# Patient Record
Sex: Male | Born: 1940 | Race: White | Hispanic: No | Marital: Married | Smoking: Never smoker
Health system: Southern US, Community
[De-identification: ages and names within clinical notes are randomized; demographics above are authoritative.]

## PROBLEM LIST (undated history)

## (undated) DIAGNOSIS — R112 Nausea with vomiting, unspecified: Secondary | ICD-10-CM

## (undated) DIAGNOSIS — R51 Headache: Secondary | ICD-10-CM

## (undated) DIAGNOSIS — Z9889 Other specified postprocedural states: Secondary | ICD-10-CM

## (undated) DIAGNOSIS — R413 Other amnesia: Secondary | ICD-10-CM

## (undated) DIAGNOSIS — K219 Gastro-esophageal reflux disease without esophagitis: Secondary | ICD-10-CM

## (undated) DIAGNOSIS — M1990 Unspecified osteoarthritis, unspecified site: Secondary | ICD-10-CM

## (undated) DIAGNOSIS — I219 Acute myocardial infarction, unspecified: Secondary | ICD-10-CM

## (undated) DIAGNOSIS — E119 Type 2 diabetes mellitus without complications: Secondary | ICD-10-CM

## (undated) DIAGNOSIS — Z972 Presence of dental prosthetic device (complete) (partial): Secondary | ICD-10-CM

## (undated) DIAGNOSIS — E785 Hyperlipidemia, unspecified: Secondary | ICD-10-CM

## (undated) DIAGNOSIS — I10 Essential (primary) hypertension: Secondary | ICD-10-CM

## (undated) DIAGNOSIS — R519 Headache, unspecified: Secondary | ICD-10-CM

## (undated) DIAGNOSIS — I2510 Atherosclerotic heart disease of native coronary artery without angina pectoris: Secondary | ICD-10-CM

## (undated) DIAGNOSIS — F0390 Unspecified dementia without behavioral disturbance: Secondary | ICD-10-CM

## (undated) HISTORY — PX: TRIGGER FINGER RELEASE: SHX641

## (undated) HISTORY — PX: ABDOMINAL SURGERY: SHX537

## (undated) HISTORY — PX: JOINT REPLACEMENT: SHX530

## (undated) HISTORY — PX: OTHER SURGICAL HISTORY: SHX169

## (undated) HISTORY — PX: TONSILLECTOMY: SUR1361

---

## 1993-12-03 HISTORY — PX: GANGLION CYST EXCISION: SHX1691

## 2000-12-03 ENCOUNTER — Encounter: Payer: Self-pay | Admitting: Family Medicine

## 2001-12-03 ENCOUNTER — Encounter: Payer: Self-pay | Admitting: Family Medicine

## 2001-12-03 LAB — CONVERTED CEMR LAB
Hgb A1c MFr Bld: 6 %
Microalbumin U total vol: 1.6 mg/L

## 2003-02-01 ENCOUNTER — Encounter: Payer: Self-pay | Admitting: Family Medicine

## 2003-02-01 LAB — CONVERTED CEMR LAB: PSA: 0.4 ng/mL

## 2004-01-04 ENCOUNTER — Encounter: Payer: Self-pay | Admitting: Family Medicine

## 2004-04-02 ENCOUNTER — Encounter: Payer: Self-pay | Admitting: Family Medicine

## 2004-04-02 LAB — CONVERTED CEMR LAB
Hgb A1c MFr Bld: 6.9 %
PSA: 0.4 ng/mL

## 2004-10-17 ENCOUNTER — Ambulatory Visit: Payer: Self-pay | Admitting: Family Medicine

## 2004-10-19 ENCOUNTER — Ambulatory Visit: Payer: Self-pay | Admitting: Family Medicine

## 2004-12-25 ENCOUNTER — Ambulatory Visit: Payer: Self-pay | Admitting: Gastroenterology

## 2005-01-15 ENCOUNTER — Ambulatory Visit: Payer: Self-pay | Admitting: Gastroenterology

## 2005-04-04 ENCOUNTER — Ambulatory Visit: Payer: Self-pay | Admitting: Family Medicine

## 2006-01-31 ENCOUNTER — Ambulatory Visit: Payer: Self-pay | Admitting: Family Medicine

## 2006-03-11 ENCOUNTER — Ambulatory Visit: Payer: Self-pay | Admitting: Family Medicine

## 2006-03-15 ENCOUNTER — Ambulatory Visit: Payer: Self-pay | Admitting: Family Medicine

## 2006-04-02 ENCOUNTER — Encounter: Payer: Self-pay | Admitting: Family Medicine

## 2006-04-30 ENCOUNTER — Ambulatory Visit: Payer: Self-pay | Admitting: Family Medicine

## 2006-05-03 ENCOUNTER — Ambulatory Visit: Payer: Self-pay | Admitting: Family Medicine

## 2006-11-02 ENCOUNTER — Encounter: Payer: Self-pay | Admitting: Family Medicine

## 2006-11-02 LAB — CONVERTED CEMR LAB: Hgb A1c MFr Bld: 7.1 %

## 2006-11-06 ENCOUNTER — Ambulatory Visit: Payer: Self-pay | Admitting: Family Medicine

## 2006-11-06 LAB — CONVERTED CEMR LAB: PSA: 0.32 ng/mL

## 2006-11-08 ENCOUNTER — Ambulatory Visit: Payer: Self-pay | Admitting: Family Medicine

## 2006-12-20 ENCOUNTER — Ambulatory Visit: Payer: Self-pay | Admitting: Family Medicine

## 2007-02-07 ENCOUNTER — Ambulatory Visit: Payer: Self-pay | Admitting: Family Medicine

## 2007-03-10 ENCOUNTER — Ambulatory Visit: Payer: Self-pay | Admitting: Family Medicine

## 2007-03-10 LAB — CONVERTED CEMR LAB
Hgb A1c MFr Bld: 6.8 % — ABNORMAL HIGH (ref 4.6–6.0)
Total CHOL/HDL Ratio: 3.5
Triglycerides: 98 mg/dL (ref 0–149)
VLDL: 20 mg/dL (ref 0–40)

## 2007-03-12 ENCOUNTER — Ambulatory Visit: Payer: Self-pay | Admitting: Family Medicine

## 2007-06-04 ENCOUNTER — Encounter: Payer: Self-pay | Admitting: Family Medicine

## 2007-06-04 DIAGNOSIS — I10 Essential (primary) hypertension: Secondary | ICD-10-CM | POA: Insufficient documentation

## 2007-06-04 DIAGNOSIS — E1139 Type 2 diabetes mellitus with other diabetic ophthalmic complication: Secondary | ICD-10-CM

## 2007-06-04 DIAGNOSIS — E119 Type 2 diabetes mellitus without complications: Secondary | ICD-10-CM | POA: Insufficient documentation

## 2007-06-04 DIAGNOSIS — D6949 Other primary thrombocytopenia: Secondary | ICD-10-CM | POA: Insufficient documentation

## 2007-09-22 ENCOUNTER — Telehealth: Payer: Self-pay | Admitting: Family Medicine

## 2007-11-10 ENCOUNTER — Telehealth: Payer: Self-pay | Admitting: Family Medicine

## 2008-02-26 ENCOUNTER — Ambulatory Visit: Payer: Self-pay | Admitting: Family Medicine

## 2008-02-26 LAB — CONVERTED CEMR LAB
ALT: 34 U/L (ref 0–53)
AST: 27 U/L (ref 0–37)
Albumin: 3.7 g/dL (ref 3.5–5.2)
Alkaline Phosphatase: 50 U/L (ref 39–117)
BUN: 19 mg/dL (ref 6–23)
Basophils Absolute: 0 K/uL (ref 0.0–0.1)
Basophils Relative: 0.6 % (ref 0.0–1.0)
Bilirubin, Direct: 0.1 mg/dL (ref 0.0–0.3)
CO2: 30 meq/L (ref 19–32)
Calcium: 9.1 mg/dL (ref 8.4–10.5)
Chloride: 98 meq/L (ref 96–112)
Cholesterol: 140 mg/dL (ref 0–200)
Creatinine, Ser: 1 mg/dL (ref 0.4–1.5)
Creatinine,U: 152.7 mg/dL
Eosinophils Absolute: 0.2 K/uL (ref 0.0–0.6)
Eosinophils Relative: 4.2 % (ref 0.0–5.0)
GFR calc Af Amer: 96 mL/min
GFR calc non Af Amer: 79 mL/min
Glucose, Bld: 182 mg/dL — ABNORMAL HIGH (ref 70–99)
HCT: 41.3 % (ref 39.0–52.0)
HDL: 29.3 mg/dL — ABNORMAL LOW (ref >39.0)
Hemoglobin: 13.5 g/dL (ref 13.0–17.0)
Hgb A1c MFr Bld: 7.9 % — ABNORMAL HIGH (ref 4.6–6.0)
LDL Cholesterol: 77 mg/dL (ref 0–99)
Lymphocytes Relative: 21.5 % (ref 12.0–46.0)
MCHC: 32.6 g/dL (ref 30.0–36.0)
MCV: 81.7 fL (ref 78.0–100.0)
Microalb Creat Ratio: 19 mg/g (ref 0.0–30.0)
Microalb, Ur: 2.9 mg/dL — ABNORMAL HIGH (ref 0.0–1.9)
Monocytes Absolute: 0.3 K/uL (ref 0.2–0.7)
Monocytes Relative: 7.1 % (ref 3.0–11.0)
Neutro Abs: 2.9 K/uL (ref 1.4–7.7)
Neutrophils Relative %: 66.6 % (ref 43.0–77.0)
PSA: 0.43 ng/mL (ref 0.10–4.00)
Platelets: 114 K/uL — ABNORMAL LOW (ref 150–400)
Potassium: 4.7 meq/L (ref 3.5–5.1)
RBC: 5.06 M/uL (ref 4.22–5.81)
RDW: 13.9 % (ref 11.5–14.6)
Sodium: 133 meq/L — ABNORMAL LOW (ref 135–145)
TSH: 2.81 u[IU]/mL (ref 0.35–5.50)
Total Bilirubin: 0.6 mg/dL (ref 0.3–1.2)
Total CHOL/HDL Ratio: 4.8
Total Protein: 7.1 g/dL (ref 6.0–8.3)
Triglycerides: 169 mg/dL — ABNORMAL HIGH (ref 0–149)
VLDL: 34 mg/dL (ref 0–40)
WBC: 4.3 10*3/microliter — ABNORMAL LOW (ref 4.5–10.5)

## 2008-03-02 ENCOUNTER — Ambulatory Visit: Payer: Self-pay | Admitting: Family Medicine

## 2008-04-06 ENCOUNTER — Telehealth: Payer: Self-pay | Admitting: Family Medicine

## 2008-04-29 ENCOUNTER — Encounter: Payer: Self-pay | Admitting: Family Medicine

## 2008-09-01 ENCOUNTER — Ambulatory Visit: Payer: Self-pay | Admitting: Family Medicine

## 2008-09-01 LAB — CONVERTED CEMR LAB
Albumin: 3.9 g/dL (ref 3.5–5.2)
Alkaline Phosphatase: 76 units/L (ref 39–117)
Bilirubin, Direct: 0.1 mg/dL (ref 0.0–0.3)
Eosinophils Absolute: 0.2 10*3/uL (ref 0.0–0.7)
GFR calc Af Amer: 96 mL/min
GFR calc non Af Amer: 79 mL/min
HCT: 41.1 % (ref 39.0–52.0)
HDL: 23.7 mg/dL — ABNORMAL LOW (ref >39.0)
Hgb A1c MFr Bld: 10.7 % — ABNORMAL HIGH (ref 4.6–6.0)
MCV: 78.5 fL (ref 78.0–100.0)
Microalb Creat Ratio: 2.9 mg/g (ref 0.0–30.0)
Monocytes Absolute: 0.3 10*3/uL (ref 0.1–1.0)
Platelets: 99 10*3/uL — ABNORMAL LOW (ref 150–400)
Potassium: 4.7 meq/L (ref 3.5–5.1)
RDW: 13.8 % (ref 11.5–14.6)
Sodium: 133 meq/L — ABNORMAL LOW (ref 135–145)
TSH: 2.88 microintl units/mL (ref 0.35–5.50)
Triglycerides: 194 mg/dL — ABNORMAL HIGH (ref 0–149)

## 2008-09-06 ENCOUNTER — Ambulatory Visit: Payer: Self-pay | Admitting: Family Medicine

## 2008-10-07 ENCOUNTER — Ambulatory Visit: Payer: Self-pay | Admitting: Family Medicine

## 2008-10-21 ENCOUNTER — Ambulatory Visit: Payer: Self-pay | Admitting: Family Medicine

## 2008-10-21 DIAGNOSIS — M25569 Pain in unspecified knee: Secondary | ICD-10-CM

## 2009-01-13 ENCOUNTER — Ambulatory Visit: Payer: Self-pay | Admitting: Family Medicine

## 2009-01-13 LAB — CONVERTED CEMR LAB: Hgb A1c MFr Bld: 7.2 % — ABNORMAL HIGH (ref 4.6–6.0)

## 2009-01-20 ENCOUNTER — Ambulatory Visit: Payer: Self-pay | Admitting: Family Medicine

## 2009-02-17 ENCOUNTER — Ambulatory Visit: Payer: Self-pay | Admitting: Family Medicine

## 2009-03-08 ENCOUNTER — Encounter: Payer: Self-pay | Admitting: Family Medicine

## 2009-04-04 ENCOUNTER — Telehealth: Payer: Self-pay | Admitting: Family Medicine

## 2009-04-21 ENCOUNTER — Encounter: Payer: Self-pay | Admitting: Family Medicine

## 2009-10-07 ENCOUNTER — Encounter (INDEPENDENT_AMBULATORY_CARE_PROVIDER_SITE_OTHER): Payer: Self-pay | Admitting: *Deleted

## 2009-12-13 ENCOUNTER — Encounter (INDEPENDENT_AMBULATORY_CARE_PROVIDER_SITE_OTHER): Payer: Self-pay | Admitting: *Deleted

## 2010-06-29 ENCOUNTER — Telehealth (INDEPENDENT_AMBULATORY_CARE_PROVIDER_SITE_OTHER): Payer: Self-pay | Admitting: *Deleted

## 2010-07-11 ENCOUNTER — Encounter (INDEPENDENT_AMBULATORY_CARE_PROVIDER_SITE_OTHER): Payer: Self-pay | Admitting: *Deleted

## 2010-07-12 ENCOUNTER — Ambulatory Visit: Payer: Self-pay | Admitting: Gastroenterology
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
## 2011-01-02 NOTE — Letter (Signed)
Summary: Nadara Eaton letter  [facility]  [address] [place], [place] [address]   Phone: [phone]  Fax: [phone]       07/11/2010 MRN: [id number]  Daniel Sherman [address] [place], [place]  [address]  Dear Mr. [name] [place] [place] announce the retirement of Arta Silence, M.D., from full-time practice at the [facility] office effective June 01, 2010 and his plans of returning part-time.  It is important to Dr. Hetty Ely and to our practice that you understand that [facility] has seven physicians in our office for your health care needs.  We will continue to offer the same exceptional care that you have today.    Dr. Hetty Ely has spoken to many of you about his plans for retirement and returning part-time in the fall.   We will continue to work with you through the transition to schedule appointments for you in the office and meet the high standards that [place] is committed to.   Again, it is with great pleasure that we share the news that Dr. Hetty Ely will return to [facility] in October of 2011 with a reduced schedule.    If you have any questions, or would like to request an appointment with one of our physicians, please call us at [phone] and press the option for Scheduling an appointment.  We take pleasure in providing you with excellent patient care and look forward to seeing you at your next office visit.  Our [facility] Physicians are:  Tillman Abide, M.D. Laurita Quint, M.D. Roxy Manns, M.D. Kerby Nora, M.D. Hannah Beat, M.D. Ruthe Mannan, M.D. We proudly welcomed Raechel Ache, M.D. and Eustaquio Boyden, M.D. to the practice in July/August 2011.  Sincerely,   [facility]

## 2011-01-02 NOTE — Letter (Signed)
Summary: Colonoscopy Letter   Gastroenterology  [address] [place], [place] [address]   Phone: [phone]  Fax: [phone]      December 13, 2009 MRN: [id number]   Daniel Sherman [address] [place], [place]  [address]   Dear Mr. Pinera,   According to your medical record, it is time for you to schedule a Colonoscopy. The American Cancer Society recommends this procedure as a method to detect early colon cancer. Patients with a family history of colon cancer, or a personal history of colon polyps or inflammatory bowel disease are at increased risk.  This letter has beeen generated based on the recommendations made at the time of your procedure. If you feel that in your particular situation this may no longer apply, please contact our office.  Please call our office at [phone] to schedule this appointment or to update your records at your earliest convenience.  Thank you for cooperating with [place] to provide you with the very best care possible.   Sincerely,  Judie Petit T. Russella Dar, M.D.  [facility]astro[facility] [phone]

## 2011-01-02 NOTE — Assessment & Plan Note (Signed)
Summary: 2 WEEK FOLLOW UP/[facility]   Vital Signs:  Patient Profile:   70 Years Old Male Height:     66.5 inches Weight:      236 pounds Temp:     97.9 degrees F oral Pulse rate:   88 / minute Pulse rhythm:   regular BP sitting:   124 / 60  (left arm) Cuff size:   large  Vitals Entered By: Providence Crosby (October 21, 2008 8:59 AM)                 Chief Complaint:  2 week followup.  History of Present Illness: Here for two  week followup to check again diabetes after starting insulin. $ day progression of sugars show variation which he realizes is from varied dietary intake and he has a pretty good idea what he should be doing...just needs to be more aggressive with practicing it. He has lost weight and generally feels better as well as has noticed his BP is better. He has been having some left knee pain, off and on...typically in AM. Otherwise feels well.    Prior Medications Reviewed Using: Patient Recall  Current Allergies (reviewed today): TYLENOL (ACETAMINOPHEN)      Physical Exam  General:     Well-developed,well-nourished,in no acute distress; alert,appropriate and cooperative throughout examination Head:     Normocephalic and atraumatic without obvious abnormalities. No apparent alopecia or balding. Eyes:     Conjunctiva clear bilaterally.  Ears:     External ear exam shows no significant lesions or deformities.  Otoscopic examination reveals clear canals, tympanic membranes are intact bilaterally without bulging, retraction, inflammation or discharge. Hearing is grossly normal bilaterally. Nose:     External nasal examination shows no deformity or inflammation. Nasal mucosa are pink and moist without lesions or exudates. Mouth:     Oral mucosa and oropharynx without lesions or exudates.  Teeth in good repair. Neck:     No deformities, masses, or tenderness noted. Chest Wall:     No deformities, masses, tenderness or gynecomastia noted. Lungs:     Normal  respiratory effort, chest expands symmetrically. Lungs are clear to auscultation, no crackles or wheezes. Heart:     Normal rate and regular rhythm. S1 and S2 normal without gallop, murmur, click, rub or other extra sounds. Msk:     L knee with mild discomfort prox patellar area. Unable to reproduce on exam. No swelling, erythema or warmth.    Impression & Recommendations:  Problem # 1:  DIABETES MELLITUS, TYPE II (ICD-250.00) Assessment: Improved Diong better. Try to be more aggressive with diet and continue insulin at current dose. His updated medication list for this problem includes:    Metformin Hcl 1000 Mg Tabs (Metformin hcl) .[name]... 1 tab in am, 1/2 tab at night.    Lisinopril 40 Mg Tabs (Lisinopril) .[name]... Take 1 tablet by mouth every night    Aspirin Ec 325 Mg Tbec (Aspirin) .[name]... Take one by mouth daily    Humulin 70/30 70-30 % Susp (Insulin isophane & regular) .Marland KitchenMarland KitchenMarland KitchenMarland Kitchen 10units subcutaneously bid  Labs Reviewed: HgBA1c: 10.7 (09/01/2008)   Creat: 1.0 (09/01/2008)   Microalbumin: 0.4 (09/01/2008)  Last Eye Exam: diabetic retinopathy (04/29/2008)   Problem # 2:  HYPERTENSION (ICD-401.9) Assessment: Unchanged Stable and well comntrolled....continue. His updated medication list for this problem includes:    Hydrochlorothiazide 12.5 Mg Tabs (Hydrochlorothiazide) .[name]... Take 1 tablet by mouth every morning    Lisinopril 40 Mg Tabs (Lisinopril) .[name]... Take 1 tablet by  mouth every night  BP today: 124/60 Prior BP: 120/60 (10/07/2008)  Labs Reviewed: Creat: 1.0 (09/01/2008) Chol: 100 (09/01/2008)   HDL: 23.7 (09/01/2008)   LDL: 38 (09/01/2008)   TG: 194 (09/01/2008)   Problem # 3:  KNEE PAIN, LEFT (ZOX-096.04) Assessment: New Prob patellar costochondritis....try SLRs as discussed. His updated medication list for this problem includes:    Aspirin Ec 325 Mg Tbec (Aspirin) .[name]... Take one by mouth daily   Complete Medication List: 1)  Hydrochlorothiazide 12.5 Mg Tabs  (Hydrochlorothiazide) .... Take 1 tablet by mouth every morning 2)  Metformin Hcl 1000 Mg Tabs (Metformin hcl) .[name].. 1 tab in am, 1/2 tab at night. 3)  Lisinopril 40 Mg Tabs (Lisinopril) .... Take 1 tablet by mouth every night 4)  Amitriptyline Hcl 50 Mg Tabs (Amitriptyline hcl) .[name].. 1 tablet at bedtime by mouth 5)  Simvastatin 80 Mg Tabs (Simvastatin) .... Take one by mouth daily 6)  Aspirin Ec 325 Mg Tbec (Aspirin) .... Take one by mouth daily 7)  Humulin 70/30 70-30 % Susp (Insulin isophane & regular) .[name].. 10units subcutaneously bid 8)  Onetouch Test Strp (Glucose blood) .... Check daily and as needed   Patient Instructions: 1)  RTC 3 mos, A1C prior   ]

## 2011-01-02 NOTE — Progress Notes (Signed)
Summary: Records  Phone Note Other Incoming   Request: Send information Summary of Call: Request for records received from [facility]. Request forwarded to [facility].   Follow-up for Phone Call        Noted Follow-up by: Christie Nottingham CMA Duncan Dull),  June 29, 2010 10:56 AM

## 2011-02-17 ENCOUNTER — Ambulatory Visit: Payer: Self-pay | Admitting: Specialist

## 2013-11-23 ENCOUNTER — Encounter: Payer: Self-pay | Admitting: Specialist

## 2013-11-24 ENCOUNTER — Ambulatory Visit: Payer: Self-pay | Admitting: Unknown Physician Specialty

## 2013-12-03 ENCOUNTER — Encounter: Payer: Self-pay | Admitting: Specialist

## 2013-12-30 ENCOUNTER — Ambulatory Visit: Payer: Self-pay | Admitting: Gastroenterology

## 2014-01-08 ENCOUNTER — Ambulatory Visit: Payer: Self-pay | Admitting: Gastroenterology

## 2014-06-09 ENCOUNTER — Ambulatory Visit: Payer: Self-pay | Admitting: Gastroenterology

## 2014-06-11 DIAGNOSIS — R0789 Other chest pain: Secondary | ICD-10-CM | POA: Insufficient documentation

## 2014-06-22 ENCOUNTER — Ambulatory Visit: Payer: Self-pay | Admitting: Gastroenterology

## 2014-06-22 HISTORY — PX: COLONOSCOPY WITH ESOPHAGOGASTRODUODENOSCOPY (EGD): SHX5779

## 2014-06-24 LAB — PATHOLOGY REPORT
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
## 2015-02-08 NOTE — Patient Instructions (Addendum)
Sanjiv Castore[facility]  02/08/2015   Your procedure is scheduled on:    02/15/2015    Report to [facility] Main  Entrance and follow signs to               [place] at      0830 AM.  Call this number if you have problems the morning of surgery [phone]   Remember: Eat a good healthy snack prior to bedtime.   Do not eat food or drink liquids :After Midnight.     Take these medicines the morning of surgery with A SIP OF WATER: Prilosec               Take 1/2 of evening dose of Insulin nite before surgery.                                 You may not have any metal on your body including hair pins and              piercings  Do not wear jewelry,  lotions, powders or perfumes., deodorant.                            Men may shave face and neck.   Do not bring valuables to the hospital. [place].  Contacts, dentures or bridgework may not be worn into surgery.  Leave suitcase in the car. After surgery it may be brought to your room.         Special Instructions: coughing and deep breathing exercises, leg exercises               Please read over the following fact sheets you were given: _____________________________________________________________________             [facility] - Preparing for Surgery Before surgery, you can play an important role.  Because skin is not sterile, your skin needs to be as free of germs as possible.  You can reduce the number of germs on your skin by washing with CHG (chlorahexidine gluconate) soap before surgery.  CHG is an antiseptic cleaner which kills germs and bonds with the skin to continue killing germs even after washing. Please DO NOT use if you have an allergy to CHG or antibacterial soaps.  If your skin becomes reddened/irritated stop using the CHG and inform your nurse when you arrive at Short Stay. Do not shave (including legs and underarms) for at least 48 hours prior  to the first CHG shower.  You may shave your face/neck. Please follow these instructions carefully:  1.  Shower with CHG Soap the night before surgery and the  morning of Surgery.  2.  If you choose to wash your hair, wash your hair first as usual with your  normal  shampoo.  3.  After you shampoo, rinse your hair and body thoroughly to remove the  shampoo.                           4.  Use CHG as you would any other liquid soap.  You can apply chg directly  to the skin and wash  Gently with a scrungie or clean washcloth.  5.  Apply the CHG Soap to your body ONLY FROM THE NECK DOWN.   Do not use on face/ open                           Wound or open sores. Avoid contact with eyes, ears mouth and genitals (private parts).                       Wash face,  Genitals (private parts) with your normal soap.             6.  Wash thoroughly, paying special attention to the area where your surgery  will be performed.  7.  Thoroughly rinse your body with warm water from the neck down.  8.  DO NOT shower/wash with your normal soap after using and rinsing off  the CHG Soap.                9.  Pat yourself dry with a clean towel.            10.  Wear clean pajamas.            11.  Place clean sheets on your bed the night of your first shower and do not  sleep with pets. Day of Surgery : Do not apply any lotions/deodorants the morning of surgery.  Please wear clean clothes to the hospital/surgery center.  FAILURE TO FOLLOW THESE INSTRUCTIONS MAY RESULT IN THE CANCELLATION OF YOUR SURGERY PATIENT SIGNATURE_________________________________  NURSE SIGNATURE__________________________________  ________________________________________________________________________  WHAT IS A BLOOD TRANSFUSION? Blood Transfusion Information  A transfusion is the replacement of blood or some of its parts. Blood is made up of multiple cells which provide different functions.  Red blood cells carry oxygen  and are used for blood loss replacement.  White blood cells fight against infection.  Platelets control bleeding.  Plasma helps clot blood.  Other blood products are available for specialized needs, such as hemophilia or other clotting disorders. BEFORE THE TRANSFUSION  Who gives blood for transfusions?   Healthy volunteers who are fully evaluated to make sure their blood is safe. This is blood bank blood. Transfusion therapy is the safest it has ever been in the practice of medicine. Before blood is taken from a donor, a complete history is taken to make sure that person has no history of diseases nor engages in risky social behavior (examples are intravenous drug use or sexual activity with multiple partners). The donor's travel history is screened to minimize risk of transmitting infections, such as malaria. The donated blood is tested for signs of infectious diseases, such as HIV and hepatitis. The blood is then tested to be sure it is compatible with you in order to minimize the chance of a transfusion reaction. If you or a relative donates blood, this is often done in anticipation of surgery and is not appropriate for emergency situations. It takes many days to process the donated blood. RISKS AND COMPLICATIONS Although transfusion therapy is very safe and saves many lives, the main dangers of transfusion include:  1. Getting an infectious disease. 2. Developing a transfusion reaction. This is an allergic reaction to something in the blood you were given. Every precaution is taken to prevent this. The decision to have a blood transfusion has been considered carefully by your caregiver before blood is given. Blood is not given unless the benefits outweigh  the risks. AFTER THE TRANSFUSION  Right after receiving a blood transfusion, you will usually feel much better and more energetic. This is especially true if your red blood cells have gotten low (anemic). The transfusion raises the level  of the red blood cells which carry oxygen, and this usually causes an energy increase.  The nurse administering the transfusion will monitor you carefully for complications. HOME CARE INSTRUCTIONS  No special instructions are needed after a transfusion. You may find your energy is better. Speak with your caregiver about any limitations on activity for underlying diseases you may have. SEEK MEDICAL CARE IF:   Your condition is not improving after your transfusion.  You develop redness or irritation at the intravenous (IV) site. SEEK IMMEDIATE MEDICAL CARE IF:  Any of the following symptoms occur over the next 12 hours:  Shaking chills.  You have a temperature by mouth above 102 F (38.9 C), not controlled by medicine.  Chest, back, or muscle pain.  People around you feel you are not acting correctly or are confused.  Shortness of breath or difficulty breathing.  Dizziness and fainting.  You get a rash or develop hives.  You have a decrease in urine output.  Your urine turns a dark color or changes to pink, red, or brown. Any of the following symptoms occur over the next 10 days:  You have a temperature by mouth above 102 F (38.9 C), not controlled by medicine.  Shortness of breath.  Weakness after normal activity.  The white part of the eye turns yellow (jaundice).  You have a decrease in the amount of urine or are urinating less often.  Your urine turns a dark color or changes to pink, red, or brown. Document Released: 11/16/2000 Document Revised: 02/11/2012 Document Reviewed: 07/05/2008 ExitCare Patient Information 2014 [place].  _______________________________________________________________________  Incentive Spirometer  An incentive spirometer is a tool that can help keep your lungs clear and active. This tool measures how well you are filling your lungs with each breath. Taking long deep breaths may help reverse or decrease the chance of developing  breathing (pulmonary) problems (especially infection) following:  A long period of time when you are unable to move or be active. BEFORE THE PROCEDURE   If the spirometer includes an indicator to show your best effort, your nurse or respiratory therapist will set it to a desired goal.  If possible, sit up straight or lean slightly forward. Try not to slouch.  Hold the incentive spirometer in an upright position. INSTRUCTIONS FOR USE  3. Sit on the edge of your bed if possible, or sit up as far as you can in bed or on a chair. 4. Hold the incentive spirometer in an upright position. 5. Breathe out normally. 6. Place the mouthpiece in your mouth and seal your lips tightly around it. 7. Breathe in slowly and as deeply as possible, raising the piston or the ball toward the top of the column. 8. Hold your breath for 3-5 seconds or for as long as possible. Allow the piston or ball to fall to the bottom of the column. 9. Remove the mouthpiece from your mouth and breathe out normally. 10. Rest for a few seconds and repeat Steps 1 through 7 at least 10 times every 1-2 hours when you are awake. Take your time and take a few normal breaths between deep breaths. 11. The spirometer may include an indicator to show your best effort. Use the indicator as a goal to work  toward during each repetition. 12. After each set of 10 deep breaths, practice coughing to be sure your lungs are clear. If you have an incision (the cut made at the time of surgery), support your incision when coughing by placing a pillow or rolled up towels firmly against it. Once you are able to get out of bed, walk around indoors and cough well. You may stop using the incentive spirometer when instructed by your caregiver.  RISKS AND COMPLICATIONS  Take your time so you do not get dizzy or light-headed.  If you are in pain, you may need to take or ask for pain medication before doing incentive spirometry. It is harder to take a deep  breath if you are having pain. AFTER USE  Rest and breathe slowly and easily.  It can be helpful to keep track of a log of your progress. Your caregiver can provide you with a simple table to help with this. If you are using the spirometer at home, follow these instructions: [place] IF:   You are having difficultly using the spirometer.  You have trouble using the spirometer as often as instructed.  Your pain medication is not giving enough relief while using the spirometer.  You develop fever of 100.5 F (38.1 C) or higher. SEEK IMMEDIATE MEDICAL CARE IF:   You cough up bloody sputum that had not been present before.  You develop fever of 102 F (38.9 C) or greater.  You develop worsening pain at or near the incision site. MAKE SURE YOU:   Understand these instructions.  Will watch your condition.  Will get help right away if you are not doing well or get worse. Document Released: 04/01/2007 Document Revised: 02/11/2012 Document Reviewed: 06/02/2007 [facility] Patient Information 2014 [place], [place].   ________________________________________________________________________

## 2015-02-09 ENCOUNTER — Encounter (HOSPITAL_COMMUNITY): Payer: Self-pay

## 2015-02-09 ENCOUNTER — Encounter (HOSPITAL_COMMUNITY)
Admission: RE | Admit: 2015-02-09 | Discharge: 2015-02-09 | Disposition: A | Discharge status: Home / Self Care | Payer: Medicare PPO | Source: Ambulatory Visit | Attending: Orthopedic Surgery | Admitting: Orthopedic Surgery

## 2015-02-09 DIAGNOSIS — M1612 Unilateral primary osteoarthritis, left hip: Secondary | ICD-10-CM | POA: Diagnosis not present

## 2015-02-09 DIAGNOSIS — Z01818 Encounter for other preprocedural examination: Secondary | ICD-10-CM | POA: Insufficient documentation

## 2015-02-09 HISTORY — DX: Gastro-esophageal reflux disease without esophagitis: K21.9

## 2015-02-09 HISTORY — DX: Acute myocardial infarction, unspecified: I21.9

## 2015-02-09 HISTORY — DX: Other specified postprocedural states: Z98.890

## 2015-02-09 HISTORY — DX: Unspecified osteoarthritis, unspecified site: M19.90

## 2015-02-09 HISTORY — DX: Other specified postprocedural states: R11.2

## 2015-02-09 HISTORY — DX: Type 2 diabetes mellitus without complications: E11.9

## 2015-02-09 HISTORY — DX: Essential (primary) hypertension: I10

## 2015-02-09 LAB — SURGICAL PCR SCREEN
MRSA, PCR: NEGATIVE
Staphylococcus aureus: POSITIVE — AB

## 2015-02-09 LAB — URINALYSIS, ROUTINE W REFLEX MICROSCOPIC
Glucose, UA: NEGATIVE mg/dL
Hgb urine dipstick: NEGATIVE
Ketones, ur: NEGATIVE mg/dL
NITRITE: NEGATIVE
Protein, ur: NEGATIVE mg/dL
SPECIFIC GRAVITY, URINE: 1.019 (ref 1.005–1.030)
Urobilinogen, UA: 1 mg/dL (ref 0.0–1.0)
pH: 5.5 (ref 5.0–8.0)

## 2015-02-09 LAB — CBC
HCT: 42.4 % (ref 39.0–52.0)
Hemoglobin: 13.9 g/dL (ref 13.0–17.0)
MCH: 27.2 pg (ref 26.0–34.0)
MCHC: 32.8 g/dL (ref 30.0–36.0)
MCV: 83 fL (ref 78.0–100.0)
PLATELETS: 105 10*3/uL — AB (ref 150–400)
RBC: 5.11 MIL/uL (ref 4.22–5.81)
RDW: 14.2 % (ref 11.5–15.5)
WBC: 5.5 10*3/uL (ref 4.0–10.5)

## 2015-02-09 LAB — BASIC METABOLIC PANEL
Anion gap: 9 (ref 5–15)
BUN: 23 mg/dL (ref 6–23)
CO2: 27 mmol/L (ref 19–32)
CREATININE: 1.35 mg/dL (ref 0.50–1.35)
Calcium: 9.1 mg/dL (ref 8.4–10.5)
Chloride: 100 mmol/L (ref 96–112)
GFR calc Af Amer: 59 mL/min — ABNORMAL LOW (ref >90)
GFR calc non Af Amer: 50 mL/min — ABNORMAL LOW (ref >90)
Glucose, Bld: 103 mg/dL — ABNORMAL HIGH (ref 70–99)
Potassium: 4.5 mmol/L (ref 3.5–5.1)
Sodium: 136 mmol/L (ref 135–145)

## 2015-02-09 LAB — APTT: aPTT: 33 seconds (ref 24–37)

## 2015-02-09 LAB — PROTIME-INR
INR: 0.96 (ref 0.00–1.49)
Prothrombin Time: 12.9 seconds (ref 11.6–15.2)

## 2015-02-09 LAB — URINE MICROSCOPIC-ADD ON

## 2015-02-09 LAB — NO BLOOD PRODUCTS

## 2015-02-09 NOTE — Progress Notes (Signed)
Blood Product Refusal placed under FYI in EPIC  Blood Product Refusal Consent faxed to Blood bank and confirmation received.  Blood Product Refusal placed under Allergies in EPIC.   Blood Product Refusal Consent along with Health Care Directive faxed to Dr [name] and confirmation received.

## 2015-02-09 NOTE — Progress Notes (Signed)
Dr Kary Kos- 01/31/15 clearance on chart  LOV 01/31/15 on chart  DDR Bartholome Bill- 01/27/2015 clearance on chart  EKG- 06/11/2014 on chart  Stress Test 06/23/2014 on chart

## 2015-02-09 NOTE — Progress Notes (Signed)
CBC, U/A and micro results done 02/09/2015 faxed via EPIC to Dr Alvan Dame.

## 2015-02-14 NOTE — Anesthesia Preprocedure Evaluation (Addendum)
Anesthesia Evaluation  Patient identified by MRN, date of birth, ID band Patient awake    Reviewed: Allergy & Precautions, NPO status , Patient's Chart, lab work & pertinent test results  History of Anesthesia Complications (+) PONV and history of anesthetic complications  Airway Mallampati: I  TM Distance: >3 FB Neck ROM: Full    Dental  (+) Edentulous Upper, Edentulous Lower   Pulmonary neg pulmonary ROS,    Pulmonary exam normal       Cardiovascular hypertension, + Past MI     Neuro/Psych negative neurological ROS  negative psych ROS   GI/Hepatic Neg liver ROS, GERD-  Medicated,  Endo/Other  diabetes  Renal/GU negative Renal ROS     Musculoskeletal  (+) Arthritis -,   Abdominal   Peds  Hematology   Anesthesia Other Findings   Reproductive/Obstetrics                           Anesthesia Physical Anesthesia Plan  ASA: III  Anesthesia Plan: MAC and Spinal   Post-op Pain Management:    Induction:   Airway Management Planned:   Additional Equipment:   Intra-op Plan:   Post-operative Plan:   Informed Consent: I have reviewed the patients History and Physical, chart, labs and discussed the procedure including the risks, benefits and alternatives for the proposed anesthesia with the patient or authorized representative who has indicated his/her understanding and acceptance.     Plan Discussed with: CRNA, Anesthesiologist and Surgeon  Anesthesia Plan Comments:        Anesthesia Quick Evaluation

## 2015-02-15 ENCOUNTER — Inpatient Hospital Stay (HOSPITAL_COMMUNITY): Payer: Medicare PPO | Admitting: Anesthesiology

## 2015-02-15 ENCOUNTER — Inpatient Hospital Stay (HOSPITAL_COMMUNITY): Payer: Medicare PPO

## 2015-02-15 ENCOUNTER — Encounter (HOSPITAL_COMMUNITY)
Admission: RE | Disposition: A | Discharge status: Home Health | Payer: Self-pay | Source: Ambulatory Visit | Attending: Orthopedic Surgery

## 2015-02-15 ENCOUNTER — Encounter (HOSPITAL_COMMUNITY): Payer: Self-pay | Admitting: Anesthesiology

## 2015-02-15 ENCOUNTER — Inpatient Hospital Stay (HOSPITAL_COMMUNITY)
Admission: RE | Admit: 2015-02-15 | Discharge: 2015-02-16 | DRG: 470 | Disposition: A | Discharge status: Home Health | Payer: Medicare PPO | Source: Ambulatory Visit | Attending: Orthopedic Surgery | Admitting: Orthopedic Surgery

## 2015-02-15 DIAGNOSIS — I10 Essential (primary) hypertension: Secondary | ICD-10-CM | POA: Diagnosis present

## 2015-02-15 DIAGNOSIS — K219 Gastro-esophageal reflux disease without esophagitis: Secondary | ICD-10-CM | POA: Diagnosis present

## 2015-02-15 DIAGNOSIS — Z6833 Body mass index (BMI) 33.0-33.9, adult: Secondary | ICD-10-CM

## 2015-02-15 DIAGNOSIS — M1612 Unilateral primary osteoarthritis, left hip: Secondary | ICD-10-CM | POA: Diagnosis present

## 2015-02-15 DIAGNOSIS — Z87891 Personal history of nicotine dependence: Secondary | ICD-10-CM

## 2015-02-15 DIAGNOSIS — Z96642 Presence of left artificial hip joint: Secondary | ICD-10-CM

## 2015-02-15 DIAGNOSIS — E11319 Type 2 diabetes mellitus with unspecified diabetic retinopathy without macular edema: Secondary | ICD-10-CM | POA: Diagnosis present

## 2015-02-15 DIAGNOSIS — M25552 Pain in left hip: Secondary | ICD-10-CM | POA: Diagnosis present

## 2015-02-15 DIAGNOSIS — I252 Old myocardial infarction: Secondary | ICD-10-CM | POA: Diagnosis not present

## 2015-02-15 DIAGNOSIS — Z01812 Encounter for preprocedural laboratory examination: Secondary | ICD-10-CM

## 2015-02-15 DIAGNOSIS — E669 Obesity, unspecified: Secondary | ICD-10-CM | POA: Diagnosis present

## 2015-02-15 DIAGNOSIS — Z96649 Presence of unspecified artificial hip joint: Secondary | ICD-10-CM

## 2015-02-15 HISTORY — PX: TOTAL HIP ARTHROPLASTY: SHX124

## 2015-02-15 LAB — GLUCOSE, CAPILLARY
GLUCOSE-CAPILLARY: 121 mg/dL — AB (ref 70–99)
GLUCOSE-CAPILLARY: 125 mg/dL — AB (ref 70–99)
Glucose-Capillary: 191 mg/dL — ABNORMAL HIGH (ref 70–99)
Glucose-Capillary: 142 mg/dL — ABNORMAL HIGH (ref 70–99)

## 2015-02-15 SURGERY — ARTHROPLASTY, HIP, TOTAL, ANTERIOR APPROACH
Anesthesia: Monitor Anesthesia Care | Site: Hip | Laterality: Left

## 2015-02-15 MED ORDER — DEXAMETHASONE SODIUM PHOSPHATE 10 MG/ML IJ SOLN
10.0000 mg | Freq: Once | INTRAMUSCULAR | Status: AC
  Administered 2015-02-15: 10 mg via INTRAVENOUS

## 2015-02-15 MED ORDER — TRANEXAMIC ACID 100 MG/ML IV SOLN
2000.0000 mg | INTRAVENOUS | Status: DC | PRN
  Administered 2015-02-15: 2000 mg via TOPICAL

## 2015-02-15 MED ORDER — HYDROCHLOROTHIAZIDE 12.5 MG PO CAPS
12.5000 mg | ORAL_CAPSULE | Freq: Every day | ORAL | Status: DC
  Administered 2015-02-15: 12.5 mg via ORAL
  Filled 2015-02-15 (×2): qty 1

## 2015-02-15 MED ORDER — CEFAZOLIN SODIUM-DEXTROSE 2-3 GM-% IV SOLR
INTRAVENOUS | Status: AC
Start: 2015-02-15 — End: 2015-02-15
  Filled 2015-02-15: qty 50

## 2015-02-15 MED ORDER — DOCUSATE SODIUM 100 MG PO CAPS
100.0000 mg | ORAL_CAPSULE | Freq: Two times a day (BID) | ORAL | Status: DC
  Administered 2015-02-15 – 2015-02-16 (×2): 100 mg via ORAL

## 2015-02-15 MED ORDER — METHOCARBAMOL 500 MG PO TABS: 500.0000 mg | ORAL_TABLET | Freq: Four times a day (QID) | ORAL | Status: DC | PRN

## 2015-02-15 MED ORDER — METOCLOPRAMIDE HCL 10 MG PO TABS: 5.0000 mg | ORAL_TABLET | Freq: Three times a day (TID) | ORAL | Status: DC | PRN

## 2015-02-15 MED ORDER — OXYCODONE HCL 5 MG/5ML PO SOLN: 5.0000 mg | Freq: Once | ORAL | Status: DC | PRN

## 2015-02-15 MED ORDER — INSULIN ASPART 100 UNIT/ML ~~LOC~~ SOLN
0.0000 [IU] | Freq: Three times a day (TID) | SUBCUTANEOUS | Status: DC
  Administered 2015-02-15: 3 [IU] via SUBCUTANEOUS
  Administered 2015-02-16: 2 [IU] via SUBCUTANEOUS

## 2015-02-15 MED ORDER — CEFAZOLIN SODIUM-DEXTROSE 2-3 GM-% IV SOLR
2.0000 g | INTRAVENOUS | Status: AC
  Administered 2015-02-15: 2 g via INTRAVENOUS

## 2015-02-15 MED ORDER — SODIUM CHLORIDE 0.9 % IJ SOLN
INTRAMUSCULAR | Status: AC
  Filled 2015-02-15: qty 10

## 2015-02-15 MED ORDER — PANTOPRAZOLE SODIUM 40 MG PO TBEC
40.0000 mg | DELAYED_RELEASE_TABLET | Freq: Every day | ORAL | Status: DC
  Administered 2015-02-16: 40 mg via ORAL
  Filled 2015-02-15: qty 1

## 2015-02-15 MED ORDER — ALUM & MAG HYDROXIDE-SIMETH 200-200-20 MG/5ML PO SUSP
30.0000 mL | ORAL | Status: DC | PRN
Start: 2015-02-15 — End: 2015-02-16

## 2015-02-15 MED ORDER — MIDAZOLAM HCL 2 MG/2ML IJ SOLN
INTRAMUSCULAR | Status: AC
Start: 2015-02-15 — End: 2015-02-15
  Filled 2015-02-15: qty 2

## 2015-02-15 MED ORDER — MENTHOL 3 MG MT LOZG
1.0000 | LOZENGE | OROMUCOSAL | Status: DC | PRN
  Filled 2015-02-15: qty 9

## 2015-02-15 MED ORDER — FENTANYL CITRATE 0.05 MG/ML IJ SOLN
INTRAMUSCULAR | Status: AC
  Filled 2015-02-15: qty 2

## 2015-02-15 MED ORDER — PROMETHAZINE HCL 25 MG/ML IJ SOLN: 6.2500 mg | INTRAMUSCULAR | Status: DC | PRN

## 2015-02-15 MED ORDER — MAGNESIUM CITRATE PO SOLN: 1.0000 | Freq: Once | ORAL | Status: AC | PRN

## 2015-02-15 MED ORDER — PHENYLEPHRINE HCL 10 MG/ML IJ SOLN
10.0000 mg | INTRAMUSCULAR | Status: DC | PRN
  Administered 2015-02-15: 50 ug/min via INTRAVENOUS

## 2015-02-15 MED ORDER — PHENOL 1.4 % MT LIQD: 1.0000 | OROMUCOSAL | Status: DC | PRN

## 2015-02-15 MED ORDER — PHENYLEPHRINE HCL 10 MG/ML IJ SOLN
INTRAMUSCULAR | Status: DC | PRN
  Administered 2015-02-15: 40 ug via INTRAVENOUS
  Administered 2015-02-15: 80 ug via INTRAVENOUS
  Administered 2015-02-15 (×2): 40 ug via INTRAVENOUS

## 2015-02-15 MED ORDER — HYDROMORPHONE HCL 1 MG/ML IJ SOLN
0.2500 mg | INTRAMUSCULAR | Status: DC | PRN
  Administered 2015-02-15: 0.5 mg via INTRAVENOUS
  Administered 2015-02-15 (×2): 0.25 mg via INTRAVENOUS

## 2015-02-15 MED ORDER — METOCLOPRAMIDE HCL 5 MG/ML IJ SOLN: 5.0000 mg | Freq: Three times a day (TID) | INTRAMUSCULAR | Status: DC | PRN

## 2015-02-15 MED ORDER — HYDROCODONE-ACETAMINOPHEN 7.5-325 MG PO TABS
1.0000 | ORAL_TABLET | ORAL | Status: DC
  Administered 2015-02-15 (×2): 1 via ORAL
  Administered 2015-02-15 – 2015-02-16 (×5): 2 via ORAL
  Filled 2015-02-15: qty 2
  Filled 2015-02-15 (×2): qty 1
  Filled 2015-02-15 (×4): qty 2

## 2015-02-15 MED ORDER — BISACODYL 10 MG RE SUPP: 10.0000 mg | Freq: Every day | RECTAL | Status: DC | PRN

## 2015-02-15 MED ORDER — FENTANYL CITRATE 0.05 MG/ML IJ SOLN
INTRAMUSCULAR | Status: DC | PRN
  Administered 2015-02-15: 50 ug via INTRAVENOUS

## 2015-02-15 MED ORDER — DIPHENHYDRAMINE HCL 25 MG PO CAPS: 25.0000 mg | ORAL_CAPSULE | Freq: Four times a day (QID) | ORAL | Status: DC | PRN

## 2015-02-15 MED ORDER — HYDROCHLOROTHIAZIDE 25 MG PO TABS
12.5000 mg | ORAL_TABLET | Freq: Every evening | ORAL | Status: DC
  Filled 2015-02-15: qty 0.5

## 2015-02-15 MED ORDER — LACTATED RINGERS IV SOLN
INTRAVENOUS | Status: DC | PRN
  Administered 2015-02-15 (×3): via INTRAVENOUS

## 2015-02-15 MED ORDER — SODIUM CHLORIDE 0.9 % IV SOLN
INTRAVENOUS | Status: DC
  Administered 2015-02-15: 15:00:00 via INTRAVENOUS
  Filled 2015-02-15 (×5): qty 1000

## 2015-02-15 MED ORDER — ACETAMINOPHEN 650 MG RE SUPP: 650.0000 mg | Freq: Four times a day (QID) | RECTAL | Status: DC | PRN

## 2015-02-15 MED ORDER — METHOCARBAMOL 1000 MG/10ML IJ SOLN
500.0000 mg | Freq: Four times a day (QID) | INTRAVENOUS | Status: DC | PRN
  Administered 2015-02-15: 500 mg via INTRAVENOUS
  Filled 2015-02-15 (×2): qty 5

## 2015-02-15 MED ORDER — ONDANSETRON HCL 4 MG PO TABS: 4.0000 mg | ORAL_TABLET | Freq: Four times a day (QID) | ORAL | Status: DC | PRN

## 2015-02-15 MED ORDER — LIDOCAINE HCL (CARDIAC) 20 MG/ML IV SOLN
INTRAVENOUS | Status: AC
Start: 2015-02-15 — End: 2015-02-15
  Filled 2015-02-15: qty 5

## 2015-02-15 MED ORDER — BUPIVACAINE IN DEXTROSE 0.75-8.25 % IT SOLN
INTRATHECAL | Status: DC | PRN
  Administered 2015-02-15: 2 mL via INTRATHECAL

## 2015-02-15 MED ORDER — TRANEXAMIC ACID 100 MG/ML IV SOLN
2000.0000 mg | Freq: Once | INTRAVENOUS | Status: DC
  Filled 2015-02-15: qty 20

## 2015-02-15 MED ORDER — ASPIRIN EC 325 MG PO TBEC
325.0000 mg | DELAYED_RELEASE_TABLET | Freq: Two times a day (BID) | ORAL | Status: DC
  Administered 2015-02-16: 325 mg via ORAL
  Filled 2015-02-15 (×3): qty 1

## 2015-02-15 MED ORDER — AMITRIPTYLINE HCL 50 MG PO TABS
50.0000 mg | ORAL_TABLET | Freq: Every evening | ORAL | Status: DC
  Administered 2015-02-15: 50 mg via ORAL
  Filled 2015-02-15 (×2): qty 1

## 2015-02-15 MED ORDER — ZOLPIDEM TARTRATE 5 MG PO TABS: 5.0000 mg | ORAL_TABLET | Freq: Every evening | ORAL | Status: DC | PRN

## 2015-02-15 MED ORDER — FERROUS SULFATE 325 (65 FE) MG PO TABS
325.0000 mg | ORAL_TABLET | Freq: Three times a day (TID) | ORAL | Status: DC
  Administered 2015-02-15 – 2015-02-16 (×3): 325 mg via ORAL
  Filled 2015-02-15 (×5): qty 1

## 2015-02-15 MED ORDER — INSULIN ASPART 100 UNIT/ML ~~LOC~~ SOLN
3.0000 [IU] | Freq: Three times a day (TID) | SUBCUTANEOUS | Status: DC
  Administered 2015-02-16 (×2): 3 [IU] via SUBCUTANEOUS

## 2015-02-15 MED ORDER — INSULIN NPH ISOPHANE & REGULAR (70-30) 100 UNIT/ML ~~LOC~~ SUSP: 5.0000 [IU] | Freq: Two times a day (BID) | SUBCUTANEOUS | Status: DC

## 2015-02-15 MED ORDER — PROPOFOL 10 MG/ML IV BOLUS
INTRAVENOUS | Status: AC
  Filled 2015-02-15: qty 20

## 2015-02-15 MED ORDER — SODIUM CHLORIDE 0.9 % IR SOLN
Status: DC | PRN
  Administered 2015-02-15: 1000 mL

## 2015-02-15 MED ORDER — POLYETHYLENE GLYCOL 3350 17 G PO PACK: 17.0000 g | PACK | Freq: Every day | ORAL | Status: DC | PRN

## 2015-02-15 MED ORDER — HYDROMORPHONE HCL 1 MG/ML IJ SOLN
INTRAMUSCULAR | Status: AC
  Filled 2015-02-15: qty 1

## 2015-02-15 MED ORDER — SODIUM CHLORIDE 0.9 % IV SOLN: 30.0000 ug/min | INTRAVENOUS | Status: DC

## 2015-02-15 MED ORDER — CEFAZOLIN SODIUM-DEXTROSE 2-3 GM-% IV SOLR
2.0000 g | Freq: Four times a day (QID) | INTRAVENOUS | Status: AC
  Administered 2015-02-15 (×2): 2 g via INTRAVENOUS
  Filled 2015-02-15 (×2): qty 50

## 2015-02-15 MED ORDER — ACETAMINOPHEN 325 MG PO TABS: 650.0000 mg | ORAL_TABLET | Freq: Four times a day (QID) | ORAL | Status: DC | PRN

## 2015-02-15 MED ORDER — PROPOFOL INFUSION 10 MG/ML OPTIME
INTRAVENOUS | Status: DC | PRN
  Administered 2015-02-15: 140 ug/kg/min via INTRAVENOUS

## 2015-02-15 MED ORDER — ONDANSETRON HCL 4 MG/2ML IJ SOLN
INTRAMUSCULAR | Status: DC | PRN
  Administered 2015-02-15: 4 mg via INTRAVENOUS

## 2015-02-15 MED ORDER — DEXAMETHASONE SODIUM PHOSPHATE 10 MG/ML IJ SOLN
10.0000 mg | Freq: Once | INTRAMUSCULAR | Status: DC
  Filled 2015-02-15: qty 1

## 2015-02-15 MED ORDER — SIMVASTATIN 40 MG PO TABS
40.0000 mg | ORAL_TABLET | Freq: Every evening | ORAL | Status: DC
  Administered 2015-02-15: 40 mg via ORAL
  Filled 2015-02-15 (×2): qty 1

## 2015-02-15 MED ORDER — CELECOXIB 200 MG PO CAPS
200.0000 mg | ORAL_CAPSULE | Freq: Two times a day (BID) | ORAL | Status: DC
  Administered 2015-02-15 – 2015-02-16 (×2): 200 mg via ORAL
  Filled 2015-02-15 (×3): qty 1

## 2015-02-15 MED ORDER — ACETAMINOPHEN 500 MG PO TABS: 1000.0000 mg | ORAL_TABLET | Freq: Four times a day (QID) | ORAL | Status: DC

## 2015-02-15 MED ORDER — CEFAZOLIN SODIUM-DEXTROSE 2-3 GM-% IV SOLR
2.0000 g | Freq: Four times a day (QID) | INTRAVENOUS | Status: DC
  Filled 2015-02-15 (×2): qty 50

## 2015-02-15 MED ORDER — MIDAZOLAM HCL 5 MG/5ML IJ SOLN
INTRAMUSCULAR | Status: DC | PRN
  Administered 2015-02-15: 2 mg via INTRAVENOUS

## 2015-02-15 MED ORDER — EPHEDRINE SULFATE 50 MG/ML IJ SOLN
INTRAMUSCULAR | Status: AC
  Filled 2015-02-15: qty 1

## 2015-02-15 MED ORDER — CHLORHEXIDINE GLUCONATE 4 % EX LIQD: 60.0000 mL | Freq: Once | CUTANEOUS | Status: DC

## 2015-02-15 MED ORDER — HYDROMORPHONE HCL 1 MG/ML IJ SOLN
0.5000 mg | INTRAMUSCULAR | Status: DC | PRN
  Administered 2015-02-15: 1 mg via INTRAVENOUS
  Administered 2015-02-15: 0.5 mg via INTRAVENOUS
  Filled 2015-02-15 (×2): qty 1

## 2015-02-15 MED ORDER — LACTATED RINGERS IV SOLN: INTRAVENOUS | Status: DC

## 2015-02-15 MED ORDER — INSULIN ASPART PROT & ASPART (70-30 MIX) 100 UNIT/ML ~~LOC~~ SUSP
5.0000 [IU] | Freq: Two times a day (BID) | SUBCUTANEOUS | Status: DC
  Administered 2015-02-16: 5 [IU] via SUBCUTANEOUS
  Filled 2015-02-15: qty 10

## 2015-02-15 MED ORDER — ONDANSETRON HCL 4 MG/2ML IJ SOLN: 4.0000 mg | Freq: Four times a day (QID) | INTRAMUSCULAR | Status: DC | PRN

## 2015-02-15 MED ORDER — METFORMIN HCL 500 MG PO TABS
1000.0000 mg | ORAL_TABLET | Freq: Two times a day (BID) | ORAL | Status: DC
  Administered 2015-02-16: 1000 mg via ORAL
  Filled 2015-02-15 (×3): qty 2

## 2015-02-15 MED ORDER — OXYCODONE HCL 5 MG PO TABS: 5.0000 mg | ORAL_TABLET | Freq: Once | ORAL | Status: DC | PRN

## 2015-02-15 MED ORDER — ONDANSETRON HCL 4 MG/2ML IJ SOLN
INTRAMUSCULAR | Status: AC
  Filled 2015-02-15: qty 2

## 2015-02-15 MED ORDER — PROPOFOL 10 MG/ML IV BOLUS
INTRAVENOUS | Status: DC | PRN
  Administered 2015-02-15 (×2): 20 mg via INTRAVENOUS

## 2015-02-15 MED ORDER — PHENYLEPHRINE HCL 10 MG/ML IJ SOLN
INTRAMUSCULAR | Status: AC
  Filled 2015-02-15: qty 1

## 2015-02-15 MED ORDER — PHENYLEPHRINE 40 MCG/ML (10ML) SYRINGE FOR IV PUSH (FOR BLOOD PRESSURE SUPPORT)
PREFILLED_SYRINGE | INTRAVENOUS | Status: AC
  Filled 2015-02-15: qty 10

## 2015-02-15 MED ORDER — DEXAMETHASONE SODIUM PHOSPHATE 10 MG/ML IJ SOLN
INTRAMUSCULAR | Status: AC
  Filled 2015-02-15: qty 1

## 2015-02-15 SURGICAL SUPPLY — 44 items
BAG DECANTER FOR FLEXI CONT (MISCELLANEOUS) ×2 IMPLANT
BAG SPEC THK2 15X12 ZIP CLS (MISCELLANEOUS)
BAG ZIPLOCK 12X15 (MISCELLANEOUS) IMPLANT
CAPT HIP TOTAL 2 ×2 IMPLANT
COVER PERINEAL POST (MISCELLANEOUS) ×3 IMPLANT
DRAPE C-ARM 42X120 X-RAY (DRAPES) ×3 IMPLANT
DRAPE STERI IOBAN 125X83 (DRAPES) ×3 IMPLANT
DRAPE U-SHAPE 47X51 STRL (DRAPES) ×9 IMPLANT
DRSG AQUACEL AG ADV 3.5X10 (GAUZE/BANDAGES/DRESSINGS) ×3 IMPLANT
DURAPREP 26ML APPLICATOR (WOUND CARE) ×3 IMPLANT
ELECT BLADE TIP CTD 4 INCH (ELECTRODE) ×3 IMPLANT
ELECT PENCIL ROCKER SW 15FT (MISCELLANEOUS) IMPLANT
ELECT REM PT RETURN 15FT ADLT (MISCELLANEOUS) IMPLANT
ELECT REM PT RETURN 9FT ADLT (ELECTROSURGICAL) ×3
ELECTRODE REM PT RTRN 9FT ADLT (ELECTROSURGICAL) ×1 IMPLANT
FACESHIELD WRAPAROUND (MASK) ×12 IMPLANT
FACESHIELD WRAPAROUND OR TEAM (MASK) ×4 IMPLANT
GLOVE BIOGEL PI IND STRL 7.5 (GLOVE) ×1 IMPLANT
GLOVE BIOGEL PI IND STRL 8.5 (GLOVE) ×1 IMPLANT
GLOVE BIOGEL PI INDICATOR 7.5 (GLOVE) ×2
GLOVE BIOGEL PI INDICATOR 8.5 (GLOVE) ×2
GLOVE ECLIPSE 8.0 STRL XLNG CF (GLOVE) ×6 IMPLANT
GLOVE ORTHO TXT STRL SZ7.5 (GLOVE) ×3 IMPLANT
GOWN SPEC L3 XXLG W/TWL (GOWN DISPOSABLE) ×3 IMPLANT
GOWN STRL REUS W/TWL LRG LVL3 (GOWN DISPOSABLE) ×3 IMPLANT
HOLDER FOLEY CATH W/STRAP (MISCELLANEOUS) ×3 IMPLANT
KIT BASIN OR (CUSTOM PROCEDURE TRAY) ×3 IMPLANT
LIQUID BAND (GAUZE/BANDAGES/DRESSINGS) ×3 IMPLANT
NDL SAFETY ECLIPSE 18X1.5 (NEEDLE) IMPLANT
NEEDLE HYPO 18GX1.5 SHARP (NEEDLE) ×3
PACK TOTAL JOINT (CUSTOM PROCEDURE TRAY) ×3 IMPLANT
PEN SKIN MARKING BROAD (MISCELLANEOUS) ×3 IMPLANT
SAW OSC TIP CART 19.5X105X1.3 (SAW) ×3 IMPLANT
SUT MNCRL AB 4-0 PS2 18 (SUTURE) ×3 IMPLANT
SUT VIC AB 1 CT1 36 (SUTURE) ×9 IMPLANT
SUT VIC AB 2-0 CT1 27 (SUTURE) ×6
SUT VIC AB 2-0 CT1 TAPERPNT 27 (SUTURE) ×2 IMPLANT
SUT VLOC 180 0 24IN GS25 (SUTURE) ×3 IMPLANT
SYR 50ML LL SCALE MARK (SYRINGE) IMPLANT
TOWEL OR 17X26 10 PK STRL BLUE (TOWEL DISPOSABLE) ×3 IMPLANT
TOWEL OR NON WOVEN STRL DISP B (DISPOSABLE) ×2 IMPLANT
TRAY FOLEY CATH 16FR SILVER (SET/KITS/TRAYS/PACK) ×2 IMPLANT
WATER STERILE IRR 1500ML POUR (IV SOLUTION) ×3 IMPLANT
YANKAUER SUCT BULB TIP NO VENT (SUCTIONS) ×3 IMPLANT

## 2015-02-15 NOTE — Transfer of Care (Signed)
Immediate Anesthesia Transfer of Care Note  Patient: Daniel Sherman  Procedure(s) Performed: Procedure(s): LEFT TOTAL HIP ARTHROPLASTY ANTERIOR APPROACH (Left)  Patient Location: PACU  Anesthesia Type:Spinal  Level of Consciousness: awake, oriented and patient cooperative  Airway & Oxygen Therapy: Patient Spontanous Breathing and Patient connected to face mask oxygen  Post-op Assessment: Report given to RN and Post -op Vital signs reviewed and stable  Post vital signs: Reviewed and stable  Last Vitals:  Filed Vitals:   02/15/15 0556  BP: 131/63  Pulse: 92  Temp: 36.3 C  Resp: 18    Complications: No apparent anesthesia complications

## 2015-02-15 NOTE — Op Note (Signed)
[name]                ACCOUNT NO.: [id number]      MEDICAL RECORD NO.: [id number]      FACILITY:  [facility]      PHYSICIAN:  [name] D  DATE OF BIRTH:  1940-12-28     DATE OF PROCEDURE:  02/15/2015                                 OPERATIVE REPORT         PREOPERATIVE DIAGNOSIS: Left  hip osteoarthritis.      POSTOPERATIVE DIAGNOSIS:  Left hip osteoarthritis.      PROCEDURE:  Left total hip replacement through an anterior approach   utilizing DePuy THR system, component size 88mm pinnacle cup, a size 36+4 neutral   Altrex liner, a size 8 Hi Tri Lock stem with a 36+5 delta ceramic   ball.      SURGEON:  Pietro Cassis. Alvan Dame, M.D.      ASSISTANT:  Nehemiah Massed, PA-C     ANESTHESIA:  Spinal.      SPECIMENS:  None.      COMPLICATIONS:  None.      BLOOD LOSS:  800 cc     DRAINS:  None.      INDICATION OF THE PROCEDURE:  Daniel Sherman is a 74 y.o. male who had   presented to office for evaluation of left hip pain.  Radiographs revealed   progressive degenerative changes with bone-on-bone   articulation to the  hip joint.  The patient had painful limited range of   motion significantly affecting their overall quality of life.  The patient was failing to    respond to conservative measures, and at this point was ready   to proceed with more definitive measures.  The patient has noted progressive   degenerative changes in his hip, progressive problems and dysfunction   with regarding the hip prior to surgery.  Consent was obtained for   benefit of pain relief.  Specific risk of infection, DVT, component   failure, dislocation, need for revision surgery, as well discussion of   the anterior versus posterior approach were reviewed.  Consent was   obtained for benefit of anterior pain relief through an anterior   approach.      PROCEDURE IN DETAIL:  The patient was brought to operative theater.   Once adequate anesthesia, preoperative  antibiotics, 2gm of Ancef, 10mg  of Decadron administered.   The patient was positioned supine on the OSI Hanna table.  Once adequate   padding of boney process was carried out, we had predraped out the hip, and  used fluoroscopy to confirm orientation of the pelvis and position.      The left hip was then prepped and draped from proximal iliac crest to   mid thigh with shower curtain technique.      Time-out was performed identifying the patient, planned procedure, and   extremity.     An incision was then made 2 cm distal and lateral to the   anterior superior iliac spine extending over the orientation of the   tensor fascia lata muscle and sharp dissection was carried down to the   fascia of the muscle and protractor placed in the soft tissues.      The fascia was then incised.  The muscle belly was  identified and swept   laterally and retractor placed along the superior neck.  Following   cauterization of the circumflex vessels and removing some pericapsular   fat, a second cobra retractor was placed on the inferior neck.  A third   retractor was placed on the anterior acetabulum after elevating the   anterior rectus.  A L-capsulotomy was along the line of the   superior neck to the trochanteric fossa, then extended proximally and   distally.  Tag sutures were placed and the retractors were then placed   intracapsular.  We then identified the trochanteric fossa and   orientation of my neck cut, confirmed this radiographically   and then made a neck osteotomy with the femur on traction.  The femoral   head was removed without difficulty or complication.  Traction was let   off and retractors were placed posterior and anterior around the   acetabulum.      The labrum and foveal tissue were debrided.  I began reaming with a 54mm   reamer and reamed up to 73mm reamer with good bony bed preparation and a 65mm   cup was chosen.  The final 60mm Pinnacle cup was then impacted under  fluoroscopy  to confirm the depth of penetration and orientation with respect to   abduction.  A screw was placed followed by the hole eliminator.  The final   36+4 neutral Altrex liner was impacted with good visualized rim fit.  The cup was positioned anatomically within the acetabular portion of the pelvis.      At this point, the femur was rolled at 80 degrees.  Further capsule was   released off the inferior aspect of the femoral neck.  I then   released the superior capsule proximally.  The hook was placed laterally   along the femur and elevated manually and held in position with the bed   hook.  The leg was then extended and adducted with the leg rolled to 100   degrees of external rotation.  Once the proximal femur was fully   exposed, I used a box osteotome to set orientation.  I then began   broaching with the starting chili pepper broach and passed this by hand and then broached up to 6.  With the 6 broach in place I chose a high offset neck and did a trial reductions first with the +1.5 then the +5.  The offset was appropriate, leg lengths   appeared to be equal best reproduced with the +5 head ball, confirmed radiographically.   Given these findings, I went ahead and dislocated the hip, repositioned all   retractors and positioned the right hip in the extended and abducted position.  The final 6 Hi Tri Lock stem was   chosen and it was impacted down to the level of neck cut.  Based on this   and the trial reduction, a 36+5 delta ceramic ball was chosen and   impacted onto a clean and dry trunnion, and the hip was reduced.  The   hip had been irrigated throughout the case again at this point.  I did   reapproximate the superior capsular leaflet to the anterior leaflet   using #1 Vicryl.  The fascia of the   tensor fascia lata muscle was then reapproximated using #1 Vicryl and #0 V-lock sutures.  After this layer was closed, we injected topical based Tranexamic Acid, 2gm,  submuscular.  The   remaining wound was closed with  2-0 Vicryl and running 4-0 Monocryl.   The hip was cleaned, dried, and dressed sterilely using Dermabond and   Aquacel dressing.  He was then brought   to recovery room in stable condition tolerating the procedure well.    Nehemiah Massed, PA-C was present for the entirety of the case involved from   preoperative positioning, perioperative retractor management, general   facilitation of the case, as well as primary wound closure as assistant.            Pietro Cassis Alvan Dame, M.D.        02/15/2015 10:17 AM

## 2015-02-15 NOTE — Interval H&P Note (Signed)
History and Physical Interval Note:  02/15/2015 6:58 AM  Daniel Sherman  has presented today for surgery, with the diagnosis of LEFT HIP OA  The various methods of treatment have been discussed with the patient and family. After consideration of risks, benefits and other options for treatment, the patient has consented to  Procedure(s): LEFT TOTAL HIP ARTHROPLASTY ANTERIOR APPROACH (Left) as a surgical intervention .  The patient's history has been reviewed, patient examined, no change in status, stable for surgery.  I have reviewed the patient's chart and labs.  Questions were answered to the patient's satisfaction.     [name]

## 2015-02-15 NOTE — Evaluation (Signed)
Physical Therapy Evaluation Patient Details Name: Daniel Sherman MRN: [id number] DOB: 1941-08-10 Today's Date: 02/15/2015   History of Present Illness  s/p LTHA  , hx of HTN , GERD, DM 2.   Clinical Impression  S/p LTHA presents with decreased ability with mobility. To benefit from PT to increase ability to safely transfer home with wife assisting as needed.     Follow Up Recommendations Home health PT    Equipment Recommendations  None recommended by PT    Recommendations for Other Services       Precautions / Restrictions Precautions Precautions: None Restrictions Weight Bearing Restrictions: No      Mobility  Bed Mobility Overal bed mobility: Needs Assistance Bed Mobility: Supine to Sit;Sit to Supine     Supine to sit: Min assist;HOB elevated     General bed mobility comments: used rails and HOB elevated and assist with LLE  Transfers Overall transfer level: Needs assistance Equipment used: Rolling walker (2 wheeled) Transfers: Sit to/from Stand Sit to Stand: +2 safety/equipment;Min assist         General transfer comment: cues for RW safety and hand placement  Ambulation/Gait Ambulation/Gait assistance: Min assist Ambulation Distance (Feet): 45 Feet Assistive device: Rolling walker (2 wheeled) Gait Pattern/deviations: Step-to pattern     General Gait Details: very difficult to assess gait, however seemed L LE with wide BOS and some garding with straight leg. REsponded to VC s to allow for knee flexion, howeever over compensated. will continue to assess  Stairs            Wheelchair Mobility    Modified Rankin (Stroke Patients Only)       Balance Overall balance assessment: No apparent balance deficits (not formally assessed)                                           Pertinent Vitals/Pain Pain Assessment: 0-10 Pain Score: 4  Pain Location: L hip area Pain Descriptors / Indicators: Aching Pain Intervention(s):  Premedicated before session;Ice applied    Home Living Family/patient expects to be discharged to:: Private residence Living Arrangements: Spouse/significant other Available Help at Discharge: Family Type of Home: House Home Access: Stairs to enter Entrance Stairs-Rails: Can reach both Entrance Stairs-Number of Steps: 5-6 Home Layout: One level Home Equipment: [name];Walker - 2 wheels      Prior Function Level of Independence: Independent with assistive device(s)         Comments: used cane recently     Hand Dominance        Extremity/Trunk Assessment               Lower Extremity Assessment: Overall WFL for tasks assessed (slightly limited on L hip are due to recetn surgery today, but overall good strength)         Communication   Communication: No difficulties  Cognition Arousal/Alertness: Awake/alert Behavior During Therapy: WFL for tasks assessed/performed Overall Cognitive Status: Within Functional Limits for tasks assessed                      General Comments      Exercises Total Joint Exercises Ankle Circles/Pumps: AROM;Supine;Left;10 reps Quad Sets: AROM;Left;10 reps;Supine Heel Slides: AAROM;Left;10 reps;Supine Hip ABduction/ADduction: AAROM;Left;10 reps;Supine Straight Leg Raises: AAROM;Left;10 reps;Supine      Assessment/Plan    PT Assessment Patient needs continued PT  services  PT Diagnosis Difficulty walking   PT Problem List Decreased strength;Decreased range of motion;Decreased activity tolerance;Decreased mobility  PT Treatment Interventions DME instruction;Gait training;Stair training;Functional mobility training;Therapeutic activities;Therapeutic exercise;Patient/family education   PT Goals (Current goals can be found in the Care Plan section) Acute Rehab PT Goals Patient Stated Goal: to be able to walk normal again PT Goal Formulation: With patient/family Time For Goal Achievement: 03/01/15 Potential to  Achieve Goals: Good    Frequency 7X/week   Barriers to discharge        Co-evaluation               End of Session Equipment Utilized During Treatment: Gait belt Activity Tolerance: Patient tolerated treatment well Patient left: in chair;with family/visitor present Nurse Communication: Mobility status         Time: 7517-0017 PT Time Calculation (min) (ACUTE ONLY): 32 min   Charges:   PT Evaluation $Initial PT Evaluation Tier I: 1 Procedure PT Treatments $Therapeutic Exercise: 8-22 mins   PT G CodesClide Dales 2015-02-27, 6:40 PM  .Clide Dales, PT Pager: [phone] February 27, 2015

## 2015-02-15 NOTE — Anesthesia Postprocedure Evaluation (Signed)
Anesthesia Post Note  Patient: Daniel Sherman Born  Procedure(s) Performed: Procedure(s) (LRB): LEFT TOTAL HIP ARTHROPLASTY ANTERIOR APPROACH (Left)  Anesthesia type: MAC/SAB  Patient location: PACU  Post pain: Pain level controlled  Post assessment: Patient's Cardiovascular Status Stable  Last Vitals:  Filed Vitals:   02/15/15 1130  BP: 115/63  Pulse: 94  Temp:   Resp: 16    Post vital signs: Reviewed and stable  Level of consciousness: sedated  Complications: No apparent anesthesia complications

## 2015-02-15 NOTE — Anesthesia Procedure Notes (Signed)
Spinal Patient location during procedure: OR Start time: 02/15/2015 8:49 AM End time: 02/15/2015 8:55 AM Staffing Resident/CRNA: Eliu Batch L Performed by: resident/CRNA  Preanesthetic Checklist Completed: patient identified, site marked, surgical consent, pre-op evaluation, timeout performed, IV checked, risks and benefits discussed and monitors and equipment checked Spinal Block Patient position: sitting Prep: Betadine Patient monitoring: heart rate, continuous pulse ox and blood pressure Approach: left paramedian Location: L3-4 Injection technique: single-shot Needle Needle type: Sprotte  Needle gauge: 24 G Needle length: 9 cm Assessment Sensory level: T6 Additional Notes Kit expiration 06/2016 and kit lot #[id number] CSF clear, negative heme, negative paresthesia Tolerated well and returned to supine position

## 2015-02-16 DIAGNOSIS — E669 Obesity, unspecified: Secondary | ICD-10-CM | POA: Diagnosis present

## 2015-02-16 DIAGNOSIS — Z96649 Presence of unspecified artificial hip joint: Secondary | ICD-10-CM

## 2015-02-16 LAB — BASIC METABOLIC PANEL
ANION GAP: 8 (ref 5–15)
BUN: 21 mg/dL (ref 6–23)
CO2: 27 mmol/L (ref 19–32)
Calcium: 8.4 mg/dL (ref 8.4–10.5)
Chloride: 100 mmol/L (ref 96–112)
Creatinine, Ser: 1.4 mg/dL — ABNORMAL HIGH (ref 0.50–1.35)
GFR, EST AFRICAN AMERICAN: 56 mL/min — AB (ref >90)
GFR, EST NON AFRICAN AMERICAN: 48 mL/min — AB (ref >90)
Glucose, Bld: 139 mg/dL — ABNORMAL HIGH (ref 70–99)
Potassium: 4.6 mmol/L (ref 3.5–5.1)
Sodium: 135 mmol/L (ref 135–145)

## 2015-02-16 LAB — HEMOGLOBIN A1C
HEMOGLOBIN A1C: 6 % — AB (ref 4.8–5.6)
MEAN PLASMA GLUCOSE: 126 mg/dL

## 2015-02-16 LAB — CBC
HEMATOCRIT: 28.1 % — AB (ref 39.0–52.0)
Hemoglobin: 9.4 g/dL — ABNORMAL LOW (ref 13.0–17.0)
MCH: 27.3 pg (ref 26.0–34.0)
MCHC: 33.5 g/dL (ref 30.0–36.0)
MCV: 81.7 fL (ref 78.0–100.0)
Platelets: 87 10*3/uL — ABNORMAL LOW (ref 150–400)
RBC: 3.44 MIL/uL — AB (ref 4.22–5.81)
RDW: 14.1 % (ref 11.5–15.5)
WBC: 4.5 10*3/uL (ref 4.0–10.5)

## 2015-02-16 LAB — GLUCOSE, CAPILLARY
GLUCOSE-CAPILLARY: 116 mg/dL — AB (ref 70–99)
Glucose-Capillary: 125 mg/dL — ABNORMAL HIGH (ref 70–99)

## 2015-02-16 MED ORDER — HYDROCODONE-ACETAMINOPHEN 7.5-325 MG PO TABS: 1.0000 | ORAL_TABLET | ORAL | Status: DC | PRN

## 2015-02-16 MED ORDER — POLYETHYLENE GLYCOL 3350 17 G PO PACK: 17.0000 g | PACK | Freq: Every day | ORAL | Status: DC | PRN

## 2015-02-16 MED ORDER — ASPIRIN 325 MG PO TBEC: 325.0000 mg | DELAYED_RELEASE_TABLET | Freq: Two times a day (BID) | ORAL | Status: AC

## 2015-02-16 MED ORDER — DOCUSATE SODIUM 100 MG PO CAPS: 100.0000 mg | ORAL_CAPSULE | Freq: Two times a day (BID) | ORAL | Status: DC

## 2015-02-16 MED ORDER — TIZANIDINE HCL 4 MG PO TABS: 4.0000 mg | ORAL_TABLET | Freq: Four times a day (QID) | ORAL | Status: DC | PRN

## 2015-02-16 MED ORDER — FERROUS SULFATE 325 (65 FE) MG PO TABS: 325.0000 mg | ORAL_TABLET | Freq: Three times a day (TID) | ORAL | Status: DC

## 2015-02-16 NOTE — Progress Notes (Signed)
     Subjective: 1 Day Post-Op Procedure(s) (LRB): LEFT TOTAL HIP ARTHROPLASTY ANTERIOR APPROACH (Left)   Patient reports pain as mild, pain controlled.  No events throughout the night. Feels like his hip is doing well. We discussed his low plts, but he is aware and states that this is a chronic issue, and that is a normal value.   Objective:   VITALS:   Filed Vitals:   02/16/15 0946  BP: 119/56  Pulse: 96  Temp: 97.9 F (36.6 C)  Resp: 16    Dorsiflexion/Plantar flexion intact Incision: dressing C/D/I No cellulitis present Compartment soft  LABS  Recent Labs  02/16/15 0450  HGB 9.4*  HCT 28.1*  WBC 4.5  PLT 87*     Recent Labs  02/16/15 0450  NA 135  K 4.6  BUN 21  CREATININE 1.40*  GLUCOSE 139*     Assessment/Plan: 1 Day Post-Op Procedure(s) (LRB): LEFT TOTAL HIP ARTHROPLASTY ANTERIOR APPROACH (Left) Foley cath d/c'ed Advance diet Up with therapy D/C IV fluids Discharge home with home health if does well with PT Follow up in 2 weeks at [facility]. Follow up with OLIN,Nakeisha Greenhouse D in 2 weeks.  Contact information:  [facility] [address] [place] [phone]    Obese (BMI 30-39.9) Estimated body mass index is 33.1 kg/(m^2) as calculated from the following:   Height as of this encounter: 5\' 6"  (1.676 m).   Weight as of this encounter: 92.987 kg (205 lb). Patient also counseled that weight may inhibit the healing process Patient counseled that losing weight will help with future health issues      [place] Pugh. Bexlee Bergdoll   PAC  02/16/2015, 10:05 AM

## 2015-02-16 NOTE — Progress Notes (Signed)
Physical Therapy Treatment Patient Details Name: Daniel Sherman MRN: [id number] DOB: [birth year] Today's Date: 02/16/2015    History of Present Illness s/p LTHA  , hx of HTN , GERD, DM 2.     PT Comments    Patient continues to have difficulty with control of  LLE during swing and there exercises, decreased control at hip, during heel slides noted to have leg in more ER. Will practice steps this PM  Follow Up Recommendations  Home health PT     Equipment Recommendations  None recommended by PT    Recommendations for Other Services       Precautions / Restrictions Precautions Precautions: Fall    Mobility  Bed Mobility   Bed Mobility: Supine to Sit;Sit to Supine     Supine to sit: Min guard Sit to supine: Min guard   General bed mobility comments: cues for R foot assisting L leg onto bed  Transfers   Equipment used: Rolling walker (2 wheeled) Transfers: Sit to/from Stand Sit to Stand: Min assist         General transfer comment: cues for RW safety and hand placement  Ambulation/Gait Ambulation/Gait assistance: Min assist Ambulation Distance (Feet): 75 Feet Assistive device: Rolling walker (2 wheeled) Gait Pattern/deviations: Step-to pattern     General Gait Details: unsteady gait for starting off and for controlling L leg during swing, multimodal cues for controling swing,  safe turning.   Stairs            Wheelchair Mobility    Modified Rankin (Stroke Patients Only)       Balance Overall balance assessment: Needs assistance         Standing balance support: During functional activity;Bilateral upper extremity supported Standing balance-Leahy Scale: Fair                      Cognition Arousal/Alertness: Awake/alert                          Exercises Total Joint Exercises Ankle Circles/Pumps: AROM;Supine;Left;10 reps Quad Sets: AROM;Left;10 reps;Supine Short Arc Quad: AROM;Left;10 reps;Supine Heel Slides:  AAROM;Left;10 reps;Supine Hip ABduction/ADduction: Left;10 reps;Supine;AROM Straight Leg Raises: AAROM;Left;10 reps;Supine    General Comments        Pertinent Vitals/Pain Pain Score: 1  Pain Location: L thigh, icision Pain Descriptors / Indicators: Sore Pain Intervention(s): Monitored during session;Premedicated before session;Ice applied    Home Living                      Prior Function            PT Goals (current goals can now be found in the care plan section) Progress towards PT goals: Progressing toward goals    Frequency  7X/week    PT Plan Current plan remains appropriate    Co-evaluation             End of Session   Activity Tolerance: Patient tolerated treatment well Patient left: in chair;with call bell/phone within reach;with family/visitor present     Time: 1121-1139 PT Time Calculation (min) (ACUTE ONLY): 18 min  Charges:  $Gait Training: 8-22 mins                    G Codes:      Claretha Cooper 02/16/2015, 1:13 PM

## 2015-02-16 NOTE — Progress Notes (Signed)
CSW consulted for SNF placement. PN reviewed. PT recommends HHPT following hospital d/c. RNCM will assist with d/c planning.  Werner Lean LCSW [phone]

## 2015-02-16 NOTE — Care Management Note (Signed)
    Page 1 of 1   02/16/2015     12:53:41 PM CARE MANAGEMENT NOTE 02/16/2015  Patient:  Daniel Sherman, Daniel Sherman   Account Number:  [id number]  Date Initiated:  02/16/2015  Documentation initiated by:  [facility]  Subjective/Objective Assessment:   adm: LEFT TOTAL HIP ARTHROPLASTY ANTERIOR APPROACH (Left)     Action/Plan:   discharge planning   Anticipated DC Date:  02/16/2015   Anticipated DC Plan:  [place]  CM consult      [facility] Choice  HOME HEALTH   Choice offered to / List presented to:  C-1 Patient        [place] arranged  [place] PT      [place].   Status of service:  Completed, signed off Medicare Important Message given?   (If response is "NO", the following Medicare IM given date fields will be blank) Date Medicare IM given:   Medicare IM given by:   Date Additional Medicare IM given:   Additional Medicare IM given by:    Discharge Disposition:  [place]  Per UR Regulation:    If discussed at Long Length of Stay Meetings, dates discussed:    Comments:  02/16/15 11:00 Cm met with pt in room to offer choice of home health agency.  Pt chooses [facility] to render HHPT.  No DME is needed as pt has both a rolling walker and 3n1 at home. Referral called to [facility] rep, Stephanie.  No other CM needs were communicated.  Mariane Masters, BSN, CM [phone].

## 2015-02-16 NOTE — Progress Notes (Signed)
Physical Therapy Treatment Patient Details Name: Daniel Sherman MRN: [id number] DOB: [birth year] Today's Date: 02/16/2015    History of Present Illness s/p LTHA  , hx of HTN , GERD, DM 2.     PT Comments    Patient with improved LLe control, frequent cues for safety on Steps. Plans DC.  Follow Up Recommendations  Home health PT     Equipment Recommendations  None recommended by PT    Recommendations for Other Services       Precautions / Restrictions Precautions Precautions: Fall    Mobility  Bed Mobility   Bed Mobility: Supine to Sit;Sit to Supine     Supine to sit: Min guard Sit to supine: Min guard   General bed mobility comments: cues for R foot assisting L leg onto bed  Transfers   Equipment used: Rolling walker (2 wheeled) Transfers: Sit to/from Stand Sit to Stand: Min guard         General transfer comment: cues for RW safety and hand placement  Ambulation/Gait Ambulation/Gait assistance: Min assist Ambulation Distance (Feet): 100 Feet Assistive device: Rolling walker (2 wheeled) Gait Pattern/deviations: Step-through pattern     General Gait Details: improved control of the  L leg during swing, more steady   Stairs Stairs: Yes Stairs assistance: Min assist Stair Management: One rail Right;Step to pattern;Forwards;With cane Number of Stairs: 5 General stair comments: practiced multiple times as patient  required frequent cues for proper sequence  Wheelchair Mobility    Modified Rankin (Stroke Patients Only)       Balance Overall balance assessment: Needs assistance         Standing balance support: During functional activity;Bilateral upper extremity supported Standing balance-Leahy Scale: Fair                      Cognition Arousal/Alertness: Awake/alert                          Exercises Total Joint Exercises Ankle Circles/Pumps: AROM;Supine;Left;10 reps Quad Sets: AROM;Left;10 reps;Supine Short Arc  Quad: AROM;Left;10 reps;Supine Heel Slides: AAROM;Left;10 reps;Supine Hip ABduction/ADduction: Left;10 reps;Supine;AROM Straight Leg Raises: AAROM;Left;10 reps;Supine    General Comments        Pertinent Vitals/Pain Pain Score: 1  Pain Location: L thigh Pain Descriptors / Indicators: Sore Pain Intervention(s): Premedicated before session;Monitored during session    Home Living                      Prior Function            PT Goals (current goals can now be found in the care plan section) Progress towards PT goals: Progressing toward goals    Frequency  7X/week    PT Plan Current plan remains appropriate    Co-evaluation             End of Session   Activity Tolerance: Patient tolerated treatment well Patient left: in chair;with call bell/phone within reach;with family/visitor present     Time: 5498-2641 PT Time Calculation (min) (ACUTE ONLY): 11 min  Charges:  $Gait Training: 8-22 mins                    G Codes:      Claretha Cooper 02/16/2015, 3:23 PM

## 2015-02-16 NOTE — Evaluation (Signed)
Occupational Therapy Evaluation Patient Details Name: Daniel Sherman MRN: [id number] DOB: [birth year] Today's Date: 02/16/2015    History of Present Illness s/p LTHA  , hx of HTN , GERD, DM 2.    Clinical Impression   This 74 year old man was admitted for the above surgery, DA approach.  Will follow in acute to reinforce safety with bathroom transfers.  Pt's wife will assist with ADLs.  Goals are for supervision to min guard level:  He was mod I prior to admission and currently needs min A overall    Follow Up Recommendations  Supervision/Assistance - 24 hour    Equipment Recommendations  None recommended by OT (pt wants to try his higher commode at home without DME)    Recommendations for Other Services       Precautions / Restrictions Precautions Precautions: None Restrictions Weight Bearing Restrictions: No      Mobility Bed Mobility   Bed Mobility: Supine to Sit;Sit to Supine     Supine to sit: Min assist     General bed mobility comments: flat bed; assist for trunk  Transfers   Equipment used: Rolling walker (2 wheeled) Transfers: Sit to/from Stand Sit to Stand: Min assist         General transfer comment: cues for RW safety and hand placement    Balance Overall balance assessment:  (lost balance once during ADL)                                          ADL Overall ADL's : Needs assistance/impaired     Grooming: Oral care;Standing;Supervision/safety                   Toilet Transfer: Ambulation;Minimal assistance;Comfort height toilet   Toileting- Clothing Manipulation and Hygiene: Minimal assistance;Sit to/from stand   Tub/ Shower Transfer: Minimal assistance;Ambulation;Walk-in shower     General ADL Comments: Pt's wife will help with adls.  He can perform UB adls with set up and needs mod A for LB bathing and max for LB dressing.  He is familiar with reacher and uses one outside.  Pt stiff when walking and slightly  unsteady.  Lost balance when using urinal, both hands being used.  Educated wife to stand next to him to steady him during adls and ambulating     Vision     Perception     Praxis      Pertinent Vitals/Pain Pain Score: 1  Pain Location: L hip Pain Descriptors / Indicators: Sore Pain Intervention(s): Limited activity within patient's tolerance;Monitored during session;Premedicated before session;Repositioned;Ice applied     Hand Dominance     Extremity/Trunk Assessment Upper Extremity Assessment Upper Extremity Assessment: Overall WFL for tasks assessed           Communication Communication Communication: No difficulties   Cognition Arousal/Alertness: Awake/alert Behavior During Therapy: WFL for tasks assessed/performed Overall Cognitive Status: Within Functional Limits for tasks assessed                     General Comments       Exercises       Shoulder Instructions      Home Living Family/patient expects to be discharged to:: Private residence Living Arrangements: Spouse/significant other Available Help at Discharge: Family               Bathroom Shower/Tub: Walk-in shower  Bathroom Toilet: Handicapped height     Home Equipment: [place] - single point;Walker - 2 wheels   Additional Comments: pt has one high commode--nothing next to it to push up from      Prior Functioning/Environment Level of Independence: Independent with assistive device(s)             OT Diagnosis: Generalized weakness   OT Problem List: Decreased strength;Decreased activity tolerance;Impaired balance (sitting and/or standing);Decreased knowledge of use of DME or AE;Pain   OT Treatment/Interventions: Self-care/ADL training;DME and/or AE instruction;Balance training    OT Goals(Current goals can be found in the care plan section) Acute Rehab OT Goals Patient Stated Goal: wants to go to a special program Wed night:  accessible building OT Goal Formulation: With  patient Time For Goal Achievement: 02/23/15 Potential to Achieve Goals: Good ADL Goals Pt Will Transfer to Toilet: with supervision;ambulating (high commode) Pt Will Perform Tub/Shower Transfer: with min guard assist;Shower transfer;ambulating (built in seat)  OT Frequency: Min 2X/week   Barriers to D/C:            Co-evaluation              End of Session    Activity Tolerance: Patient tolerated treatment well Patient left: in chair;with call bell/phone within reach;with family/visitor present   Time: 0812-0853 OT Time Calculation (min): 41 min Charges:  OT General Charges $OT Visit: 1 Procedure OT Evaluation $Initial OT Evaluation Tier I: 1 Procedure OT Treatments $Self Care/Home Management : 8-22 mins G-Codes:    Bert Ptacek 03/10/15, 9:04 AM  Lesle Chris, OTR/L [phone] 2015/03/10

## 2015-02-16 NOTE — Progress Notes (Deleted)
   02/16/15 1308  PT Visit Information  Assistance Needed +1  History of Present Illness s/p LTHA  , hx of HTN , GERD, DM 2.   PT Time Calculation  PT Start Time (ACUTE ONLY) 1121  PT Stop Time (ACUTE ONLY) 1139  PT Time Calculation (min) (ACUTE ONLY) 18 min  Precautions  Precautions Fall  Cognition  Arousal/Alertness Awake/alert  Bed Mobility  Bed Mobility Supine to Sit;Sit to Supine  Supine to sit Min guard  Sit to supine Min guard  General bed mobility comments cues for R foot assisting L leg onto bed  Transfers  Equipment used Rolling walker (2 wheeled)  Sit to Stand Min assist  General transfer comment cues for RW safety and hand placement  Ambulation/Gait  Assistive device Rolling walker (2 wheeled)  General Gait Details unsteady gait for starting off and for controlling L leg during swing, multimodal cues for controling swing,  safe turning.  Balance  Standing balance-Leahy Scale Fair  Total Joint Exercises  Ankle Circles/Pumps AROM;Supine;Left;10 reps  Quad Sets AROM;Left;10 reps;Supine  Short Arc Quad AROM;Left;10 reps;Supine  Heel Slides AAROM;Left;10 reps;Supine  Hip ABduction/ADduction Left;10 reps;Supine;AROM  Straight Leg Raises AAROM;Left;10 reps;Supine  PT - End of Session  Activity Tolerance Patient tolerated treatment well  Patient left in chair;with call bell/phone within reach;with family/visitor present  Nurse Communication Mobility status  PT - Assessment/Plan  PT Plan Current plan remains appropriate  PT Frequency (ACUTE ONLY) 7X/week  Follow Up Recommendations Home health PT  PT equipment None recommended by PT  PT General Charges  $$ ACUTE PT VISIT 1 Procedure  PT Treatments  $Gait Training 8-22 mins  [place] PT [phone]

## 2015-02-21 NOTE — Discharge Summary (Signed)
Physician Discharge Summary  Patient ID: Daniel Sherman MRN: [id number] DOB/AGE: 74-Oct-1942 74 y.o.  Admit date: 02/15/2015 Discharge date: 02/16/2015   Procedures:  Procedure(s) (LRB): LEFT TOTAL HIP ARTHROPLASTY ANTERIOR APPROACH (Left)  Attending Physician:  Dr. [name]   Admission Diagnoses:   Left hip primary OA / pain  Discharge Diagnoses:  Principal Problem:   S/P left THA, AA Active Problems:   Obese  Past Medical History  Diagnosis Date  . PONV (postoperative nausea and vomiting)   . Myocardial infarction     mild Mi- 1984   . Hypertension   . Diabetes mellitus without complication   . GERD (gastroesophageal reflux disease)   . Arthritis     HPI:    Daniel Sherman, 74 y.o. male, has a history of pain and functional disability in the left hip(s) due to arthritis and patient has failed non-surgical conservative treatments for greater than 12 weeks to include NSAID's and/or analgesics, corticosteriod injections, use of assistive devices and activity modification. Onset of symptoms was gradual starting September 2015 with rapidlly worsening course since that time.The patient noted no past surgery on the left hip(s). Patient currently rates pain in the left hip at 8 out of 10 with activity. Patient has worsening of pain with activity and weight bearing, trendelenberg gait, pain that interfers with activities of daily living and pain with passive range of motion. Patient has evidence of periarticular osteophytes and joint space narrowing by imaging studies. This condition presents safety issues increasing the risk of falls. There is no current active infection. Risks, benefits and expectations were discussed with the patient. Risks including but not limited to the risk of anesthesia, blood clots, nerve damage, blood vessel damage, failure of the prosthesis, infection and up to and including death. Patient understand the risks, benefits and expectations and wishes to  proceed with surgery.   PCP: [name], MD   Discharged Condition: good  Hospital Course:  Patient underwent the above stated procedure on 02/15/2015. Patient tolerated the procedure well and brought to the recovery room in good condition and subsequently to the floor.  POD #1 BP: 119/56 ; Pulse: 96 ; Temp: 97.9 F (36.6 C) ; Resp: 16 Patient reports pain as mild, pain controlled. No events throughout the night. Feels like his hip is doing well. We discussed his low plts, but he is aware and states that this is a chronic issue, and that is a normal value.  Ready to be discharged home. Dorsiflexion/plantar flexion intact, incision: dressing C/D/I, no cellulitis present and compartment soft.   LABS  Basename    HGB  9.4  HCT  28.1    Discharge Exam: General appearance: alert, cooperative and no distress Extremities: Homans sign is negative, no sign of DVT, no edema, redness or tenderness in the calves or thighs and no ulcers, gangrene or trophic changes  Disposition: Home with follow up in 2 weeks   Follow-up Information    Follow up with [name], MD. Schedule an appointment as soon as possible for a visit in 2 weeks.   Specialty:  Orthopedic Surgery   Contact information:   [address] [place] [address] [phone]       Follow up with [facility].   Why:  home health physical therapy   Contact information:   [address] [place] [address] [phone]       Discharge Instructions    Call MD / Call 911  Complete by:  As directed   If you experience chest pain or shortness of breath, CALL 911 and be transported to the hospital emergency room.  If you develope a fever above 101 F, pus (white drainage) or increased drainage or redness at the wound, or calf pain, call your surgeon's office.     Change dressing    Complete by:  As directed   Maintain surgical dressing until follow up in the clinic. If the  edges start to pull up, may reinforce with tape. If the dressing is no longer working, may remove and cover with gauze and tape, but must keep the area dry and clean.  Call with any questions or concerns.     Constipation Prevention    Complete by:  As directed   Drink plenty of fluids.  Prune juice may be helpful.  You may use a stool softener, such as Colace (over the counter) 100 mg twice a day.  Use MiraLax (over the counter) for constipation as needed.     Diet - low sodium heart healthy    Complete by:  As directed      Discharge instructions    Complete by:  As directed   Maintain surgical dressing until follow up in the clinic. If the edges start to pull up, may reinforce with tape. If the dressing is no longer working, may remove and cover with gauze and tape, but must keep the area dry and clean.  Follow up in 2 weeks at [facility]. Call with any questions or concerns.     Increase activity slowly as tolerated    Complete by:  As directed      TED hose    Complete by:  As directed   Use stockings (TED hose) for 2 weeks on both leg(s).  You may remove them at night for sleeping.     Weight bearing as tolerated    Complete by:  As directed   Laterality:  left  Extremity:  Lower             Medication List    STOP taking these medications        acetaminophen 500 MG tablet  Commonly known as:  TYLENOL     HYDROcodone-acetaminophen 5-325 MG per tablet  Commonly known as:  NORCO/VICODIN  Replaced by:  HYDROcodone-acetaminophen 7.5-325 MG per tablet      TAKE these medications        amitriptyline 50 MG tablet  Commonly known as:  ELAVIL  Take 50 mg by mouth every evening.     aspirin 325 MG EC tablet  Take 1 tablet (325 mg total) by mouth 2 (two) times daily.     docusate sodium 100 MG capsule  Commonly known as:  COLACE  Take 1 capsule (100 mg total) by mouth 2 (two) times daily.     ferrous sulfate 325 (65 FE) MG tablet  Take 1 tablet (325 mg  total) by mouth 3 (three) times daily after meals.     FIBER LAXATIVE PO  Take 1 tablet by mouth every evening.     HUMULIN 70/30 [place]  Inject 10 units of lipase into the skin 2 (two) times daily.     hydrochlorothiazide 12.5 MG tablet  Commonly known as:  HYDRODIURIL  Take 12.5 mg by mouth every evening.     HYDROcodone-acetaminophen 7.5-325 MG per tablet  Commonly known as:  NORCO  Take 1-2 tablets by mouth every 4 (four) hours as  needed for moderate pain.     lisinopril 40 MG tablet  Commonly known as:  PRINIVIL,ZESTRIL  Take 40 mg by mouth every evening.     Magnesium 200 MG Tabs  Take 1 tablet by mouth every evening.     metFORMIN 1000 MG tablet  Commonly known as:  GLUCOPHAGE  Take 1,000 mg by mouth 2 (two) times daily.     OMEGA 3 PO  Take 1 capsule by mouth every evening.     omeprazole 20 MG capsule  Commonly known as:  PRILOSEC  Take 20 mg by mouth 2 (two) times daily.     polyethylene glycol packet  Commonly known as:  MIRALAX / GLYCOLAX  Take 17 g by mouth daily as needed for mild constipation.     simvastatin 40 MG tablet  Commonly known as:  ZOCOR  Take 40 mg by mouth every evening.     tiZANidine 4 MG tablet  Commonly known as:  ZANAFLEX  Take 1 tablet (4 mg total) by mouth every 6 (six) hours as needed for muscle spasms.         Signed: West Pugh. Camara Rosander   PA-C  02/21/2015, 2:44 PM

## 2015-12-02 DIAGNOSIS — E538 Deficiency of other specified B group vitamins: Secondary | ICD-10-CM | POA: Insufficient documentation

## 2016-02-03 DIAGNOSIS — M65312 Trigger thumb, left thumb: Secondary | ICD-10-CM | POA: Insufficient documentation

## 2016-02-08 ENCOUNTER — Encounter: Payer: Self-pay | Admitting: *Deleted

## 2016-02-15 ENCOUNTER — Ambulatory Visit: Payer: Medicare PPO | Admitting: Anesthesiology

## 2016-02-15 ENCOUNTER — Encounter: Payer: Self-pay | Admitting: *Deleted

## 2016-02-15 ENCOUNTER — Ambulatory Visit
Admission: RE | Admit: 2016-02-15 | Discharge: 2016-02-15 | Disposition: A | Discharge status: Home / Self Care | Payer: Medicare PPO | Source: Ambulatory Visit | Attending: Surgery | Admitting: Surgery

## 2016-02-15 ENCOUNTER — Encounter
Admission: RE | Disposition: A | Discharge status: Home / Self Care | Payer: Self-pay | Source: Ambulatory Visit | Attending: Surgery

## 2016-02-15 DIAGNOSIS — E109 Type 1 diabetes mellitus without complications: Secondary | ICD-10-CM | POA: Diagnosis not present

## 2016-02-15 DIAGNOSIS — M65312 Trigger thumb, left thumb: Secondary | ICD-10-CM | POA: Diagnosis not present

## 2016-02-15 DIAGNOSIS — E785 Hyperlipidemia, unspecified: Secondary | ICD-10-CM | POA: Insufficient documentation

## 2016-02-15 DIAGNOSIS — Z86010 Personal history of colonic polyps: Secondary | ICD-10-CM | POA: Diagnosis not present

## 2016-02-15 DIAGNOSIS — K219 Gastro-esophageal reflux disease without esophagitis: Secondary | ICD-10-CM | POA: Insufficient documentation

## 2016-02-15 DIAGNOSIS — G5602 Carpal tunnel syndrome, left upper limb: Secondary | ICD-10-CM | POA: Diagnosis not present

## 2016-02-15 DIAGNOSIS — I252 Old myocardial infarction: Secondary | ICD-10-CM | POA: Diagnosis not present

## 2016-02-15 DIAGNOSIS — I2510 Atherosclerotic heart disease of native coronary artery without angina pectoris: Secondary | ICD-10-CM | POA: Insufficient documentation

## 2016-02-15 DIAGNOSIS — G43909 Migraine, unspecified, not intractable, without status migrainosus: Secondary | ICD-10-CM | POA: Diagnosis not present

## 2016-02-15 DIAGNOSIS — Z794 Long term (current) use of insulin: Secondary | ICD-10-CM | POA: Diagnosis not present

## 2016-02-15 DIAGNOSIS — Z96642 Presence of left artificial hip joint: Secondary | ICD-10-CM | POA: Insufficient documentation

## 2016-02-15 DIAGNOSIS — Z8249 Family history of ischemic heart disease and other diseases of the circulatory system: Secondary | ICD-10-CM | POA: Insufficient documentation

## 2016-02-15 DIAGNOSIS — Z820 Family history of epilepsy and other diseases of the nervous system: Secondary | ICD-10-CM | POA: Insufficient documentation

## 2016-02-15 DIAGNOSIS — I10 Essential (primary) hypertension: Secondary | ICD-10-CM | POA: Insufficient documentation

## 2016-02-15 HISTORY — PX: CARPAL TUNNEL RELEASE: SHX101

## 2016-02-15 HISTORY — DX: Headache, unspecified: R51.9

## 2016-02-15 HISTORY — DX: Headache: R51

## 2016-02-15 HISTORY — PX: TRIGGER FINGER RELEASE: SHX641

## 2016-02-15 HISTORY — DX: Presence of dental prosthetic device (complete) (partial): Z97.2

## 2016-02-15 LAB — GLUCOSE, CAPILLARY
GLUCOSE-CAPILLARY: 116 mg/dL — AB (ref 65–99)
GLUCOSE-CAPILLARY: 75 mg/dL (ref 65–99)
Glucose-Capillary: 81 mg/dL (ref 65–99)

## 2016-02-15 SURGERY — RELEASE, CARPAL TUNNEL, ENDOSCOPIC
Anesthesia: Monitor Anesthesia Care | Site: Hand | Laterality: Left | Wound class: Clean

## 2016-02-15 MED ORDER — LIDOCAINE HCL (CARDIAC) 20 MG/ML IV SOLN
INTRAVENOUS | Status: DC | PRN
  Administered 2016-02-15: 20 mg via INTRAVENOUS

## 2016-02-15 MED ORDER — DEXTROSE 50 % IV SOLN
25.0000 mL | Freq: Once | INTRAVENOUS | Status: AC
  Administered 2016-02-15: 25 mL via INTRAVENOUS

## 2016-02-15 MED ORDER — BUPIVACAINE HCL (PF) 0.5 % IJ SOLN
INTRAMUSCULAR | Status: DC | PRN
  Administered 2016-02-15: 10 mL

## 2016-02-15 MED ORDER — CEFAZOLIN SODIUM-DEXTROSE 2-3 GM-% IV SOLR
2.0000 g | Freq: Once | INTRAVENOUS | Status: AC
  Administered 2016-02-15: 2 g via INTRAVENOUS

## 2016-02-15 MED ORDER — MIDAZOLAM HCL 2 MG/2ML IJ SOLN
INTRAMUSCULAR | Status: DC | PRN
  Administered 2016-02-15 (×2): 1 mg via INTRAVENOUS

## 2016-02-15 MED ORDER — PROPOFOL 500 MG/50ML IV EMUL
INTRAVENOUS | Status: DC | PRN
  Administered 2016-02-15: 50 ug/kg/min via INTRAVENOUS

## 2016-02-15 MED ORDER — LACTATED RINGERS IV SOLN
INTRAVENOUS | Status: DC
  Administered 2016-02-15: 12:00:00 via INTRAVENOUS

## 2016-02-15 MED ORDER — FENTANYL CITRATE (PF) 100 MCG/2ML IJ SOLN
INTRAMUSCULAR | Status: DC | PRN
  Administered 2016-02-15 (×2): 50 ug via INTRAVENOUS

## 2016-02-15 SURGICAL SUPPLY — 31 items
BANDAGE ELASTIC 2 VELCRO NS LF (GAUZE/BANDAGES/DRESSINGS) ×3 IMPLANT
BANDAGE ELASTIC 3 VELCRO NS (GAUZE/BANDAGES/DRESSINGS) IMPLANT
BNDG COHESIVE 4X5 TAN STRL (GAUZE/BANDAGES/DRESSINGS) ×2 IMPLANT
BNDG ESMARK 4X12 TAN STRL LF (GAUZE/BANDAGES/DRESSINGS) ×3 IMPLANT
CHLORAPREP W/TINT 26ML (MISCELLANEOUS) ×3 IMPLANT
CORD BIP STRL DISP 12FT (MISCELLANEOUS) ×3 IMPLANT
COVER LIGHT HANDLE UNIVERSAL (MISCELLANEOUS) ×6 IMPLANT
CUFF TOURNIQUET DUAL PORT 18X3 (MISCELLANEOUS) ×3 IMPLANT
DECANTER SPIKE VIAL GLASS SM (MISCELLANEOUS) ×1 IMPLANT
GAUZE PETRO XEROFOAM 1X8 (MISCELLANEOUS) ×3 IMPLANT
GAUZE SPONGE 4X4 12PLY STRL (GAUZE/BANDAGES/DRESSINGS) ×3 IMPLANT
GLOVE BIO SURGEON STRL SZ8 (GLOVE) ×9 IMPLANT
GLOVE BIOGEL PI IND STRL 8 (GLOVE) ×1 IMPLANT
GLOVE BIOGEL PI INDICATOR 8 (GLOVE) ×2
GLOVE INDICATOR 8.0 STRL GRN (GLOVE) ×3 IMPLANT
GOWN STRL REUS W/ TWL LRG LVL3 (GOWN DISPOSABLE) ×1 IMPLANT
GOWN STRL REUS W/ TWL XL LVL3 (GOWN DISPOSABLE) ×1 IMPLANT
GOWN STRL REUS W/TWL LRG LVL3 (GOWN DISPOSABLE) ×3
GOWN STRL REUS W/TWL XL LVL3 (GOWN DISPOSABLE) ×3
KIT CARPAL TUNNEL (MISCELLANEOUS) ×3
KIT ESCP INSRT D SLOT CANN KN (MISCELLANEOUS) ×1 IMPLANT
KIT ROOM TURNOVER OR (KITS) ×3 IMPLANT
NS IRRIG 500ML POUR BTL (IV SOLUTION) ×3 IMPLANT
PACK EXTREMITY ARMC (MISCELLANEOUS) ×3 IMPLANT
PAD GROUND ADULT SPLIT (MISCELLANEOUS) ×1 IMPLANT
SPLINT WRIST/FOREARM LG LT TX9 (SOFTGOODS) ×2 IMPLANT
STOCKINETTE STRL 6IN 960660 (GAUZE/BANDAGES/DRESSINGS) ×2 IMPLANT
STRAP BODY AND KNEE 60X3 (MISCELLANEOUS) ×3 IMPLANT
SUT PROLENE 4 0 PS 2 18 (SUTURE) ×3 IMPLANT
SUT VIC AB 3-0 SH 27 (SUTURE)
SUT VIC AB 3-0 SH 27X BRD (SUTURE) IMPLANT

## 2016-02-15 NOTE — Discharge Instructions (Signed)
General Anesthesia, Adult, Care After Refer to this sheet in the next few weeks. These instructions provide you with information on caring for yourself after your procedure. Your health care provider may also give you more specific instructions. Your treatment has been planned according to current medical practices, but problems sometimes occur. Call your health care provider if you have any problems or questions after your procedure. WHAT TO EXPECT AFTER THE PROCEDURE After the procedure, it is typical to experience:  Sleepiness.  Nausea and vomiting. HOME CARE INSTRUCTIONS  For the first 24 hours after general anesthesia:  Have a responsible person with you.  Do not drive a car. If you are alone, do not take public transportation.  Do not drink alcohol.  Do not take medicine that has not been prescribed by your health care provider.  Do not sign important papers or make important decisions.  You may resume a normal diet and activities as directed by your health care provider.  Change bandages (dressings) as directed.  If you have questions or problems that seem related to general anesthesia, call the hospital and ask for the anesthetist or anesthesiologist on call. SEEK MEDICAL CARE IF:  You have nausea and vomiting that continue the day after anesthesia.  You develop a rash. SEEK IMMEDIATE MEDICAL CARE IF:   You have difficulty breathing.  You have chest pain.  You have any allergic problems.   This information is not intended to replace advice given to you by your health care provider. Make sure you discuss any questions you have with your health care provider.   Document Released: 02/25/2001 Document Revised: 12/10/2014 Document Reviewed: 03/19/2012 Elsevier Interactive Patient Education 2016 [employer].  Keep dressing dry and intact. Keep hand elevated above heart level. May shower after dressing removed on postop day 4 (Sunday). Cover sutures with Band-Aids  after drying off and reapply wrist splint. Apply ice to affected area frequently. Return for follow-up in 10-14 days or as scheduled.

## 2016-02-15 NOTE — H&P (Signed)
Paper H&P to be scanned into permanent record. H&P reviewed. No changes. 

## 2016-02-15 NOTE — Transfer of Care (Signed)
Immediate Anesthesia Transfer of Care Note  Patient: Daniel Sherman  Procedure(s) Performed: Procedure(s) with comments: CARPAL TUNNEL RELEASE ENDOSCOPIC (Left) RELEASE OF LEFT TRIGGER THUMB (Left) - Diabetic - insulin  Patient Location: PACU  Anesthesia Type: MAC  Level of Consciousness: awake, alert  and patient cooperative  Airway and Oxygen Therapy: Patient Spontanous Breathing and Patient connected to supplemental oxygen  Post-op Assessment: Post-op Vital signs reviewed, Patient's Cardiovascular Status Stable, Respiratory Function Stable, Patent Airway and No signs of Nausea or vomiting  Post-op Vital Signs: Reviewed and stable  Complications: No apparent anesthesia complications

## 2016-02-15 NOTE — Anesthesia Procedure Notes (Addendum)
Anesthesia Regional Block:  Bier block (IV Regional)  Pre-Anesthetic Checklist: ,, timeout performed, Correct Patient, Correct Site, Correct Laterality, Correct Procedure, Correct Position, site marked, Risks and benefits discussed,  Surgical consent,  Pre-op evaluation,  At surgeon's request and post-op pain management  Laterality: Left  Prep: alcohol swabs       Needles:   Needle Type: Other      Needle Gauge: 25 and 25 G    Additional Needles: Bier block (IV Regional) Narrative:  Start time: 02/15/2016 12:42 PM End time: 02/15/2016 12:44 PM Injection made incrementally with aspirations every 5 mL.  Performed by: With CRNAs  Anesthesiologist: Ronelle Nigh  Additional Notes: 61ml 0.5% lidocaine injected by Dr Junious Dresser. Tolerated well by pt. No complications noted.   Procedure Name: MAC Performed by: Mayme Genta Pre-anesthesia Checklist: Patient identified, Emergency Drugs available, Suction available, Timeout performed and Patient being monitored Patient Re-evaluated:Patient Re-evaluated prior to inductionOxygen Delivery Method: Nasal cannula Placement Confirmation: positive ETCO2

## 2016-02-15 NOTE — Op Note (Signed)
02/15/2016  1:26 PM  Pre-Op Diagnosis:   1. Left carpal tunnel syndrome.  2. Left trigger thumb.  Post-Op Diagnosis:   Same.  Procedure:   1. Endoscopic left carpal tunnel release.  2. Left trigger thumb release.  Surgeon:   Pascal Lux, MD  Anesthesia:   Bier block  Findings:   As above.  Complications:   None  EBL:   2 cc  Fluids:   850 cc crystalloid  TT:   39 minutes at 250 mmHg  Drains:   None  Closure:   4-0 Prolene interrupted sutures  Brief Clinical Note:   The patient is a 75 year old male with a history of progressively worsening pain and paresthesias to his left hand. His symptoms have persisted despite medications, activity modification, etc. His history and examination are consistent with carpal tunnel syndrome. The patient presents at this time for an endoscopic left carpal tunnel release. The patient also complains of recurrent catching of his left thumb. His history and examination are consistent with a left trigger thumb. He also presents for release of this left trigger thumb.  Procedure:   The patient was brought into the operating room and lain in the supine position. After adequate IV sedation was achieved, a timeout was performed to verify the appropriate surgical site before a Bier block was placed by the anesthesiologist and the tourniquet inflated to 250 mmHg. The left hand and upper extremity were prepped with ChloraPrep solution before being draped sterilely. Preoperative antibiotics were administered. An approximately 1.5-2 cm incision was made over the volar wrist flexion crease, centered over the palmaris longus tendon. The incision was carried down through the subcutaneous tissues with care taken to identify and protect any neurovascular structures. The distal forearm fascia was penetrated just proximal to the transverse carpal ligament. The soft tissues were released off the superficial and deep surfaces of the distal forearm fascia and this was  released proximally for 3-4 cm under direct visualization.  Attention was directed distally. The Soil scientist was passed beneath the transverse carpal ligament along the ulnar aspect of the carpal tunnel and used to release any adhesions as well as to remove any adherent synovial tissue before first the smaller then the larger of the two dilators were passed beneath the transverse carpal ligament along the ulnar margin of the carpal tunnel. The slotted cannula was introduced and the endoscope was placed into the slotted cannula and the undersurface of the transverse carpal ligament visualized. The distal margin of the transverse carpal ligament was marked by placing a 25-gauge needle percutaneously at [place] cardinal point so that it entered the distal portion of the slotted cannula. Under endoscopic visualization, the transverse carpal ligament was released from proximal to distal using the end-cutting blade. A second pass was performed to ensure complete release of the ligament. The adequacy of release was verified both endoscopically and by palpation using the freer elevator.  Attention was then directed to the left thumb. An approximately 1.5-2.0 cm incision was made over the volar aspect of the left thumb at the level of the metacarpal head centered over the flexor sheath. The incision was carried down through the subcutaneous tissues with care taken to identify and protect the digital neurovascular structures. The flexor sheath was entered just proximal to the A1 pulley. The sheath was released proximally for several centimeters under direct visualization. Distally, a clamp was placed beneath the A1 pulley and used to release any adhesions before the A1 pulley was released using  a #15 blade. The underlying tendon was carefully inspected and found to be intact.   The wounds were irrigated thoroughly with sterile saline solution before being closed using 4-0 Prolene interrupted sutures. A total of 10  cc of 0.5% plain Sensorcaine was injected in and around both incisions before sterile bulky dressings were applied to the wounds. The patient was placed into a volar wrist splint before being awakened and returned to the recovery room in satisfactory condition after tolerating the procedure well.

## 2016-02-15 NOTE — Anesthesia Postprocedure Evaluation (Signed)
Anesthesia Post Note  Patient: NEZIAH MATHIEU  Procedure(s) Performed: Procedure(s) (LRB): CARPAL TUNNEL RELEASE ENDOSCOPIC (Left) RELEASE OF LEFT TRIGGER THUMB (Left)  Patient location during evaluation: PACU Anesthesia Type: Bier Block and MAC Level of consciousness: awake and alert and oriented Pain management: satisfactory to patient Vital Signs Assessment: post-procedure vital signs reviewed and stable Respiratory status: spontaneous breathing, nonlabored ventilation and respiratory function stable Cardiovascular status: blood pressure returned to baseline and stable Postop Assessment: Adequate PO intake and No signs of nausea or vomiting Anesthetic complications: no    Raliegh Ip

## 2016-02-15 NOTE — Anesthesia Preprocedure Evaluation (Signed)
Anesthesia Evaluation  Patient identified by MRN, date of birth, ID band  Reviewed: Allergy & Precautions, H&P , NPO status , Patient's Chart, lab work & pertinent test results  Airway Mallampati: II  TM Distance: >3 FB Neck ROM: full    Dental  (+) Upper Dentures, Lower Dentures   Pulmonary    Pulmonary exam normal        Cardiovascular hypertension, + CAD and + Past MI   Rhythm:regular Rate:Normal     Neuro/Psych    GI/Hepatic GERD  ,  Endo/Other  diabetes, Insulin Dependent  Renal/GU      Musculoskeletal   Abdominal   Peds  Hematology   Anesthesia Other Findings   Reproductive/Obstetrics                             Anesthesia Physical Anesthesia Plan  ASA: II  Anesthesia Plan: MAC   Post-op Pain Management:    Induction:   Airway Management Planned:   Additional Equipment:   Intra-op Plan:   Post-operative Plan:   Informed Consent: I have reviewed the patients History and Physical, chart, labs and discussed the procedure including the risks, benefits and alternatives for the proposed anesthesia with the patient or authorized representative who has indicated his/her understanding and acceptance.     Plan Discussed with: CRNA  Anesthesia Plan Comments:         Anesthesia Quick Evaluation

## 2016-02-16 ENCOUNTER — Encounter: Payer: Self-pay | Admitting: Surgery

## 2016-02-27 ENCOUNTER — Other Ambulatory Visit (HOSPITAL_COMMUNITY): Payer: Self-pay | Admitting: Orthopedic Surgery

## 2016-02-27 DIAGNOSIS — Z471 Aftercare following joint replacement surgery: Secondary | ICD-10-CM

## 2016-02-27 DIAGNOSIS — Z96642 Presence of left artificial hip joint: Principal | ICD-10-CM

## 2016-03-05 ENCOUNTER — Encounter (HOSPITAL_COMMUNITY)
Admission: RE | Admit: 2016-03-05 | Discharge: 2016-03-05 | Disposition: A | Discharge status: Home / Self Care | Payer: Medicare PPO | Source: Ambulatory Visit | Attending: Orthopedic Surgery | Admitting: Orthopedic Surgery

## 2016-03-05 DIAGNOSIS — Z471 Aftercare following joint replacement surgery: Secondary | ICD-10-CM | POA: Insufficient documentation

## 2016-03-05 DIAGNOSIS — Z96642 Presence of left artificial hip joint: Secondary | ICD-10-CM | POA: Diagnosis present

## 2016-03-05 MED ORDER — TECHNETIUM TC 99M MEDRONATE IV KIT
25.0000 | PACK | Freq: Once | INTRAVENOUS | Status: AC | PRN
  Administered 2016-03-05: 25.4 via INTRAVENOUS

## 2016-12-09 ENCOUNTER — Emergency Department: Payer: Medicare PPO

## 2016-12-09 ENCOUNTER — Inpatient Hospital Stay
Admission: EM | Admit: 2016-12-09 | Discharge: 2016-12-12 | DRG: 872 | Disposition: A | Discharge status: Home / Self Care | Payer: Medicare PPO | Attending: Internal Medicine | Admitting: Internal Medicine

## 2016-12-09 DIAGNOSIS — R7989 Other specified abnormal findings of blood chemistry: Secondary | ICD-10-CM

## 2016-12-09 DIAGNOSIS — Z6832 Body mass index (BMI) 32.0-32.9, adult: Secondary | ICD-10-CM | POA: Diagnosis not present

## 2016-12-09 DIAGNOSIS — Z7982 Long term (current) use of aspirin: Secondary | ICD-10-CM | POA: Diagnosis not present

## 2016-12-09 DIAGNOSIS — R748 Abnormal levels of other serum enzymes: Secondary | ICD-10-CM | POA: Diagnosis present

## 2016-12-09 DIAGNOSIS — K759 Inflammatory liver disease, unspecified: Secondary | ICD-10-CM

## 2016-12-09 DIAGNOSIS — E669 Obesity, unspecified: Secondary | ICD-10-CM | POA: Diagnosis present

## 2016-12-09 DIAGNOSIS — A4151 Sepsis due to Escherichia coli [E. coli]: Secondary | ICD-10-CM | POA: Diagnosis not present

## 2016-12-09 DIAGNOSIS — I252 Old myocardial infarction: Secondary | ICD-10-CM | POA: Diagnosis not present

## 2016-12-09 DIAGNOSIS — B349 Viral infection, unspecified: Secondary | ICD-10-CM

## 2016-12-09 DIAGNOSIS — E11319 Type 2 diabetes mellitus with unspecified diabetic retinopathy without macular edema: Secondary | ICD-10-CM | POA: Diagnosis present

## 2016-12-09 DIAGNOSIS — E119 Type 2 diabetes mellitus without complications: Secondary | ICD-10-CM

## 2016-12-09 DIAGNOSIS — Z96642 Presence of left artificial hip joint: Secondary | ICD-10-CM | POA: Diagnosis present

## 2016-12-09 DIAGNOSIS — R109 Unspecified abdominal pain: Secondary | ICD-10-CM

## 2016-12-09 DIAGNOSIS — A419 Sepsis, unspecified organism: Secondary | ICD-10-CM

## 2016-12-09 DIAGNOSIS — N179 Acute kidney failure, unspecified: Secondary | ICD-10-CM | POA: Diagnosis present

## 2016-12-09 DIAGNOSIS — Z87891 Personal history of nicotine dependence: Secondary | ICD-10-CM | POA: Diagnosis not present

## 2016-12-09 DIAGNOSIS — Z79899 Other long term (current) drug therapy: Secondary | ICD-10-CM

## 2016-12-09 DIAGNOSIS — R945 Abnormal results of liver function studies: Secondary | ICD-10-CM | POA: Diagnosis not present

## 2016-12-09 DIAGNOSIS — R509 Fever, unspecified: Secondary | ICD-10-CM | POA: Diagnosis not present

## 2016-12-09 DIAGNOSIS — I129 Hypertensive chronic kidney disease with stage 1 through stage 4 chronic kidney disease, or unspecified chronic kidney disease: Secondary | ICD-10-CM | POA: Diagnosis present

## 2016-12-09 DIAGNOSIS — R7401 Elevation of levels of liver transaminase levels: Secondary | ICD-10-CM

## 2016-12-09 DIAGNOSIS — E1122 Type 2 diabetes mellitus with diabetic chronic kidney disease: Secondary | ICD-10-CM | POA: Diagnosis present

## 2016-12-09 DIAGNOSIS — K760 Fatty (change of) liver, not elsewhere classified: Secondary | ICD-10-CM | POA: Diagnosis present

## 2016-12-09 DIAGNOSIS — N189 Chronic kidney disease, unspecified: Secondary | ICD-10-CM | POA: Diagnosis present

## 2016-12-09 DIAGNOSIS — K219 Gastro-esophageal reflux disease without esophagitis: Secondary | ICD-10-CM | POA: Diagnosis present

## 2016-12-09 DIAGNOSIS — R740 Nonspecific elevation of levels of transaminase and lactic acid dehydrogenase [LDH]: Secondary | ICD-10-CM | POA: Diagnosis present

## 2016-12-09 DIAGNOSIS — R338 Other retention of urine: Secondary | ICD-10-CM | POA: Diagnosis present

## 2016-12-09 DIAGNOSIS — N401 Enlarged prostate with lower urinary tract symptoms: Secondary | ICD-10-CM | POA: Diagnosis present

## 2016-12-09 DIAGNOSIS — R6510 Systemic inflammatory response syndrome (SIRS) of non-infectious origin without acute organ dysfunction: Secondary | ICD-10-CM

## 2016-12-09 DIAGNOSIS — Z794 Long term (current) use of insulin: Secondary | ICD-10-CM | POA: Diagnosis not present

## 2016-12-09 LAB — COMPREHENSIVE METABOLIC PANEL
ALK PHOS: 131 U/L — AB (ref 38–126)
ALT: 598 U/L — AB (ref 17–63)
ANION GAP: 8 (ref 5–15)
AST: 872 U/L — ABNORMAL HIGH (ref 15–41)
Albumin: 4.3 g/dL (ref 3.5–5.0)
BILIRUBIN TOTAL: 2 mg/dL — AB (ref 0.3–1.2)
BUN: 28 mg/dL — ABNORMAL HIGH (ref 6–20)
CALCIUM: 8.9 mg/dL (ref 8.9–10.3)
CO2: 27 mmol/L (ref 22–32)
CREATININE: 1.44 mg/dL — AB (ref 0.61–1.24)
Chloride: 101 mmol/L (ref 101–111)
GFR calc non Af Amer: 46 mL/min — ABNORMAL LOW (ref >60)
GFR, EST AFRICAN AMERICAN: 53 mL/min — AB (ref >60)
Glucose, Bld: 116 mg/dL — ABNORMAL HIGH (ref 65–99)
Potassium: 4.5 mmol/L (ref 3.5–5.1)
Sodium: 136 mmol/L (ref 135–145)
TOTAL PROTEIN: 7.6 g/dL (ref 6.5–8.1)

## 2016-12-09 LAB — URINALYSIS, COMPLETE (UACMP) WITH MICROSCOPIC
BILIRUBIN URINE: NEGATIVE
Bacteria, UA: NONE SEEN
GLUCOSE, UA: NEGATIVE mg/dL
HGB URINE DIPSTICK: NEGATIVE
KETONES UR: NEGATIVE mg/dL
Leukocytes, UA: NEGATIVE
Nitrite: NEGATIVE
PROTEIN: NEGATIVE mg/dL
Specific Gravity, Urine: 1.016 (ref 1.005–1.030)
pH: 5 (ref 5.0–8.0)

## 2016-12-09 LAB — CBC
HEMATOCRIT: 41.1 % (ref 40.0–52.0)
HEMOGLOBIN: 13.6 g/dL (ref 13.0–18.0)
MCH: 26.1 pg (ref 26.0–34.0)
MCHC: 33.1 g/dL (ref 32.0–36.0)
MCV: 79 fL — AB (ref 80.0–100.0)
Platelets: 77 10*3/uL — ABNORMAL LOW (ref 150–440)
RBC: 5.2 MIL/uL (ref 4.40–5.90)
RDW: 14.9 % — ABNORMAL HIGH (ref 11.5–14.5)
WBC: 5 10*3/uL (ref 3.8–10.6)

## 2016-12-09 LAB — PROTIME-INR
INR: 0.97
PROTHROMBIN TIME: 12.9 s (ref 11.4–15.2)

## 2016-12-09 LAB — DIFFERENTIAL
Basophils Absolute: 0 10*3/uL (ref 0–0.1)
Basophils Relative: 0 %
EOS PCT: 0 %
Eosinophils Absolute: 0 10*3/uL (ref 0–0.7)
LYMPHS ABS: 0.1 10*3/uL — AB (ref 1.0–3.6)
LYMPHS PCT: 2 %
MONO ABS: 0.2 10*3/uL (ref 0.2–1.0)
MONOS PCT: 3 %
NEUTROS ABS: 4.7 10*3/uL (ref 1.4–6.5)
Neutrophils Relative %: 95 %

## 2016-12-09 LAB — TROPONIN I: Troponin I: <0.03 ng/mL (ref <0.03)

## 2016-12-09 LAB — GLUCOSE, CAPILLARY: GLUCOSE-CAPILLARY: 108 mg/dL — AB (ref 65–99)

## 2016-12-09 LAB — MONONUCLEOSIS SCREEN: Mono Screen: NEGATIVE

## 2016-12-09 LAB — LACTIC ACID, PLASMA: LACTIC ACID, VENOUS: 2.1 mmol/L — AB (ref 0.5–1.9)

## 2016-12-09 LAB — RAPID INFLUENZA A&B ANTIGENS (ARMC ONLY)
INFLUENZA A (ARMC): NEGATIVE
INFLUENZA B (ARMC): NEGATIVE

## 2016-12-09 LAB — ACETAMINOPHEN LEVEL: Acetaminophen (Tylenol), Serum: <10 ug/mL — ABNORMAL LOW (ref 10–30)

## 2016-12-09 LAB — APTT: aPTT: 26 seconds (ref 24–36)

## 2016-12-09 MED ORDER — FAMOTIDINE IN NACL 20-0.9 MG/50ML-% IV SOLN
20.0000 mg | Freq: Two times a day (BID) | INTRAVENOUS | Status: DC
Start: 2016-12-09 — End: 2016-12-10
  Administered 2016-12-10: 20 mg via INTRAVENOUS
  Filled 2016-12-09: qty 50

## 2016-12-09 MED ORDER — PIPERACILLIN-TAZOBACTAM 3.375 G IVPB
3.3750 g | Freq: Three times a day (TID) | INTRAVENOUS | Status: DC
  Administered 2016-12-10: 3.375 g via INTRAVENOUS
  Filled 2016-12-09: qty 50

## 2016-12-09 MED ORDER — ONDANSETRON HCL 4 MG/2ML IJ SOLN
INTRAMUSCULAR | Status: AC
  Filled 2016-12-09: qty 2

## 2016-12-09 MED ORDER — ONDANSETRON HCL 4 MG/2ML IJ SOLN
4.0000 mg | Freq: Once | INTRAMUSCULAR | Status: AC
  Administered 2016-12-09: 4 mg via INTRAVENOUS

## 2016-12-09 MED ORDER — HEPARIN SODIUM (PORCINE) 5000 UNIT/ML IJ SOLN
5000.0000 [IU] | Freq: Three times a day (TID) | INTRAMUSCULAR | Status: DC
  Administered 2016-12-10 (×2): 5000 [IU] via SUBCUTANEOUS
  Filled 2016-12-09 (×2): qty 1

## 2016-12-09 MED ORDER — PIPERACILLIN-TAZOBACTAM 3.375 G IVPB
3.3750 g | Freq: Once | INTRAVENOUS | Status: AC
  Administered 2016-12-09: 3.375 g via INTRAVENOUS
  Filled 2016-12-09: qty 50

## 2016-12-09 MED ORDER — DONEPEZIL HCL 5 MG PO TABS
5.0000 mg | ORAL_TABLET | Freq: Every day | ORAL | Status: DC
  Administered 2016-12-10 – 2016-12-11 (×3): 5 mg via ORAL
  Filled 2016-12-09 (×3): qty 1

## 2016-12-09 MED ORDER — ONDANSETRON HCL 4 MG/2ML IJ SOLN: 4.0000 mg | Freq: Four times a day (QID) | INTRAMUSCULAR | Status: DC | PRN

## 2016-12-09 MED ORDER — ACETAMINOPHEN 500 MG PO TABS
1000.0000 mg | ORAL_TABLET | Freq: Once | ORAL | Status: AC
Start: 2016-12-09 — End: 2016-12-09
  Administered 2016-12-09: 1000 mg via ORAL
  Filled 2016-12-09: qty 2

## 2016-12-09 MED ORDER — HYDROCODONE-ACETAMINOPHEN 7.5-325 MG PO TABS: 1.0000 | ORAL_TABLET | ORAL | Status: DC | PRN

## 2016-12-09 MED ORDER — AMITRIPTYLINE HCL 25 MG PO TABS
25.0000 mg | ORAL_TABLET | Freq: Every evening | ORAL | Status: DC
  Administered 2016-12-10 – 2016-12-11 (×2): 25 mg via ORAL
  Filled 2016-12-09 (×2): qty 1

## 2016-12-09 MED ORDER — ONDANSETRON HCL 4 MG PO TABS: 4.0000 mg | ORAL_TABLET | Freq: Four times a day (QID) | ORAL | Status: DC | PRN

## 2016-12-09 MED ORDER — ACETAMINOPHEN 325 MG PO TABS
650.0000 mg | ORAL_TABLET | Freq: Four times a day (QID) | ORAL | Status: DC | PRN
  Administered 2016-12-10 – 2016-12-11 (×3): 650 mg via ORAL
  Filled 2016-12-09 (×3): qty 2

## 2016-12-09 MED ORDER — MORPHINE SULFATE (PF) 4 MG/ML IV SOLN: 2.0000 mg | INTRAVENOUS | Status: DC | PRN

## 2016-12-09 MED ORDER — INSULIN ASPART 100 UNIT/ML ~~LOC~~ SOLN
0.0000 [IU] | Freq: Three times a day (TID) | SUBCUTANEOUS | Status: DC
  Administered 2016-12-10: 13:00:00 2 [IU] via SUBCUTANEOUS
  Administered 2016-12-12: 08:00:00 1 [IU] via SUBCUTANEOUS
  Filled 2016-12-09: qty 1
  Filled 2016-12-09: qty 2

## 2016-12-09 MED ORDER — SODIUM CHLORIDE 0.9 % IV SOLN
INTRAVENOUS | Status: DC
  Administered 2016-12-09 – 2016-12-12 (×7): via INTRAVENOUS

## 2016-12-09 MED ORDER — SODIUM CHLORIDE 0.9% FLUSH
3.0000 mL | Freq: Two times a day (BID) | INTRAVENOUS | Status: DC
  Administered 2016-12-10: 20:00:00 3 mL via INTRAVENOUS
  Administered 2016-12-10: 10 mL via INTRAVENOUS
  Administered 2016-12-11 – 2016-12-12 (×3): 3 mL via INTRAVENOUS

## 2016-12-09 MED ORDER — ASPIRIN 81 MG PO CHEW
81.0000 mg | CHEWABLE_TABLET | Freq: Every day | ORAL | Status: DC
  Administered 2016-12-10 – 2016-12-12 (×3): 81 mg via ORAL
  Filled 2016-12-09 (×3): qty 1

## 2016-12-09 MED ORDER — PANTOPRAZOLE SODIUM 40 MG PO TBEC
40.0000 mg | DELAYED_RELEASE_TABLET | Freq: Every day | ORAL | Status: DC
  Administered 2016-12-10 – 2016-12-12 (×3): 40 mg via ORAL
  Filled 2016-12-09 (×3): qty 1

## 2016-12-09 MED ORDER — SODIUM CHLORIDE 0.9 % IV SOLN
Freq: Once | INTRAVENOUS | Status: AC
  Administered 2016-12-09: 16:00:00 via INTRAVENOUS

## 2016-12-09 MED ORDER — ACETAMINOPHEN 650 MG RE SUPP: 650.0000 mg | Freq: Four times a day (QID) | RECTAL | Status: DC | PRN

## 2016-12-09 MED ORDER — VANCOMYCIN HCL IN DEXTROSE 1-5 GM/200ML-% IV SOLN
1000.0000 mg | Freq: Once | INTRAVENOUS | Status: AC
  Administered 2016-12-09: 1000 mg via INTRAVENOUS
  Filled 2016-12-09: qty 200

## 2016-12-09 MED ORDER — LISINOPRIL 20 MG PO TABS: 20.0000 mg | ORAL_TABLET | Freq: Every evening | ORAL | Status: DC

## 2016-12-09 MED ORDER — ONDANSETRON HCL 4 MG/2ML IJ SOLN
INTRAMUSCULAR | Status: AC
  Administered 2016-12-09: 4 mg via INTRAVENOUS
  Filled 2016-12-09: qty 2

## 2016-12-09 MED ORDER — BISACODYL 10 MG RE SUPP: 10.0000 mg | Freq: Every day | RECTAL | Status: DC | PRN

## 2016-12-09 MED ORDER — DOCUSATE SODIUM 100 MG PO CAPS
100.0000 mg | ORAL_CAPSULE | Freq: Two times a day (BID) | ORAL | Status: DC
  Administered 2016-12-10 – 2016-12-12 (×6): 100 mg via ORAL
  Filled 2016-12-09 (×6): qty 1

## 2016-12-09 NOTE — ED Notes (Addendum)
FIRST NURSE NOTE: Sudden onset of shaking and weakness around 12 today, Pt able to ambulate normally this morning as was able to drive.  Family reports confusion as well when patient was searching for seatbelt under neath the seat. Family denies any slurred speech or changes in speech. Family notes some mixed answers when communicating with patient. Pt also c/o dizziness and headache that started about an hour ago. Pt woke up around 530 this morning. Sxs started around 12pm today. Pt reports bilateral numbness and tingling in upper extremities

## 2016-12-09 NOTE — ED Notes (Signed)
Pt vomiting, orders for Zofran obtained

## 2016-12-09 NOTE — ED Triage Notes (Signed)
Pt came to ED via pov c/o weakness and dizziness that started around 12pm today. Reports was at a church event and started feeling dizzy and shaky. Pt reports slurred speech that is now resolved.
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
## 2016-12-09 NOTE — Consult Note (Signed)
Pharmacy Antibiotic Note  Daniel Sherman is a 76 y.o. male admitted on 12/09/2016 with intra-abdominal infection.  Pharmacy has been consulted for zosyn dosing.  Plan: Zosyn 3.375g IV q8h (4 hour infusion).  Height: 5\' 6"  (167.6 cm) Weight: 198 lb (89.8 kg) IBW/kg (Calculated) : 63.8  Temp (24hrs), Avg:100.8 F (38.2 C), Min:99.8 F (37.7 C), Max:102.4 F (39.1 C)   Recent Labs Lab 12/09/16 1451 12/09/16 1516  WBC 5.0  --   CREATININE 1.44*  --   LATICACIDVEN  --  2.1*    Estimated Creatinine Clearance: 46.5 mL/min (by C-G formula based on SCr of 1.44 mg/dL (H)).    Allergies  Allergen Reactions  . Other     BLOOD PRODUCT REFUSAL    Antimicrobials this admission: zosyn 1/7 >>  vancomycin 1/7 >> one dose  Dose adjustments this admission:   Microbiology results: 1/7 BCx:  1/7 UCx:   Influenza neg  Thank you for allowing pharmacy to be a part of this patient's care.  Ramond Dial, Pharm.D, BCPS Clinical Pharmacist  12/09/2016 8:09 PM

## 2016-12-10 ENCOUNTER — Inpatient Hospital Stay: Payer: Medicare PPO

## 2016-12-10 ENCOUNTER — Other Ambulatory Visit: Payer: Medicare PPO

## 2016-12-10 DIAGNOSIS — R6510 Systemic inflammatory response syndrome (SIRS) of non-infectious origin without acute organ dysfunction: Secondary | ICD-10-CM

## 2016-12-10 DIAGNOSIS — R7989 Other specified abnormal findings of blood chemistry: Secondary | ICD-10-CM

## 2016-12-10 DIAGNOSIS — R945 Abnormal results of liver function studies: Secondary | ICD-10-CM

## 2016-12-10 DIAGNOSIS — K759 Inflammatory liver disease, unspecified: Secondary | ICD-10-CM

## 2016-12-10 DIAGNOSIS — A419 Sepsis, unspecified organism: Secondary | ICD-10-CM

## 2016-12-10 LAB — COMPREHENSIVE METABOLIC PANEL
ALT: 365 U/L — AB (ref 17–63)
AST: 276 U/L — ABNORMAL HIGH (ref 15–41)
Albumin: 3.1 g/dL — ABNORMAL LOW (ref 3.5–5.0)
Alkaline Phosphatase: 87 U/L (ref 38–126)
Anion gap: 6 (ref 5–15)
BILIRUBIN TOTAL: 2.5 mg/dL — AB (ref 0.3–1.2)
BUN: 23 mg/dL — ABNORMAL HIGH (ref 6–20)
CHLORIDE: 106 mmol/L (ref 101–111)
CO2: 25 mmol/L (ref 22–32)
CREATININE: 1.42 mg/dL — AB (ref 0.61–1.24)
Calcium: 7.6 mg/dL — ABNORMAL LOW (ref 8.9–10.3)
GFR, EST AFRICAN AMERICAN: 54 mL/min — AB (ref >60)
GFR, EST NON AFRICAN AMERICAN: 47 mL/min — AB (ref >60)
Glucose, Bld: 125 mg/dL — ABNORMAL HIGH (ref 65–99)
POTASSIUM: 4 mmol/L (ref 3.5–5.1)
Sodium: 137 mmol/L (ref 135–145)
TOTAL PROTEIN: 5.7 g/dL — AB (ref 6.5–8.1)

## 2016-12-10 LAB — BLOOD CULTURE ID PANEL (REFLEXED)
ACINETOBACTER BAUMANNII: NOT DETECTED
CANDIDA GLABRATA: NOT DETECTED
CANDIDA TROPICALIS: NOT DETECTED
Candida albicans: NOT DETECTED
Candida krusei: NOT DETECTED
Candida parapsilosis: NOT DETECTED
Carbapenem resistance: NOT DETECTED
ENTEROCOCCUS SPECIES: NOT DETECTED
ESCHERICHIA COLI: DETECTED — AB
Enterobacter cloacae complex: NOT DETECTED
Enterobacteriaceae species: DETECTED — AB
HAEMOPHILUS INFLUENZAE: NOT DETECTED
Klebsiella oxytoca: NOT DETECTED
Klebsiella pneumoniae: NOT DETECTED
LISTERIA MONOCYTOGENES: NOT DETECTED
NEISSERIA MENINGITIDIS: NOT DETECTED
Proteus species: NOT DETECTED
Pseudomonas aeruginosa: NOT DETECTED
SERRATIA MARCESCENS: NOT DETECTED
STAPHYLOCOCCUS AUREUS BCID: NOT DETECTED
STAPHYLOCOCCUS SPECIES: NOT DETECTED
STREPTOCOCCUS AGALACTIAE: NOT DETECTED
STREPTOCOCCUS PYOGENES: NOT DETECTED
STREPTOCOCCUS SPECIES: NOT DETECTED
Streptococcus pneumoniae: NOT DETECTED

## 2016-12-10 LAB — BILIRUBIN, DIRECT: BILIRUBIN DIRECT: 1.1 mg/dL — AB (ref 0.1–0.5)

## 2016-12-10 LAB — CBC
HCT: 33.2 % — ABNORMAL LOW (ref 40.0–52.0)
Hemoglobin: 11.1 g/dL — ABNORMAL LOW (ref 13.0–18.0)
MCH: 26.4 pg (ref 26.0–34.0)
MCHC: 33.5 g/dL (ref 32.0–36.0)
MCV: 78.6 fL — AB (ref 80.0–100.0)
PLATELETS: 59 10*3/uL — AB (ref 150–440)
RBC: 4.22 MIL/uL — ABNORMAL LOW (ref 4.40–5.90)
RDW: 14.7 % — AB (ref 11.5–14.5)
WBC: 4.4 10*3/uL (ref 3.8–10.6)

## 2016-12-10 LAB — GLUCOSE, CAPILLARY
GLUCOSE-CAPILLARY: 122 mg/dL — AB (ref 65–99)
GLUCOSE-CAPILLARY: 104 mg/dL — AB (ref 65–99)
GLUCOSE-CAPILLARY: 100 mg/dL — AB (ref 65–99)
Glucose-Capillary: 173 mg/dL — ABNORMAL HIGH (ref 65–99)
Glucose-Capillary: 82 mg/dL (ref 65–99)

## 2016-12-10 LAB — PROTIME-INR
INR: 1.33
Prothrombin Time: 16.6 seconds — ABNORMAL HIGH (ref 11.4–15.2)

## 2016-12-10 LAB — MRSA PCR SCREENING: MRSA BY PCR: NEGATIVE

## 2016-12-10 LAB — LACTIC ACID, PLASMA: Lactic Acid, Venous: 1.3 mmol/L (ref 0.5–1.9)

## 2016-12-10 MED ORDER — FAMOTIDINE 20 MG PO TABS: 20.0000 mg | ORAL_TABLET | Freq: Two times a day (BID) | ORAL | Status: DC

## 2016-12-10 MED ORDER — AMITRIPTYLINE HCL 25 MG PO TABS
25.0000 mg | ORAL_TABLET | Freq: Every day | ORAL | Status: DC
  Administered 2016-12-10: 20:00:00 25 mg via ORAL
  Filled 2016-12-10: qty 1

## 2016-12-10 MED ORDER — ORAL CARE MOUTH RINSE
15.0000 mL | Freq: Two times a day (BID) | OROMUCOSAL | Status: DC
  Administered 2016-12-10: 15 mL via OROMUCOSAL

## 2016-12-10 MED ORDER — TAMSULOSIN HCL 0.4 MG PO CAPS
0.4000 mg | ORAL_CAPSULE | Freq: Every day | ORAL | Status: DC
  Administered 2016-12-10 – 2016-12-12 (×3): 0.4 mg via ORAL
  Filled 2016-12-10 (×3): qty 1

## 2016-12-10 MED ORDER — IOPAMIDOL (ISOVUE-300) INJECTION 61%: 15.0000 mL | INTRAVENOUS | Status: AC

## 2016-12-10 MED ORDER — SODIUM CHLORIDE 0.9 % IV SOLN
2.0000 g | Freq: Two times a day (BID) | INTRAVENOUS | Status: DC
  Administered 2016-12-10 – 2016-12-11 (×2): 2 g via INTRAVENOUS
  Filled 2016-12-10 (×4): qty 2

## 2016-12-10 NOTE — Progress Notes (Signed)
Patient transferred to rm 111, report given to [facility], [place]. Patient removed off ICU telemetry and placed on floor telemetry. CCMD notified, Dedra, RN in room for second verification. Family at bedside with patient. Elink notified of transfer. Wilnette Kales

## 2016-12-10 NOTE — Progress Notes (Signed)
[facility] NAME: Daniel Sherman    MR#:  [id number]  DATE OF BIRTH:  1941/03/31  SUBJECTIVE:  CHIEF COMPLAINT:   Chief Complaint  Patient presents with  . Weakness  Patient is a 76 year old Caucasian male with past medical history significant for history of coronary artery disease, hypertension, diabetes mellitus, who presents to the hospital with fevers, chills, right wrist, nausea, vomiting, but not abdominal pain. On arrival to the hospital patient was febrile to 102, his lactic acid level as well as LFTs were elevated and he was admitted. His blood cultures grew Escherichia coli, now on meropenem. He feels good today, denies any abdominal pain, nausea, no on clear liquid diet.  Review of Systems  Constitutional: Negative for chills, fever and weight loss.  HENT: Negative for congestion.   Eyes: Negative for blurred vision and double vision.  Respiratory: Negative for cough, sputum production, shortness of breath and wheezing.   Cardiovascular: Negative for chest pain, palpitations, orthopnea, leg swelling and PND.  Gastrointestinal: Negative for abdominal pain, blood in stool, constipation, diarrhea, nausea and vomiting.  Genitourinary: Negative for dysuria, frequency, hematuria and urgency.  Musculoskeletal: Negative for falls.  Neurological: Negative for dizziness, tremors, focal weakness and headaches.  Endo/Heme/Allergies: Does not bruise/bleed easily.  Psychiatric/Behavioral: Negative for depression. The patient does not have insomnia.     VITAL SIGNS: Blood pressure 129/62, pulse (!) 103, temperature 98.9 F (37.2 C), temperature source Oral, resp. rate (!) 24, height 5\' 6"  (1.676 m), weight 91.2 kg (201 lb 1 oz), SpO2 100 %.  PHYSICAL EXAMINATION:   GENERAL:  76 y.o.-year-old patient lying in the bed with no acute distress.  EYES: Pupils equal, round, reactive to light and accommodation. No scleral icterus.  Extraocular muscles intact.  HEENT: Head atraumatic, normocephalic. Oropharynx and nasopharynx clear.  NECK:  Supple, no jugular venous distention. No thyroid enlargement, no tenderness.  LUNGS: Normal breath sounds bilaterally, no wheezing, rales,rhonchi or crepitation. No use of accessory muscles of respiration.  CARDIOVASCULAR: S1, S2 normal. No murmurs, rubs, or gallops.  ABDOMEN: Soft, minimal discomfort in right upper quadrant, no rebound or voluntary guarding, discomfort in left mid abdomen, some voluntary guarding, no rebound, nondistended. Bowel sounds present. No organomegaly or mass.  EXTREMITIES: No pedal edema, cyanosis, or clubbing.  NEUROLOGIC: Cranial nerves II through XII are intact. Muscle strength 5/5 in all extremities. Sensation intact. Gait not checked.  PSYCHIATRIC: The patient is alert and oriented x 3.  SKIN: No obvious rash, lesion, or ulcer.   ORDERS/RESULTS REVIEWED:   CBC  Recent Labs Lab 12/09/16 1451 12/10/16 0516  WBC 5.0 4.4  HGB 13.6 11.1*  HCT 41.1 33.2*  PLT 77* 59*  MCV 79.0* 78.6*  MCH 26.1 26.4  MCHC 33.1 33.5  RDW 14.9* 14.7*  LYMPHSABS 0.1*  --   MONOABS 0.2  --   EOSABS 0.0  --   BASOSABS 0.0  --    ------------------------------------------------------------------------------------------------------------------  Chemistries   Recent Labs Lab 12/09/16 1451 12/10/16 0516  NA 136 137  K 4.5 4.0  CL 101 106  CO2 27 25  GLUCOSE 116* 125*  BUN 28* 23*  CREATININE 1.44* 1.42*  CALCIUM 8.9 7.6*  AST 872* 276*  ALT 598* 365*  ALKPHOS 131* 87  BILITOT 2.0* 2.5*   ------------------------------------------------------------------------------------------------------------------ estimated creatinine clearance is 47.6 mL/min (by C-G formula based on SCr of 1.42 mg/dL (H)). ------------------------------------------------------------------------------------------------------------------ No results for input(s): TSH, T4TOTAL, T3FREE,  THYROIDAB in  the last 72 hours.  Invalid input(s): FREET3  Cardiac Enzymes  Recent Labs Lab 12/09/16 1451  TROPONINI <0.03   ------------------------------------------------------------------------------------------------------------------ Invalid input(s): POCBNP ---------------------------------------------------------------------------------------------------------------  RADIOLOGY: Dg Chest 1 View  Result Date: 12/09/2016 CLINICAL DATA:  76 year old acute onset of generalized weakness and dizziness that began approximately noon today. Episode of slurred speech which has resolved. Current history of diabetes, hypertension and coronary artery disease with prior MI. EXAM: Portable CHEST 1 VIEW COMPARISON:  None. FINDINGS: Cardiac silhouette normal in size for AP portable technique. Thoracic aorta mildly atherosclerotic. Hilar and mediastinal contours otherwise unremarkable. Lungs clear. Bronchovascular markings normal. Pulmonary vascularity normal. No visible pleural effusions. No pneumothorax. IMPRESSION: 1.  No acute cardiopulmonary disease. 2. Thoracic aortic atherosclerosis. Electronically Signed   By: Evangeline Dakin M.D.   On: 12/09/2016 15:32   Ct Head Code Stroke W/o Cm  Result Date: 12/09/2016 CLINICAL DATA:  Code stroke.  Acute weakness.  Confusion. EXAM: CT HEAD WITHOUT CONTRAST TECHNIQUE: Contiguous axial images were obtained from the base of the skull through the vertex without intravenous contrast. COMPARISON:  None. FINDINGS: Brain: No evidence of acute infarction, hemorrhage, hydrocephalus, extra-axial collection or mass lesion/mass effect. Mild chronic microvascular disease seen around the lateral ventricles. Generalized volume loss in keeping with patient age. Vascular: No hyperdense vessel.  Atherosclerotic calcification. Skull: No acute or aggressive finding. Sinuses/Orbits: Negative Other: Extensive thickening and calcification affecting the right auricle These results were  called by telephone at the time of interpretation on 12/09/2016 at 2:54 pm to Dr. Reita Cliche, who verbally acknowledged these results. ASPECTS [facility] Stroke Program Early CT Score) - Ganglionic level infarction (caudate, lentiform nuclei, internal capsule, insula, M1-M3 cortex): 7 - Supraganglionic infarction (M4-M6 cortex): 3 Total score (0-10 with 10 being normal): 10 IMPRESSION: 1. No acute finding. ASPECTS is 10. 2. Mild chronic microvascular disease. Electronically Signed   By: Monte Fantasia M.D.   On: 12/09/2016 14:55   US Abdomen Limited Ruq  Result Date: 12/09/2016 CLINICAL DATA:  Fever with vomiting EXAM: US ABDOMEN LIMITED - RIGHT UPPER QUADRANT COMPARISON:  None. FINDINGS: Gallbladder: Within the gallbladder, there are multiple small echogenic foci which move and shadow consistent with cholelithiasis. Largest individual calculus measures 5 mm. There is no appreciable gallbladder wall thickening or pericholecystic fluid. No sonographic Murphy sign noted by sonographer. Common bile duct: Diameter: 5 mm. There is no intrahepatic or extrahepatic biliary duct dilatation. Liver: No focal lesion identified. Liver echogenicity is increased diffusely. Incidental note is made of a cyst arising from the upper pole right kidney measuring 2.8 x 2.4 x 2.5 cm. IMPRESSION: Cholelithiasis. Increased liver echogenicity, a finding most likely due to hepatic steatosis. While no focal liver lesions are evident, it must be cautioned that the sensitivity of ultrasound for detection of focal liver lesions is diminished in this circumstance. Cyst arising from upper pole right kidney. Electronically Signed   By: Lowella Grip III M.D.   On: 12/09/2016 16:56    EKG:  Orders placed or performed during the hospital encounter of 12/09/16  . ED EKG  . ED EKG    ASSESSMENT AND PLAN:  Principal Problem:   SIRS (systemic inflammatory response syndrome) (HCC) Active Problems:   Diabetes mellitus without complication  (HCC)   Abnormal LFTs   Acute viral syndrome #1. Escherichia coli sepsis, continue meropenem, awaiting for sensitivities, changed to oral antibiotic when able #2. Elevated transaminase level, unclear etiology, concerning for cholecystitis, right upper quadrant ultrasound was unremarkable, but cholelithiasis and liver steatosis, get  a HIDA scan to rule out cholecystitis, get surgical consultation if needed #3 left mid abdominal pain, get CT scan of abdomen and pelvis to rule out intestinal pathology, continue meropenem #4. Thrombocytopenia, likely DIC, follow in the morning, no bleeding #5. Acute on chronic renal insufficiency, seems to be stable with therapy, continue IV fluids, reassess creatinine in the morning #6. Diabetes mellitus type 2, get hemoglobin, A1c, continue sliding scale insulin #7. Tachycardia, continue IV fluids, likely sepsis related, lactic acid level is normal #8 nausea, vomiting, resolved, initiate clear liquid diet, advance it as tolerated   Management plans discussed with the patient, family and they are in agreement.   DRUG ALLERGIES:  Allergies  Allergen Reactions  . Other     BLOOD PRODUCT REFUSAL    CODE STATUS:     Code Status Orders        Start     Ordered   12/09/16 2333  Full code  Continuous     12/09/16 2332    Code Status History    Date Active Date Inactive Code Status Order ID Comments User Context   02/15/2015 12:02 PM 02/16/2015  5:22 PM Full Code [id number]  Allen Norris, PA-C Inpatient    Advance Directive Documentation   Flowsheet Row Most Recent Value  Type of Advance Directive  Healthcare Power of Attorney  Pre-existing out of facility DNR order (yellow form or pink MOST form)  No data  "MOST" Form in Place?  No data      TOTAL TIME TAKING CARE OF THIS PATIENT: 40 minutes.  Discussed with family, all questions were answered, they voiced understanding  Valyn Latchford M.D on 12/10/2016 at 3:35 PM  Between 7am to 6pm - Pager -  [phone]  After 6pm go to [email or web address] - password [email or web address] [facility]  [facility] [facility]  Office  [phone]  CC: Primary care physician; [name], MD
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
## 2016-12-10 NOTE — Progress Notes (Signed)
CONCERNING: IV to Oral Route Change Policy  RECOMMENDATION: This patient is receiving famotidne by the intravenous route.  Based on criteria approved by the Pharmacy and Therapeutics Committee, the intravenous medication(s) is/are being converted to the equivalent oral dose form(s).   DESCRIPTION: These criteria include:  The patient is eating (either orally or via tube) and/or has been taking other orally administered medications for a least 24 hours  The patient has no evidence of active gastrointestinal bleeding or impaired GI absorption (gastrectomy, short bowel, patient on TNA or NPO).  If you have questions about this conversion, please contact the Pharmacy Department  []   [phone] )  [facility] [x]   [phone] )  [facility] []   [phone] )  [facility] []   [phone] )  [facility] []   [phone] )  [place], [facility] 12/10/2016 9:46 AM

## 2016-12-10 NOTE — Progress Notes (Signed)
PHARMACY - PHYSICIAN COMMUNICATION CRITICAL VALUE ALERT - BLOOD CULTURE IDENTIFICATION (BCID)  Results for orders placed or performed during the hospital encounter of 12/09/16  Blood Culture ID Panel (Reflexed) (Collected: 12/09/2016  3:07 PM)  Result Value Ref Range   Enterococcus species NOT DETECTED NOT DETECTED   Listeria monocytogenes NOT DETECTED NOT DETECTED   Staphylococcus species NOT DETECTED NOT DETECTED   Staphylococcus aureus NOT DETECTED NOT DETECTED   Streptococcus species NOT DETECTED NOT DETECTED   Streptococcus agalactiae NOT DETECTED NOT DETECTED   Streptococcus pneumoniae NOT DETECTED NOT DETECTED   Streptococcus pyogenes NOT DETECTED NOT DETECTED   Acinetobacter baumannii NOT DETECTED NOT DETECTED   Enterobacteriaceae species DETECTED (A) NOT DETECTED   Enterobacter cloacae complex NOT DETECTED NOT DETECTED   Escherichia coli DETECTED (A) NOT DETECTED   Klebsiella oxytoca NOT DETECTED NOT DETECTED   Klebsiella pneumoniae NOT DETECTED NOT DETECTED   Proteus species NOT DETECTED NOT DETECTED   Serratia marcescens NOT DETECTED NOT DETECTED   Carbapenem resistance NOT DETECTED NOT DETECTED   Haemophilus influenzae NOT DETECTED NOT DETECTED   Neisseria meningitidis NOT DETECTED NOT DETECTED   Pseudomonas aeruginosa NOT DETECTED NOT DETECTED   Candida albicans NOT DETECTED NOT DETECTED   Candida glabrata NOT DETECTED NOT DETECTED   Candida krusei NOT DETECTED NOT DETECTED   Candida parapsilosis NOT DETECTED NOT DETECTED   Candida tropicalis NOT DETECTED NOT DETECTED    Name of physician (or Provider) Contacted: Pyreddy  Changes to prescribed antibiotics required: Change to meropenem  Kimi Kroft S 12/10/2016  5:22 AM

## 2016-12-11 ENCOUNTER — Inpatient Hospital Stay: Payer: Medicare PPO

## 2016-12-11 LAB — COMPREHENSIVE METABOLIC PANEL
ALK PHOS: 70 U/L (ref 38–126)
ALT: 209 U/L — AB (ref 17–63)
AST: 96 U/L — AB (ref 15–41)
Albumin: 2.8 g/dL — ABNORMAL LOW (ref 3.5–5.0)
Anion gap: 8 (ref 5–15)
BILIRUBIN TOTAL: 1.5 mg/dL — AB (ref 0.3–1.2)
BUN: 21 mg/dL — AB (ref 6–20)
CALCIUM: 7.2 mg/dL — AB (ref 8.9–10.3)
CHLORIDE: 104 mmol/L (ref 101–111)
CO2: 23 mmol/L (ref 22–32)
CREATININE: 1.31 mg/dL — AB (ref 0.61–1.24)
GFR calc Af Amer: 60 mL/min — ABNORMAL LOW (ref >60)
GFR, EST NON AFRICAN AMERICAN: 52 mL/min — AB (ref >60)
Glucose, Bld: 107 mg/dL — ABNORMAL HIGH (ref 65–99)
Potassium: 3.4 mmol/L — ABNORMAL LOW (ref 3.5–5.1)
Sodium: 135 mmol/L (ref 135–145)
TOTAL PROTEIN: 5.5 g/dL — AB (ref 6.5–8.1)

## 2016-12-11 LAB — CBC
HCT: 29.9 % — ABNORMAL LOW (ref 40.0–52.0)
Hemoglobin: 10 g/dL — ABNORMAL LOW (ref 13.0–18.0)
MCH: 26.1 pg (ref 26.0–34.0)
MCHC: 33.5 g/dL (ref 32.0–36.0)
MCV: 77.9 fL — AB (ref 80.0–100.0)
PLATELETS: 50 10*3/uL — AB (ref 150–440)
RBC: 3.84 MIL/uL — ABNORMAL LOW (ref 4.40–5.90)
RDW: 14.7 % — AB (ref 11.5–14.5)
WBC: 2 10*3/uL — AB (ref 3.8–10.6)

## 2016-12-11 LAB — HEPATITIS PANEL, ACUTE
HEP A IGM: NEGATIVE
HEP B C IGM: NEGATIVE
HEP B S AG: NEGATIVE

## 2016-12-11 LAB — URINE CULTURE
Culture: NO GROWTH
Special Requests: NORMAL

## 2016-12-11 LAB — HEMOGLOBIN A1C
Hgb A1c MFr Bld: 5.8 % — ABNORMAL HIGH (ref 4.8–5.6)
Mean Plasma Glucose: 120 mg/dL

## 2016-12-11 LAB — PROTIME-INR
INR: 1.09
PROTHROMBIN TIME: 14.1 s (ref 11.4–15.2)

## 2016-12-11 LAB — GLUCOSE, CAPILLARY
GLUCOSE-CAPILLARY: 87 mg/dL (ref 65–99)
Glucose-Capillary: 92 mg/dL (ref 65–99)
Glucose-Capillary: 90 mg/dL (ref 65–99)
Glucose-Capillary: 105 mg/dL — ABNORMAL HIGH (ref 65–99)

## 2016-12-11 LAB — HSV 2 ANTIBODY, IGG: HSV 2 GLYCOPROTEIN G AB, IGG: 1.58 {index} — AB (ref 0.00–0.90)

## 2016-12-11 LAB — CMV IGM: CMV IgM: <30 AU/mL (ref 0.0–29.9)

## 2016-12-11 MED ORDER — MEROPENEM-SODIUM CHLORIDE 1 GM/50ML IV SOLR
1.0000 g | Freq: Three times a day (TID) | INTRAVENOUS | Status: DC
  Administered 2016-12-11 – 2016-12-12 (×2): 1 g via INTRAVENOUS
  Filled 2016-12-11 (×4): qty 50

## 2016-12-11 MED ORDER — TECHNETIUM TC 99M MEBROFENIN IV KIT
4.7000 | PACK | Freq: Once | INTRAVENOUS | Status: AC | PRN
  Administered 2016-12-11: 10:00:00 4.7 via INTRAVENOUS

## 2016-12-11 MED ORDER — SODIUM CHLORIDE 0.9 % IV SOLN
2.0000 g | Freq: Two times a day (BID) | INTRAVENOUS | Status: DC
  Administered 2016-12-11: 15:00:00 2 g via INTRAVENOUS
  Filled 2016-12-11 (×3): qty 2

## 2016-12-11 NOTE — Progress Notes (Signed)
[place] [facility] [place] NAME: Daniel Sherman    MR#:  [id number]  DATE OF BIRTH:  [birth year]  SUBJECTIVE:  CHIEF COMPLAINT:   Chief Complaint  Patient presents with  . Weakness  Patient is a 76 year old Caucasian male with past medical history significant for history of coronary artery disease, hypertension, diabetes mellitus, who presents to the hospital with fevers, chills, right wrist, nausea, vomiting, but not abdominal pain. On arrival to the hospital patient was febrile to 102, his lactic acid level as well as LFTs were elevated and he was admitted. His blood cultures grew Escherichia coli, now on meropenem. He feels good today, denies any abdominal pain, nausea, no on regular diet. CT of abdomen and pelvis as well as HIDA scan were unremarkable. Patient was seen by gastroenterologist and recommended no further workup, but antibiotic therapy. Escherichia coli. Sensitivities will be known tomorrow, discussed with microbiology lab today  Review of Systems  Constitutional: Negative for chills, fever and weight loss.  HENT: Negative for congestion.   Eyes: Negative for blurred vision and double vision.  Respiratory: Negative for cough, sputum production, shortness of breath and wheezing.   Cardiovascular: Negative for chest pain, palpitations, orthopnea, leg swelling and PND.  Gastrointestinal: Negative for abdominal pain, blood in stool, constipation, diarrhea, nausea and vomiting.  Genitourinary: Negative for dysuria, frequency, hematuria and urgency.  Musculoskeletal: Negative for falls.  Neurological: Negative for dizziness, tremors, focal weakness and headaches.  Endo/Heme/Allergies: Does not bruise/bleed easily.  Psychiatric/Behavioral: Negative for depression. The patient does not have insomnia.     VITAL SIGNS: Blood pressure 132/67, pulse 86, temperature 98 F (36.7 C), temperature source Oral, resp. rate 16, height 5\' 6"  (1.676 m),  weight 96.1 kg (211 lb 14.4 oz), SpO2 100 %.  PHYSICAL EXAMINATION:   GENERAL:  76 y.o.-year-old patient lying in the bed with no acute distress.  EYES: Pupils equal, round, reactive to light and accommodation. No scleral icterus. Extraocular muscles intact.  HEENT: Head atraumatic, normocephalic. Oropharynx and nasopharynx clear.  NECK:  Supple, no jugular venous distention. No thyroid enlargement, no tenderness.  LUNGS: Normal breath sounds bilaterally, no wheezing, rales,rhonchi or crepitation. No use of accessory muscles of respiration.  CARDIOVASCULAR: S1, S2 normal. No murmurs, rubs, or gallops.  ABDOMEN: Soft, minimal discomfort in right upper quadrant, no rebound or voluntary guarding, discomfort in left mid abdomen, some voluntary guarding, no rebound, nondistended. Bowel sounds present. No organomegaly or mass.  EXTREMITIES: No pedal edema, cyanosis, or clubbing.  NEUROLOGIC: Cranial nerves II through XII are intact. Muscle strength 5/5 in all extremities. Sensation intact. Gait not checked.  PSYCHIATRIC: The patient is alert and oriented x 3.  SKIN: No obvious rash, lesion, or ulcer.   ORDERS/RESULTS REVIEWED:   CBC  Recent Labs Lab 12/09/16 1451 12/10/16 0516 12/11/16 0406  WBC 5.0 4.4 2.0*  HGB 13.6 11.1* 10.0*  HCT 41.1 33.2* 29.9*  PLT 77* 59* 50*  MCV 79.0* 78.6* 77.9*  MCH 26.1 26.4 26.1  MCHC 33.1 33.5 33.5  RDW 14.9* 14.7* 14.7*  LYMPHSABS 0.1*  --   --   MONOABS 0.2  --   --   EOSABS 0.0  --   --   BASOSABS 0.0  --   --    ------------------------------------------------------------------------------------------------------------------  Chemistries   Recent Labs Lab 12/09/16 1451 12/10/16 0516 12/11/16 0406  NA 136 137 135  K 4.5 4.0 3.4*  CL 101 106 104  CO2 27 25 23  GLUCOSE 116* 125* 107*  BUN 28* 23* 21*  CREATININE 1.44* 1.42* 1.31*  CALCIUM 8.9 7.6* 7.2*  AST 872* 276* 96*  ALT 598* 365* 209*  ALKPHOS 131* 87 70  BILITOT 2.0* 2.5*  1.5*   ------------------------------------------------------------------------------------------------------------------ estimated creatinine clearance is 52.9 mL/min (by C-G formula based on SCr of 1.31 mg/dL (H)). ------------------------------------------------------------------------------------------------------------------ No results for input(s): TSH, T4TOTAL, T3FREE, THYROIDAB in the last 72 hours.  Invalid input(s): FREET3  Cardiac Enzymes  Recent Labs Lab 12/09/16 1451  TROPONINI <0.03   ------------------------------------------------------------------------------------------------------------------ Invalid input(s): POCBNP ---------------------------------------------------------------------------------------------------------------  RADIOLOGY: Ct Abdomen Pelvis Wo Contrast  Result Date: 12/10/2016 CLINICAL DATA:  Left lower quadrant pain.  Nausea. EXAM: CT ABDOMEN AND PELVIS WITHOUT CONTRAST TECHNIQUE: Multidetector CT imaging of the abdomen and pelvis was performed following the standard protocol without IV contrast. COMPARISON:  CT scan dated 06/09/2014 FINDINGS: Lower chest: Extensive coronary artery calcification. Aortic atherosclerosis. Overall heart size is normal. New bronchitic changes in both lower lobes. Tiny new bilateral pleural effusions. Hepatobiliary: Multiple small gallstones. No dilated bile ducts. Liver appears normal. Pancreas: Unremarkable. No pancreatic ductal dilatation or surrounding inflammatory changes. Spleen: Slight chronic splenomegaly, slightly increased. Calcified granulomas in the spleen, stable. Adrenals/Urinary Tract: Adrenal glands are normal. 3.2 cm cyst on the upper pole of the right kidney, unchanged. Slight increased nonspecific soft tissue stranding around the lower poles of both kidneys. No hydronephrosis. Bladder appears normal. Stomach/Bowel: Scattered diverticula in the distal colon. No diverticulitis. Terminal ileum and appendix and small  bowel appear normal. No hiatal hernia. Vascular/Lymphatic: Extensive aortic atherosclerosis. No adenopathy. Reproductive: Prostate is unremarkable. Other: No abdominal wall hernia or abnormality. No abdominopelvic ascites. Musculoskeletal: Left total hip prosthesis obscures some detail. However, there is what appears to be an oblique incomplete fracture through the posteromedial aspect of the left acetabulum. This may represent a remnant of an old fracture. Does the patient have pain at the left hip? IMPRESSION: 1. Cholelithiasis. 2. Chronic splenomegaly, slightly increased. 3. Nonspecific soft tissue stranding around the lower poles of both kidneys. 4. Fracture through the left acetabulum, possibly old. Electronically Signed   By: Lorriane Shire M.D.   On: 12/10/2016 15:43   Dg Chest 1 View  Result Date: 12/09/2016 CLINICAL DATA:  76 year old acute onset of generalized weakness and dizziness that began approximately noon today. Episode of slurred speech which has resolved. Current history of diabetes, hypertension and coronary artery disease with prior MI. EXAM: Portable CHEST 1 VIEW COMPARISON:  None. FINDINGS: Cardiac silhouette normal in size for AP portable technique. Thoracic aorta mildly atherosclerotic. Hilar and mediastinal contours otherwise unremarkable. Lungs clear. Bronchovascular markings normal. Pulmonary vascularity normal. No visible pleural effusions. No pneumothorax. IMPRESSION: 1.  No acute cardiopulmonary disease. 2. Thoracic aortic atherosclerosis. Electronically Signed   By: Evangeline Dakin M.D.   On: 12/09/2016 15:32   Nm Hepatobiliary Liver Func  Result Date: 12/11/2016 CLINICAL DATA:  Abdominal pain, nausea, vomiting. EXAM: NUCLEAR MEDICINE HEPATOBILIARY IMAGING TECHNIQUE: Sequential images of the abdomen were obtained out to 60 minutes following intravenous administration of radiopharmaceutical. RADIOPHARMACEUTICALS:  4.7 mCi Tc-75m  Choletec IV COMPARISON:  CT scan of December 10, 2016. FINDINGS: Prompt uptake and biliary excretion of activity by the liver is seen. Gallbladder activity is visualized, consistent with patency of cystic duct. Biliary activity passes into small bowel, consistent with patent common bile duct. IMPRESSION: Normal filling of gallbladder. No evidence of cystic duct obstruction. Electronically Signed   By: Marijo Conception, M.D.   On: 12/11/2016 11:39  US Abdomen Limited Ruq  Result Date: 12/09/2016 CLINICAL DATA:  Fever with vomiting EXAM: US ABDOMEN LIMITED - RIGHT UPPER QUADRANT COMPARISON:  None. FINDINGS: Gallbladder: Within the gallbladder, there are multiple small echogenic foci which move and shadow consistent with cholelithiasis. Largest individual calculus measures 5 mm. There is no appreciable gallbladder wall thickening or pericholecystic fluid. No sonographic Murphy sign noted by sonographer. Common bile duct: Diameter: 5 mm. There is no intrahepatic or extrahepatic biliary duct dilatation. Liver: No focal lesion identified. Liver echogenicity is increased diffusely. Incidental note is made of a cyst arising from the upper pole right kidney measuring 2.8 x 2.4 x 2.5 cm. IMPRESSION: Cholelithiasis. Increased liver echogenicity, a finding most likely due to hepatic steatosis. While no focal liver lesions are evident, it must be cautioned that the sensitivity of ultrasound for detection of focal liver lesions is diminished in this circumstance. Cyst arising from upper pole right kidney. Electronically Signed   By: Lowella Grip III M.D.   On: 12/09/2016 16:56    EKG:  Orders placed or performed during the hospital encounter of 12/09/16  . ED EKG  . ED EKG    ASSESSMENT AND PLAN:  Principal Problem:   SIRS (systemic inflammatory response syndrome) (HCC) Active Problems:   Diabetes mellitus without complication (HCC)   Abnormal LFTs   Acute viral syndrome   Hepatitis   Septicemia ([place]) #1. Escherichia coli sepsis, continue meropenem,  Sensitivities will be known tomorrow, per microbiology lab [facility] , continue meropenem intravenously  #2. Elevated transaminase level, right upper quadrant ultrasound was unremarkable, but cholelithiasis and liver steatosis,  HIDA scan is normal, transaminases level is improving, follow-up as outpatient  #3 left mid abdominal pain, CT scan of abdomen and pelvis was unremarkable, suspect musculoskeletal discomfort after Lovenox subcutaneous injection #4. Thrombocytopenia,, discontinue heparin, follow. [place] the morning, check pro time  #5. Acute on chronic renal insufficiency,  at baseline #6. Diabetes mellitus type 2, hemoglobin A1c was 5.8 . Discontinue Glucophage, patient may not need it at all,  continue sliding scale insulin while in the hospital  #7. Tachycardia,  resolved with IV fluids, likely sepsis related. #8 nausea, vomiting, resolved,  advance diet to regular  Management plans discussed with the patient, family and they are in agreement.   DRUG ALLERGIES:  Allergies  Allergen Reactions  . Other     BLOOD PRODUCT REFUSAL    CODE STATUS:     Code Status Orders        Start     Ordered   12/09/16 2333  Full code  Continuous     12/09/16 2332    Code Status History    Date Active Date Inactive Code Status Order ID Comments User Context   02/15/2015 12:02 PM 02/16/2015  5:22 PM Full Code [id number]  Allen Norris, PA-C Inpatient    Advance Directive Documentation   Flowsheet Row Most Recent Value  Type of Advance Directive  Healthcare Power of Attorney  Pre-existing out of facility DNR order (yellow form or pink MOST form)  No data  "MOST" Form in Place?  No data      TOTAL TIME TAKING CARE OF THIS PATIENT: 30 minutes.  Discussed with family, all questions were answered, they voiced understanding  Travaris Kosh M.D on 12/11/2016 at 2:57 PM  Between 7am to 6pm - Pager - [phone]  After 6pm go to [email or web address] - password [email or web address] [facility]  [place]  [facility]  Office  [phone]  CC: Primary  care physician; [name], MD

## 2016-12-11 NOTE — Progress Notes (Signed)
Daniel Lame, MD [facility]   [address] [place], [place] [address] Phone: [phone] Fax : [phone]   Subjective: The patient had a negative HIDA scan. The patient's liver enzymes are rapidly decreasing. He has no complaints today.   Objective: Vital signs in last 24 hours: Vitals:   12/10/16 2052 12/11/16 0500 12/11/16 0529 12/11/16 1216  BP: (!) 113/54  (!) 107/51 132/67  Pulse: 95  91 86  Resp: 20  17 16   Temp: 97.8 F (36.6 C)  98.8 F (37.1 C) 98 F (36.7 C)  TempSrc:   Oral Oral  SpO2: 90%  95% 100%  Weight:  211 lb 14.4 oz (96.1 kg)    Height:       Weight change: 13 lb 14.4 oz (6.305 kg) No intake or output data in the 24 hours ending 12/11/16 1251   Lab Results: @LABTEST2 @ Micro Results: Recent Results (from the past 240 hour(s))  Rapid Influenza A&B Antigens ([facility] only)     Status: None   Collection Time: 12/09/16  3:07 PM  Result Value Ref Range Status   Influenza A ([facility]) NEGATIVE NEGATIVE Final   Influenza B ([facility]) NEGATIVE NEGATIVE Final  Blood culture (routine x 2)     Status: Abnormal (Preliminary result)   Collection Time: 12/09/16  3:07 PM  Result Value Ref Range Status   Specimen Description BLOOD R FOREARM  Final   Special Requests BOTTLES DRAWN AEROBIC AND ANAEROBIC ANA 5 AER 9ML  Final   Culture  Setup Time   Final    GRAM NEGATIVE RODS IN BOTH AEROBIC AND ANAEROBIC BOTTLES CRITICAL RESULT CALLED TO, READ BACK BY AND VERIFIED WITH: MATT MCBANE AT [place] 12/10/16.PMH    Culture ESCHERICHIA COLI (A)  Final   Report Status PENDING  Incomplete  Blood culture (routine x 2)     Status: Abnormal (Preliminary result)   Collection Time: 12/09/16  3:07 PM  Result Value Ref Range Status   Specimen Description BLOOD LEFT ARM  Final   Special Requests BOTTLES DRAWN AEROBIC AND ANAEROBIC 12 MLS  Final   Culture  Setup Time   Final    GRAM NEGATIVE RODS IN BOTH AEROBIC AND ANAEROBIC BOTTLES CRITICAL RESULT CALLED TO, READ BACK BY AND  VERIFIED WITH: MATT MCBANE AT [place] 12/10/16.PMH CONFIRMED BY RWW Performed at [place] (A)  Final   Report Status PENDING  Incomplete  Blood Culture ID Panel (Reflexed)     Status: Abnormal   Collection Time: 12/09/16  3:07 PM  Result Value Ref Range Status   Enterococcus species NOT DETECTED NOT DETECTED Final   Listeria monocytogenes NOT DETECTED NOT DETECTED Final   Staphylococcus species NOT DETECTED NOT DETECTED Final   Staphylococcus aureus NOT DETECTED NOT DETECTED Final   Streptococcus species NOT DETECTED NOT DETECTED Final   Streptococcus agalactiae NOT DETECTED NOT DETECTED Final   Streptococcus pneumoniae NOT DETECTED NOT DETECTED Final   Streptococcus pyogenes NOT DETECTED NOT DETECTED Final   Acinetobacter baumannii NOT DETECTED NOT DETECTED Final   Enterobacteriaceae species DETECTED (A) NOT DETECTED Final    Comment: CRITICAL RESULT CALLED TO, READ BACK BY AND VERIFIED WITH: MATT MCBANE AT 0453 12/10/16.PMH    Enterobacter cloacae complex NOT DETECTED NOT DETECTED Final   Escherichia coli DETECTED (A) NOT DETECTED Final    Comment: CRITICAL RESULT CALLED TO, READ BACK BY AND VERIFIED WITH: MATT MCBANE AT 0453 12/10/16.PMH    Klebsiella oxytoca NOT DETECTED  NOT DETECTED Final   Klebsiella pneumoniae NOT DETECTED NOT DETECTED Final   Proteus species NOT DETECTED NOT DETECTED Final   Serratia marcescens NOT DETECTED NOT DETECTED Final   Carbapenem resistance NOT DETECTED NOT DETECTED Final   Haemophilus influenzae NOT DETECTED NOT DETECTED Final   Neisseria meningitidis NOT DETECTED NOT DETECTED Final   Pseudomonas aeruginosa NOT DETECTED NOT DETECTED Final   Candida albicans NOT DETECTED NOT DETECTED Final   Candida glabrata NOT DETECTED NOT DETECTED Final   Candida krusei NOT DETECTED NOT DETECTED Final   Candida parapsilosis NOT DETECTED NOT DETECTED Final   Candida tropicalis NOT DETECTED NOT DETECTED Final  Urine culture      Status: None   Collection Time: 12/09/16  5:47 PM  Result Value Ref Range Status   Specimen Description URINE, CATHETERIZED  Final   Special Requests Normal  Final   Culture NO GROWTH Performed at [facility]   Final   Report Status 12/11/2016 FINAL  Final  MRSA PCR Screening     Status: None   Collection Time: 12/09/16 11:45 PM  Result Value Ref Range Status   MRSA by PCR NEGATIVE NEGATIVE Final    Comment:        The GeneXpert MRSA Assay (FDA approved for NASAL specimens only), is one component of a comprehensive MRSA colonization surveillance program. It is not intended to diagnose MRSA infection nor to guide or monitor treatment for MRSA infections.    Studies/Results: Ct Abdomen Pelvis Wo Contrast  Result Date: 12/10/2016 CLINICAL DATA:  Left lower quadrant pain.  Nausea. EXAM: CT ABDOMEN AND PELVIS WITHOUT CONTRAST TECHNIQUE: Multidetector CT imaging of the abdomen and pelvis was performed following the standard protocol without IV contrast. COMPARISON:  CT scan dated 06/09/2014 FINDINGS: Lower chest: Extensive coronary artery calcification. Aortic atherosclerosis. Overall heart size is normal. New bronchitic changes in both lower lobes. Tiny new bilateral pleural effusions. Hepatobiliary: Multiple small gallstones. No dilated bile ducts. Liver appears normal. Pancreas: Unremarkable. No pancreatic ductal dilatation or surrounding inflammatory changes. Spleen: Slight chronic splenomegaly, slightly increased. Calcified granulomas in the spleen, stable. Adrenals/Urinary Tract: Adrenal glands are normal. 3.2 cm cyst on the upper pole of the right kidney, unchanged. Slight increased nonspecific soft tissue stranding around the lower poles of both kidneys. No hydronephrosis. Bladder appears normal. Stomach/Bowel: Scattered diverticula in the distal colon. No diverticulitis. Terminal ileum and appendix and small bowel appear normal. No hiatal hernia. Vascular/Lymphatic: Extensive  aortic atherosclerosis. No adenopathy. Reproductive: Prostate is unremarkable. Other: No abdominal wall hernia or abnormality. No abdominopelvic ascites. Musculoskeletal: Left total hip prosthesis obscures some detail. However, there is what appears to be an oblique incomplete fracture through the posteromedial aspect of the left acetabulum. This may represent a remnant of an old fracture. Does the patient have pain at the left hip? IMPRESSION: 1. Cholelithiasis. 2. Chronic splenomegaly, slightly increased. 3. Nonspecific soft tissue stranding around the lower poles of both kidneys. 4. Fracture through the left acetabulum, possibly old. Electronically Signed   By: Lorriane Shire M.D.   On: 12/10/2016 15:43   Dg Chest 1 View  Result Date: 12/09/2016 CLINICAL DATA:  76 year old acute onset of generalized weakness and dizziness that began approximately noon today. Episode of slurred speech which has resolved. Current history of diabetes, hypertension and coronary artery disease with prior MI. EXAM: Portable CHEST 1 VIEW COMPARISON:  None. FINDINGS: Cardiac silhouette normal in size for AP portable technique. Thoracic aorta mildly atherosclerotic. Hilar and mediastinal contours otherwise unremarkable.  Lungs clear. Bronchovascular markings normal. Pulmonary vascularity normal. No visible pleural effusions. No pneumothorax. IMPRESSION: 1.  No acute cardiopulmonary disease. 2. Thoracic aortic atherosclerosis. Electronically Signed   By: Evangeline Dakin M.D.   On: 12/09/2016 15:32   Nm Hepatobiliary Liver Func  Result Date: 12/11/2016 CLINICAL DATA:  Abdominal pain, nausea, vomiting. EXAM: NUCLEAR MEDICINE HEPATOBILIARY IMAGING TECHNIQUE: Sequential images of the abdomen were obtained out to 60 minutes following intravenous administration of radiopharmaceutical. RADIOPHARMACEUTICALS:  4.7 mCi Tc-59m  Choletec IV COMPARISON:  CT scan of December 10, 2016. FINDINGS: Prompt uptake and biliary excretion of activity by the  liver is seen. Gallbladder activity is visualized, consistent with patency of cystic duct. Biliary activity passes into small bowel, consistent with patent common bile duct. IMPRESSION: Normal filling of gallbladder. No evidence of cystic duct obstruction. Electronically Signed   By: Marijo Conception, M.D.   On: 12/11/2016 11:39   Ct Head Code Stroke W/o Cm  Result Date: 12/09/2016 CLINICAL DATA:  Code stroke.  Acute weakness.  Confusion. EXAM: CT HEAD WITHOUT CONTRAST TECHNIQUE: Contiguous axial images were obtained from the base of the skull through the vertex without intravenous contrast. COMPARISON:  None. FINDINGS: Brain: No evidence of acute infarction, hemorrhage, hydrocephalus, extra-axial collection or mass lesion/mass effect. Mild chronic microvascular disease seen around the lateral ventricles. Generalized volume loss in keeping with patient age. Vascular: No hyperdense vessel.  Atherosclerotic calcification. Skull: No acute or aggressive finding. Sinuses/Orbits: Negative Other: Extensive thickening and calcification affecting the right auricle These results were called by telephone at the time of interpretation on 12/09/2016 at 2:54 pm to Dr. Reita Cliche, who verbally acknowledged these results. ASPECTS [facility] Stroke Program Early CT Score) - Ganglionic level infarction (caudate, lentiform nuclei, internal capsule, insula, M1-M3 cortex): 7 - Supraganglionic infarction (M4-M6 cortex): 3 Total score (0-10 with 10 being normal): 10 IMPRESSION: 1. No acute finding. ASPECTS is 10. 2. Mild chronic microvascular disease. Electronically Signed   By: Monte Fantasia M.D.   On: 12/09/2016 14:55   US Abdomen Limited Ruq  Result Date: 12/09/2016 CLINICAL DATA:  Fever with vomiting EXAM: US ABDOMEN LIMITED - RIGHT UPPER QUADRANT COMPARISON:  None. FINDINGS: Gallbladder: Within the gallbladder, there are multiple small echogenic foci which move and shadow consistent with cholelithiasis. Largest individual calculus  measures 5 mm. There is no appreciable gallbladder wall thickening or pericholecystic fluid. No sonographic Murphy sign noted by sonographer. Common bile duct: Diameter: 5 mm. There is no intrahepatic or extrahepatic biliary duct dilatation. Liver: No focal lesion identified. Liver echogenicity is increased diffusely. Incidental note is made of a cyst arising from the upper pole right kidney measuring 2.8 x 2.4 x 2.5 cm. IMPRESSION: Cholelithiasis. Increased liver echogenicity, a finding most likely due to hepatic steatosis. While no focal liver lesions are evident, it must be cautioned that the sensitivity of ultrasound for detection of focal liver lesions is diminished in this circumstance. Cyst arising from upper pole right kidney. Electronically Signed   By: Lowella Grip III M.D.   On: 12/09/2016 16:56   Medications: I have reviewed the patient's current medications. Scheduled Meds: . amitriptyline  25 mg Oral QPM  . amitriptyline  25 mg Oral QHS  . aspirin  81 mg Oral Daily  . docusate sodium  100 mg Oral BID  . donepezil  5 mg Oral QHS  . heparin  5,000 Units Subcutaneous Q8H  . insulin aspart  0-9 Units Subcutaneous TID WC  . mouth rinse  15 mL Mouth  Rinse BID  . meropenem (MERREM) IV  2 g Intravenous Q12H  . pantoprazole  40 mg Oral Daily  . sodium chloride flush  3 mL Intravenous Q12H  . tamsulosin  0.4 mg Oral Daily   Continuous Infusions: . sodium chloride 125 mL/hr at 12/11/16 0104   PRN Meds:.acetaminophen **OR** acetaminophen, bisacodyl, HYDROcodone-acetaminophen, morphine injection, ondansetron **OR** ondansetron (ZOFRAN) IV   Assessment: Principal Problem:   SIRS (systemic inflammatory response syndrome) (HCC) Active Problems:   Diabetes mellitus without complication (HCC)   Abnormal LFTs   Acute viral syndrome   Hepatitis   Septicemia ([place])    Plan: This patient had abnormal liver enzymes likely related to his sepsis. The patient's liver enzymes were  characteristically out of proportion to his bilirubin and alkaline phosphatase if this was due to sludge or common bile duct stone. This in addition to no biliary dilator dictation and a negative HIDA scan and is unlikely that his gallbladder is the cause of his sepsis or abnormal liver enzymes. The patient may have had hypotension versus sepsis as the cause of his abnormal liver enzymes. Nothing further to do from a GI point of view and no further workup is needed.   LOS: 2 days   Daniel Sherman 12/11/2016, 12:51 PM

## 2016-12-11 NOTE — Care Management Important Message (Signed)
Important Message  Patient Details  Name: TRAYLON OTTENS MRN: [id number] Date of Birth: 1941-03-28   Medicare Important Message Given:  Yes    Shelbie Ammons, RN 12/11/2016, 8:28 AM

## 2016-12-12 LAB — CULTURE, BLOOD (ROUTINE X 2)

## 2016-12-12 LAB — GLUCOSE, CAPILLARY
Glucose-Capillary: 135 mg/dL — ABNORMAL HIGH (ref 65–99)
Glucose-Capillary: 86 mg/dL (ref 65–99)

## 2016-12-12 MED ORDER — CEFUROXIME AXETIL 500 MG PO TABS: 500.0000 mg | ORAL_TABLET | Freq: Two times a day (BID) | ORAL | 0 refills | Status: AC

## 2016-12-12 MED ORDER — TAMSULOSIN HCL 0.4 MG PO CAPS: 0.4000 mg | ORAL_CAPSULE | Freq: Every day | ORAL | 0 refills | Status: DC

## 2016-12-12 MED ORDER — DOCUSATE SODIUM 100 MG PO CAPS: 100.0000 mg | ORAL_CAPSULE | Freq: Two times a day (BID) | ORAL | Status: DC | PRN

## 2016-12-12 NOTE — Discharge Summary (Signed)
[facility] [place] NAME: Daniel Sherman    MR#:  [id number]  DATE OF BIRTH:  July 24, 1941  DATE OF ADMISSION:  12/09/2016 ADMITTING PHYSICIAN: Idelle Crouch, MD  DATE OF DISCHARGE: 12/12/2016  PRIMARY CARE PHYSICIAN: [name], MD    ADMISSION DIAGNOSIS:  Hepatitis [K75.9] SIRS (systemic inflammatory response syndrome) (HCC) [R65.10]  DISCHARGE DIAGNOSIS:  Principal Problem:   SIRS (systemic inflammatory response syndrome) (HCC) Active Problems:   Diabetes mellitus without complication (HCC)   Abnormal LFTs   Acute viral syndrome   Hepatitis   Septicemia ([place])   SECONDARY DIAGNOSIS:   Past Medical History:  Diagnosis Date  . Arthritis   . Diabetes mellitus without complication ([place])   . GERD (gastroesophageal reflux disease)   . Headache    migraines in past - none since started amitriptyline (20 yrs)  . Hypertension   . Myocardial infarction    mild Mi- 1984   . PONV (postoperative nausea and vomiting)   . Wears dentures    full upper and lower    HOSPITAL COURSE:  76 year old male with a history of CAD, hypertension and diabetes who presented with fever and chills and found have Escherichia coli sepsis.   1. Escherichia coli sepsis: Patient was initiated on broad-spectrum metabolic including meropenem. Escherichia coli is pansensitive. He will be discharged on oral Ceftin. There was no acute etiology of the Escherichia coli sepsis. His urine analysis on admission was negative. He underwent CT scan of the abdomen and pelvis which was unremarkable as well as a HIDA scan which was unremarkable. He has been afebrile and has much improved since admission.   2. Elevated transaminitis which has improved: Initially this was thought to be due to gallbladder issue however after HIDA scan this is been ruled out. I'm suspecting transaminitis was due to sepsis and hypoperfusion   3. 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 Diabetes with an A1c of 5.8: Patient may continue Glucophage for now and this may be discontinued by the discretion of PCP.  5. BPH: Patient had urinary retention and was started on tamsulosin.   6. Thrombocytopenia: Suspected to be due to sepsis or heparin. Patient will need outpatient CBC repeated  DISCHARGE CONDITIONS AND DIET:   Discharge home on diabetic diet Stable condition  CONSULTS OBTAINED:  Treatment Team:  [name], MD  DRUG ALLERGIES:   Allergies  Allergen Reactions  . Other     BLOOD PRODUCT REFUSAL    DISCHARGE MEDICATIONS:   Current Discharge Medication List    START taking these medications   Details  cefUROXime (CEFTIN) 500 MG tablet Take 1 tablet (500 mg total) by mouth 2 (two) times daily with a meal. Qty: 20 tablet, Refills: 0    tamsulosin (FLOMAX) 0.4 MG CAPS capsule Take 1 capsule (0.4 mg total) by mouth daily. Qty: 30 capsule, Refills: 0      CONTINUE these medications which have CHANGED   Details  docusate sodium (COLACE) 100 MG capsule Take 1 capsule (100 mg total) by mouth 2 (two) times daily as needed.      CONTINUE these medications which have NOT CHANGED   Details  amitriptyline (ELAVIL) 25 MG tablet Take 25 mg by mouth every evening.     aspirin 81 MG tablet Take 81 mg by mouth daily.    Cyanocobalamin (VITAMIN B-12 IJ) Inject as directed every 30 (thirty) days.  donepezil (ARICEPT) 5 MG tablet Take 5 mg by mouth at bedtime.    hydrochlorothiazide (HYDRODIURIL) 12.5 MG tablet Take 12.5 mg by mouth every evening.    Insulin NPH Isophane & Regular (HUMULIN 70/30 [place]) Inject 15 Units into the skin 2 (two) times daily.     lisinopril (PRINIVIL,ZESTRIL) 40 MG tablet Take 40 mg by mouth every evening.    Magnesium 500 MG TABS Take 1 tablet by mouth every evening.    metFORMIN (GLUCOPHAGE) 1000 MG tablet Take 1,000 mg by mouth 2 (two) times daily.    omeprazole (PRILOSEC) 40 MG capsule Take 40 mg by mouth  daily.     simvastatin (ZOCOR) 40 MG tablet Take 40 mg by mouth every evening.    HYDROcodone-acetaminophen (NORCO) 7.5-325 MG per tablet Take 1-2 tablets by mouth every 4 (four) hours as needed for moderate pain. Qty: 100 tablet, Refills: 0    tiZANidine (ZANAFLEX) 4 MG tablet Take 1 tablet (4 mg total) by mouth every 6 (six) hours as needed for muscle spasms. Qty: 30 tablet, Refills: 0              Today   CHIEF COMPLAINT:  Patient is doing well this point. Patient wants to go home. Patient has not had fevers.   VITAL SIGNS:  Blood pressure 132/60, pulse 82, temperature 98.5 F (36.9 C), temperature source Oral, resp. rate 20, height 5\' 6"  (1.676 m), weight 95.8 kg (211 lb 3.2 oz), SpO2 96 %.   REVIEW OF SYSTEMS:  Review of Systems  Constitutional: Negative.  Negative for chills, fever and malaise/fatigue.  HENT: Negative.  Negative for ear discharge, ear pain, hearing loss, nosebleeds and sore throat.   Eyes: Negative.  Negative for blurred vision and pain.  Respiratory: Negative.  Negative for cough, hemoptysis, shortness of breath and wheezing.   Cardiovascular: Negative.  Negative for chest pain, palpitations and leg swelling.  Gastrointestinal: Negative.  Negative for abdominal pain, blood in stool, diarrhea, nausea and vomiting.  Genitourinary: Negative.  Negative for dysuria.  Musculoskeletal: Negative.  Negative for back pain.  Skin: Negative.   Neurological: Negative for dizziness, tremors, speech change, focal weakness, seizures and headaches.  Endo/Heme/Allergies: Negative.  Does not bruise/bleed easily.  Psychiatric/Behavioral: Negative.  Negative for depression, hallucinations and suicidal ideas.     PHYSICAL EXAMINATION:  GENERAL:  76 y.o.-year-old patient lying in the bed with no acute distress.  NECK:  Supple, no jugular venous distention. No thyroid enlargement, no tenderness.  LUNGS: Normal breath sounds bilaterally, no wheezing, rales,rhonchi   No use of accessory muscles of respiration.  CARDIOVASCULAR: S1, S2 normal. No murmurs, rubs, or gallops.  ABDOMEN: Soft, non-tender, non-distended. Bowel sounds present. No organomegaly or mass.  EXTREMITIES: Minimal pedal edema, no cyanosis, or clubbing.  PSYCHIATRIC: The patient is alert and oriented x 3.  SKIN: No obvious rash, lesion, or ulcer.   DATA REVIEW:   CBC  Recent Labs Lab 12/11/16 0406  WBC 2.0*  HGB 10.0*  HCT 29.9*  PLT 50*    Chemistries   Recent Labs Lab 12/11/16 0406  NA 135  K 3.4*  CL 104  CO2 23  GLUCOSE 107*  BUN 21*  CREATININE 1.31*  CALCIUM 7.2*  AST 96*  ALT 209*  ALKPHOS 70  BILITOT 1.5*    Cardiac Enzymes  Recent Labs Lab 12/09/16 1451  TROPONINI <0.03    Microbiology Results  @[email or web address] @  RADIOLOGY:  Ct Abdomen Pelvis Wo Contrast  Result Date: 12/10/2016 CLINICAL DATA:  Left lower quadrant pain.  Nausea. EXAM: CT ABDOMEN AND PELVIS WITHOUT CONTRAST TECHNIQUE: Multidetector CT imaging of the abdomen and pelvis was performed following the standard protocol without IV contrast. COMPARISON:  CT scan dated 06/09/2014 FINDINGS: Lower chest: Extensive coronary artery calcification. Aortic atherosclerosis. Overall heart size is normal. New bronchitic changes in both lower lobes. Tiny new bilateral pleural effusions. Hepatobiliary: Multiple small gallstones. No dilated bile ducts. Liver appears normal. Pancreas: Unremarkable. No pancreatic ductal dilatation or surrounding inflammatory changes. Spleen: Slight chronic splenomegaly, slightly increased. Calcified granulomas in the spleen, stable. Adrenals/Urinary Tract: Adrenal glands are normal. 3.2 cm cyst on the upper pole of the right kidney, unchanged. Slight increased nonspecific soft tissue stranding around the lower poles of both kidneys. No hydronephrosis. Bladder appears normal. Stomach/Bowel: Scattered diverticula in the distal colon. No diverticulitis. Terminal ileum and appendix and  small bowel appear normal. No hiatal hernia. Vascular/Lymphatic: Extensive aortic atherosclerosis. No adenopathy. Reproductive: Prostate is unremarkable. Other: No abdominal wall hernia or abnormality. No abdominopelvic ascites. Musculoskeletal: Left total hip prosthesis obscures some detail. However, there is what appears to be an oblique incomplete fracture through the posteromedial aspect of the left acetabulum. This may represent a remnant of an old fracture. Does the patient have pain at the left hip? IMPRESSION: 1. Cholelithiasis. 2. Chronic splenomegaly, slightly increased. 3. Nonspecific soft tissue stranding around the lower poles of both kidneys. 4. Fracture through the left acetabulum, possibly old. Electronically Signed   By: Lorriane Shire M.D.   On: 12/10/2016 15:43   Nm Hepatobiliary Liver Func  Result Date: 12/11/2016 CLINICAL DATA:  Abdominal pain, nausea, vomiting. EXAM: NUCLEAR MEDICINE HEPATOBILIARY IMAGING TECHNIQUE: Sequential images of the abdomen were obtained out to 60 minutes following intravenous administration of radiopharmaceutical. RADIOPHARMACEUTICALS:  4.7 mCi Tc-83m  Choletec IV COMPARISON:  CT scan of December 10, 2016. FINDINGS: Prompt uptake and biliary excretion of activity by the liver is seen. Gallbladder activity is visualized, consistent with patency of cystic duct. Biliary activity passes into small bowel, consistent with patent common bile duct. IMPRESSION: Normal filling of gallbladder. No evidence of cystic duct obstruction. Electronically Signed   By: Marijo Conception, M.D.   On: 12/11/2016 11:39      Management plans discussed with the patient and he is is in agreement. Stable for discharge home  Patient should follow up with pcp for CBC  CODE STATUS:     Code Status Orders        Start     Ordered   12/09/16 2333  Full code  Continuous     12/09/16 2332    Code Status History    Date Active Date Inactive Code Status Order ID Comments User Context    02/15/2015 12:02 PM 02/16/2015  5:22 PM Full Code [id number]  Allen Norris, PA-C Inpatient    Advance Directive Documentation   Flowsheet Row Most Recent Value  Type of Advance Directive  Healthcare Power of Attorney  Pre-existing out of facility DNR order (yellow form or pink MOST form)  No data  "MOST" Form in Place?  No data      TOTAL TIME TAKING CARE OF THIS PATIENT: 33 minutes.    Note: This dictation was prepared with Dragon dictation along with smaller phrase technology. Any transcriptional errors that result from this process are unintentional.  Daquawn Seelman M.D on 12/12/2016 at 8:40 AM  Between 7am to 6pm - Pager - [phone] After 6pm go to [email or web address] - [email or web address]  [facility]  Office  [phone]  CC: Primary care physician; [name], MD

## 2017-11-05 ENCOUNTER — Encounter: Payer: Self-pay | Admitting: *Deleted

## 2017-11-05 ENCOUNTER — Other Ambulatory Visit: Payer: Self-pay

## 2017-11-12 NOTE — Discharge Instructions (Signed)

## 2017-11-13 ENCOUNTER — Ambulatory Visit: Payer: Medicare PPO | Admitting: Anesthesiology

## 2017-11-13 ENCOUNTER — Encounter
Admission: RE | Disposition: A | Discharge status: Home / Self Care | Payer: Self-pay | Source: Ambulatory Visit | Attending: Ophthalmology

## 2017-11-13 ENCOUNTER — Ambulatory Visit
Admission: RE | Admit: 2017-11-13 | Discharge: 2017-11-13 | Disposition: A | Discharge status: Home / Self Care | Payer: Medicare PPO | Source: Ambulatory Visit | Attending: Ophthalmology | Admitting: Ophthalmology

## 2017-11-13 DIAGNOSIS — I10 Essential (primary) hypertension: Secondary | ICD-10-CM | POA: Insufficient documentation

## 2017-11-13 DIAGNOSIS — I252 Old myocardial infarction: Secondary | ICD-10-CM | POA: Insufficient documentation

## 2017-11-13 DIAGNOSIS — Z794 Long term (current) use of insulin: Secondary | ICD-10-CM | POA: Insufficient documentation

## 2017-11-13 DIAGNOSIS — E119 Type 2 diabetes mellitus without complications: Secondary | ICD-10-CM | POA: Insufficient documentation

## 2017-11-13 DIAGNOSIS — Z79899 Other long term (current) drug therapy: Secondary | ICD-10-CM | POA: Insufficient documentation

## 2017-11-13 DIAGNOSIS — K219 Gastro-esophageal reflux disease without esophagitis: Secondary | ICD-10-CM | POA: Insufficient documentation

## 2017-11-13 DIAGNOSIS — H2512 Age-related nuclear cataract, left eye: Secondary | ICD-10-CM | POA: Insufficient documentation

## 2017-11-13 HISTORY — PX: CATARACT EXTRACTION W/PHACO: SHX586

## 2017-11-13 LAB — GLUCOSE, CAPILLARY
GLUCOSE-CAPILLARY: 107 mg/dL — AB (ref 65–99)
Glucose-Capillary: 99 mg/dL (ref 65–99)

## 2017-11-13 SURGERY — PHACOEMULSIFICATION, CATARACT, WITH IOL INSERTION
Anesthesia: General | Laterality: Left | Wound class: Clean

## 2017-11-13 MED ORDER — BRIMONIDINE TARTRATE-TIMOLOL 0.2-0.5 % OP SOLN
OPHTHALMIC | Status: DC | PRN
  Administered 2017-11-13: 1 [drp] via OPHTHALMIC

## 2017-11-13 MED ORDER — MOXIFLOXACIN HCL 0.5 % OP SOLN
1.0000 [drp] | OPHTHALMIC | Status: DC | PRN
Start: 2017-11-13 — End: 2017-11-13
  Administered 2017-11-13 (×3): 1 [drp] via OPHTHALMIC

## 2017-11-13 MED ORDER — CEFUROXIME OPHTHALMIC INJECTION 1 MG/0.1 ML
INJECTION | OPHTHALMIC | Status: DC | PRN
  Administered 2017-11-13: 0.1 mL via OPHTHALMIC

## 2017-11-13 MED ORDER — ACETAMINOPHEN 325 MG PO TABS: 650.0000 mg | ORAL_TABLET | Freq: Once | ORAL | Status: DC | PRN

## 2017-11-13 MED ORDER — MIDAZOLAM HCL 2 MG/2ML IJ SOLN
INTRAMUSCULAR | Status: DC | PRN
  Administered 2017-11-13: 2 mg via INTRAVENOUS

## 2017-11-13 MED ORDER — EPINEPHRINE PF 1 MG/ML IJ SOLN
INTRAMUSCULAR | Status: DC | PRN
  Administered 2017-11-13: 60 mL via OPHTHALMIC

## 2017-11-13 MED ORDER — FENTANYL CITRATE (PF) 100 MCG/2ML IJ SOLN
INTRAMUSCULAR | Status: DC | PRN
  Administered 2017-11-13: 50 ug via INTRAVENOUS

## 2017-11-13 MED ORDER — ARMC OPHTHALMIC DILATING DROPS
1.0000 "application " | OPHTHALMIC | Status: DC | PRN
  Administered 2017-11-13 (×3): 1 via OPHTHALMIC

## 2017-11-13 MED ORDER — ONDANSETRON HCL 4 MG/2ML IJ SOLN: 4.0000 mg | Freq: Once | INTRAMUSCULAR | Status: DC | PRN

## 2017-11-13 MED ORDER — ACETAMINOPHEN 160 MG/5ML PO SOLN: 325.0000 mg | ORAL | Status: DC | PRN

## 2017-11-13 MED ORDER — LACTATED RINGERS IV SOLN: INTRAVENOUS | Status: DC

## 2017-11-13 MED ORDER — NA HYALUR & NA CHOND-NA HYALUR 0.4-0.35 ML IO KIT
PACK | INTRAOCULAR | Status: DC | PRN
  Administered 2017-11-13: 1 mL via INTRAOCULAR

## 2017-11-13 MED ORDER — LIDOCAINE HCL (PF) 2 % IJ SOLN
INTRAOCULAR | Status: DC | PRN
  Administered 2017-11-13: 08:00:00 via INTRAOCULAR

## 2017-11-13 SURGICAL SUPPLY — 26 items
CANNULA ANT/CHMB 27GA (MISCELLANEOUS) ×3 IMPLANT
CARTRIDGE ABBOTT (MISCELLANEOUS) IMPLANT
GLOVE SURG LX 7.5 STRW (GLOVE) ×2
GLOVE SURG LX STRL 7.5 STRW (GLOVE) ×1 IMPLANT
GLOVE SURG TRIUMPH 8.0 PF LTX (GLOVE) ×3 IMPLANT
GOWN STRL REUS W/ TWL LRG LVL3 (GOWN DISPOSABLE) ×2 IMPLANT
GOWN STRL REUS W/TWL LRG LVL3 (GOWN DISPOSABLE) ×6
LENS IOL TECNIS ITEC 21.0 (Intraocular Lens) ×2 IMPLANT
MARKER SKIN DUAL TIP RULER LAB (MISCELLANEOUS) ×3 IMPLANT
NDL FILTER BLUNT 18X1 1/2 (NEEDLE) ×1 IMPLANT
NDL RETROBULBAR .5 NSTRL (NEEDLE) IMPLANT
NEEDLE FILTER BLUNT 18X 1/2SAF (NEEDLE) ×2
NEEDLE FILTER BLUNT 18X1 1/2 (NEEDLE) ×1 IMPLANT
PACK CATARACT BRASINGTON (MISCELLANEOUS) ×3 IMPLANT
PACK EYE AFTER SURG (MISCELLANEOUS) ×3 IMPLANT
PACK OPTHALMIC (MISCELLANEOUS) ×3 IMPLANT
RING MALYGIN 7.0 (MISCELLANEOUS) ×3 IMPLANT
SUT ETHILON 10-0 CS-B-6CS-B-6 (SUTURE)
SUT VICRYL  9 0 (SUTURE)
SUT VICRYL 9 0 (SUTURE) IMPLANT
SUTURE EHLN 10-0 CS-B-6CS-B-6 (SUTURE) IMPLANT
SYR 3ML LL SCALE MARK (SYRINGE) ×3 IMPLANT
SYR 5ML LL (SYRINGE) ×3 IMPLANT
SYR TB 1ML LUER SLIP (SYRINGE) ×3 IMPLANT
WATER STERILE IRR 250ML POUR (IV SOLUTION) ×3 IMPLANT
WIPE NON LINTING 3.25X3.25 (MISCELLANEOUS) ×3 IMPLANT

## 2017-11-13 NOTE — Op Note (Signed)
OPERATIVE NOTE  Daniel Sherman [id number] 11/13/2017  PREOPERATIVE DIAGNOSIS:   Nuclear sclerotic cataract left eye with miotic pupil      H25.12   POSTOPERATIVE DIAGNOSIS:   Nuclear sclerotic cataract left eye with miotic pupil.     PROCEDURE:  Phacoemulsification with posterior chamber intraocular lens implantation of the left eye which required pupil stretching with the Malyugin pupil expansion device   LENS:   Implant Name Type Inv. Item Serial No. Manufacturer Lot No. LRB No. Used  LENS IOL DIOP 21.0 - [id number] Intraocular Lens LENS IOL DIOP 21.0 [id number] AMO  Left 1        ULTRASOUND TIME: 17 % of 1 minutes, 9 seconds.  CDE 11.8   SURGEON:  Wyonia Hough, MD   ANESTHESIA: Topical with tetracaine drops and 2% Xylocaine jelly, augmented with 1% preservative-free intracameral lidocaine.   COMPLICATIONS:  None.   DESCRIPTION OF PROCEDURE:  The patient was identified in the holding room and transported to the operating room and placed in the supine position under the operating microscope.  The left eye was identified as the operative eye and it was prepped and draped in the usual sterile ophthalmic fashion.   A 1 millimeter clear-corneal paracentesis was made at the 1:30 position.  The anterior chamber was filled with Viscoat viscoelastic.  0.5 ml of preservative-free 1% lidocaine was injected into the anterior chamber.  A 2.4 millimeter keratome was used to make a near-clear corneal incision at the 10:30 position.  A Malyugin pupil expander was then placed through the main incision and into the anterior chamber of the eye.  The edge of the iris was secured on the lip of the pupil expander and it was released, thereby expanding the pupil to approximately 8 millimeters for completion of the cataract surgery.  Additional Viscoat was placed in the anterior chamber.  A cystotome and capsulorrhexis forceps were used to make a curvilinear capsulorrhexis.   Balanced salt solution was  used to hydrodissect and hydrodelineate the lens nucleus.   Phacoemulsification was used in stop and chop fashion to remove the lens, nucleus and epinucleus.  The remaining cortex was aspirated using the irrigation aspiration handpiece.  Additional Provisc was placed into the eye to distend the capsular bag for lens placement.  A lens was then injected into the capsular bag.  The pupil expanding ring was removed using a Kuglen hook and insertion device. The remaining viscoelastic was aspirated from the capsular bag and the anterior chamber.  The anterior chamber was filled with balanced salt solution to inflate to a physiologic pressure.   Wounds were hydrated with balanced salt solution.  The anterior chamber was inflated to a physiologic pressure with balanced salt solution.  No wound leaks were noted. Cefuroxime 0.1 ml of a 10mg /ml solution was injected into the anterior chamber for a dose of 1 mg of intracameral antibiotic at the completion of the case.   Timolol and Brimonidine drops were applied to the eye.  The patient was taken to the recovery room in stable condition without complications of anesthesia or surgery.  Keanen Dohse 11/13/2017, 8:38 AM

## 2017-11-13 NOTE — Anesthesia Postprocedure Evaluation (Signed)
Anesthesia Post Note  Patient: Daniel Sherman  Procedure(s) Performed: CATARACT EXTRACTION PHACO AND INTRAOCULAR LENS PLACEMENT (IOC)  LEFT DIABETIC (Left )  Patient location during evaluation: PACU Anesthesia Type: General Level of consciousness: awake and alert, oriented and patient cooperative Pain management: pain level controlled Vital Signs Assessment: post-procedure vital signs reviewed and stable Respiratory status: spontaneous breathing, nonlabored ventilation and respiratory function stable Cardiovascular status: blood pressure returned to baseline and stable Postop Assessment: adequate PO intake Anesthetic complications: no    Darrin Nipper

## 2017-11-13 NOTE — H&P (Signed)
The History and Physical notes are on paper, have been signed, and are to be scanned. The patient remains stable and unchanged from the H&P.   Previous H&P reviewed, patient examined, and there are no changes.  Daniel Sherman 11/13/2017 7:36 AM

## 2017-11-13 NOTE — Anesthesia Procedure Notes (Signed)
Procedure Name: MAC Date/Time: 11/13/2017 8:17 AM Performed by: Janna Arch, CRNA Pre-anesthesia Checklist: Patient identified, Emergency Drugs available, Suction available and Patient being monitored Patient Re-evaluated:Patient Re-evaluated prior to induction Oxygen Delivery Method: Nasal cannula

## 2017-11-13 NOTE — Anesthesia Preprocedure Evaluation (Signed)
Anesthesia Evaluation  Patient identified by MRN, date of birth, ID band Patient awake    History of Anesthesia Complications Negative for: history of anesthetic complications  Airway Mallampati: I  TM Distance: >3 FB Neck ROM: Full    Dental  (+) Lower Dentures, Upper Dentures   Pulmonary neg pulmonary ROS,    Pulmonary exam normal breath sounds clear to auscultation       Cardiovascular hypertension, + Past MI (1984)  Normal cardiovascular exam Rhythm:Regular Rate:Normal     Neuro/Psych  Headaches,    GI/Hepatic GERD  ,  Endo/Other  diabetes, Type 2, Insulin Dependent  Renal/GU negative Renal ROS     Musculoskeletal  (+) Arthritis , Osteoarthritis,    Abdominal   Peds  Hematology negative hematology ROS (+)   Anesthesia Other Findings   Reproductive/Obstetrics                             Anesthesia Physical Anesthesia Plan  ASA: III  Anesthesia Plan: General   Post-op Pain Management:    Induction: Intravenous  PONV Risk Score and Plan: 1 and Midazolam  Airway Management Planned: Natural Airway  Additional Equipment:   Intra-op Plan:   Post-operative Plan:   Informed Consent: I have reviewed the patients History and Physical, chart, labs and discussed the procedure including the risks, benefits and alternatives for the proposed anesthesia with the patient or authorized representative who has indicated his/her understanding and acceptance.     Plan Discussed with: CRNA  Anesthesia Plan Comments:         Anesthesia Quick Evaluation

## 2017-11-13 NOTE — Transfer of Care (Signed)
Immediate Anesthesia Transfer of Care Note  Patient: Daniel Sherman  Procedure(s) Performed: CATARACT EXTRACTION PHACO AND INTRAOCULAR LENS PLACEMENT (IOC)  LEFT DIABETIC (Left )  Patient Location: PACU  Anesthesia Type: General  Level of Consciousness: awake, alert  and patient cooperative  Airway and Oxygen Therapy: Patient Spontanous Breathing and Patient connected to supplemental oxygen  Post-op Assessment: Post-op Vital signs reviewed, Patient's Cardiovascular Status Stable, Respiratory Function Stable, Patent Airway and No signs of Nausea or vomiting  Post-op Vital Signs: Reviewed and stable  Complications: No apparent anesthesia complications

## 2017-11-14 ENCOUNTER — Encounter: Payer: Self-pay | Admitting: Ophthalmology

## 2017-12-31 ENCOUNTER — Encounter: Payer: Self-pay | Admitting: *Deleted

## 2017-12-31 ENCOUNTER — Other Ambulatory Visit: Payer: Self-pay

## 2018-01-27 NOTE — Discharge Instructions (Signed)

## 2018-01-28 NOTE — Anesthesia Preprocedure Evaluation (Addendum)
Anesthesia Evaluation  Patient identified by MRN, date of birth, ID band Patient awake    History of Anesthesia Complications Negative for: history of anesthetic complications  Airway Mallampati: I  TM Distance: >3 FB Neck ROM: Full    Dental  (+) Lower Dentures, Upper Dentures   Pulmonary neg pulmonary ROS,    Pulmonary exam normal breath sounds clear to auscultation       Cardiovascular Exercise Tolerance: Good hypertension, + Past MI (1984)  Normal cardiovascular exam Rhythm:Regular Rate:Normal     Neuro/Psych  Headaches,    GI/Hepatic GERD  ,  Endo/Other  diabetes, Type 2, Insulin Dependent  Renal/GU negative Renal ROS     Musculoskeletal  (+) Arthritis , Osteoarthritis,    Abdominal   Peds  Hematology negative hematology ROS (+)   Anesthesia Other Findings   Reproductive/Obstetrics                            Anesthesia Physical  Anesthesia Plan  ASA: III  Anesthesia Plan: MAC   Post-op Pain Management:    Induction: Intravenous  PONV Risk Score and Plan: 1 and Midazolam  Airway Management Planned: Natural Airway  Additional Equipment:   Intra-op Plan:   Post-operative Plan:   Informed Consent: I have reviewed the patients History and Physical, chart, labs and discussed the procedure including the risks, benefits and alternatives for the proposed anesthesia with the patient or authorized representative who has indicated his/her understanding and acceptance.     Plan Discussed with: CRNA  Anesthesia Plan Comments:         Anesthesia Quick Evaluation

## 2018-01-29 ENCOUNTER — Ambulatory Visit
Admission: RE | Admit: 2018-01-29 | Discharge: 2018-01-29 | Disposition: A | Discharge status: Home / Self Care | Payer: Medicare PPO | Source: Ambulatory Visit | Attending: Ophthalmology | Admitting: Ophthalmology

## 2018-01-29 ENCOUNTER — Ambulatory Visit: Payer: Medicare PPO | Admitting: Anesthesiology

## 2018-01-29 ENCOUNTER — Encounter
Admission: RE | Disposition: A | Discharge status: Home / Self Care | Payer: Self-pay | Source: Ambulatory Visit | Attending: Ophthalmology

## 2018-01-29 DIAGNOSIS — H9190 Unspecified hearing loss, unspecified ear: Secondary | ICD-10-CM | POA: Diagnosis not present

## 2018-01-29 DIAGNOSIS — H2511 Age-related nuclear cataract, right eye: Secondary | ICD-10-CM | POA: Insufficient documentation

## 2018-01-29 DIAGNOSIS — E7800 Pure hypercholesterolemia, unspecified: Secondary | ICD-10-CM | POA: Diagnosis not present

## 2018-01-29 DIAGNOSIS — F0390 Unspecified dementia without behavioral disturbance: Secondary | ICD-10-CM | POA: Diagnosis not present

## 2018-01-29 DIAGNOSIS — E1136 Type 2 diabetes mellitus with diabetic cataract: Secondary | ICD-10-CM | POA: Diagnosis not present

## 2018-01-29 DIAGNOSIS — K219 Gastro-esophageal reflux disease without esophagitis: Secondary | ICD-10-CM | POA: Diagnosis not present

## 2018-01-29 DIAGNOSIS — H5703 Miosis: Secondary | ICD-10-CM | POA: Insufficient documentation

## 2018-01-29 DIAGNOSIS — Z79899 Other long term (current) drug therapy: Secondary | ICD-10-CM | POA: Insufficient documentation

## 2018-01-29 DIAGNOSIS — I10 Essential (primary) hypertension: Secondary | ICD-10-CM | POA: Diagnosis not present

## 2018-01-29 DIAGNOSIS — Z794 Long term (current) use of insulin: Secondary | ICD-10-CM | POA: Insufficient documentation

## 2018-01-29 DIAGNOSIS — Z7982 Long term (current) use of aspirin: Secondary | ICD-10-CM | POA: Insufficient documentation

## 2018-01-29 HISTORY — PX: CATARACT EXTRACTION W/PHACO: SHX586

## 2018-01-29 LAB — GLUCOSE, CAPILLARY: Glucose-Capillary: 87 mg/dL (ref 65–99)

## 2018-01-29 SURGERY — PHACOEMULSIFICATION, CATARACT, WITH IOL INSERTION
Anesthesia: Monitor Anesthesia Care | Site: Eye | Laterality: Right | Wound class: Clean

## 2018-01-29 MED ORDER — MOXIFLOXACIN HCL 0.5 % OP SOLN
1.0000 [drp] | OPHTHALMIC | Status: DC | PRN
  Administered 2018-01-29 (×3): 1 [drp] via OPHTHALMIC

## 2018-01-29 MED ORDER — ACETAMINOPHEN 325 MG PO TABS: 650.0000 mg | ORAL_TABLET | Freq: Once | ORAL | Status: DC | PRN

## 2018-01-29 MED ORDER — ARMC OPHTHALMIC DILATING DROPS
1.0000 "application " | OPHTHALMIC | Status: DC | PRN
  Administered 2018-01-29 (×3): 1 via OPHTHALMIC

## 2018-01-29 MED ORDER — MIDAZOLAM HCL 2 MG/2ML IJ SOLN
INTRAMUSCULAR | Status: DC | PRN
  Administered 2018-01-29: 1 mg via INTRAVENOUS

## 2018-01-29 MED ORDER — LIDOCAINE HCL (PF) 2 % IJ SOLN
INTRAOCULAR | Status: DC | PRN
  Administered 2018-01-29: 2 mL via INTRAMUSCULAR

## 2018-01-29 MED ORDER — EPINEPHRINE PF 1 MG/ML IJ SOLN
INTRAOCULAR | Status: DC | PRN
  Administered 2018-01-29: 49 mL via OPHTHALMIC

## 2018-01-29 MED ORDER — CEFUROXIME OPHTHALMIC INJECTION 1 MG/0.1 ML
INJECTION | OPHTHALMIC | Status: DC | PRN
  Administered 2018-01-29: .3 mL via OPHTHALMIC

## 2018-01-29 MED ORDER — ONDANSETRON HCL 4 MG/2ML IJ SOLN: 4.0000 mg | Freq: Once | INTRAMUSCULAR | Status: DC | PRN

## 2018-01-29 MED ORDER — ACETAMINOPHEN 160 MG/5ML PO SOLN: 325.0000 mg | ORAL | Status: DC | PRN

## 2018-01-29 MED ORDER — BRIMONIDINE TARTRATE-TIMOLOL 0.2-0.5 % OP SOLN
OPHTHALMIC | Status: DC | PRN
  Administered 2018-01-29: 1 [drp] via OPHTHALMIC

## 2018-01-29 MED ORDER — FENTANYL CITRATE (PF) 100 MCG/2ML IJ SOLN
INTRAMUSCULAR | Status: DC | PRN
  Administered 2018-01-29: 50 ug via INTRAVENOUS

## 2018-01-29 MED ORDER — LACTATED RINGERS IV SOLN: INTRAVENOUS | Status: DC

## 2018-01-29 MED ORDER — NA HYALUR & NA CHOND-NA HYALUR 0.4-0.35 ML IO KIT
PACK | INTRAOCULAR | Status: DC | PRN
  Administered 2018-01-29: 1 mL via INTRAOCULAR

## 2018-01-29 SURGICAL SUPPLY — 27 items
CANNULA ANT/CHMB 27G (MISCELLANEOUS) ×1 IMPLANT
CANNULA ANT/CHMB 27GA (MISCELLANEOUS) ×3 IMPLANT
CARTRIDGE ABBOTT (MISCELLANEOUS) IMPLANT
GLOVE SURG LX 7.5 STRW (GLOVE) ×2
GLOVE SURG LX STRL 7.5 STRW (GLOVE) ×1 IMPLANT
GLOVE SURG TRIUMPH 8.0 PF LTX (GLOVE) ×3 IMPLANT
GOWN STRL REUS W/ TWL LRG LVL3 (GOWN DISPOSABLE) ×2 IMPLANT
GOWN STRL REUS W/TWL LRG LVL3 (GOWN DISPOSABLE) ×6
LENS IOL TECNIS ITEC 21.0 (Intraocular Lens) ×3 IMPLANT
MARKER SKIN DUAL TIP RULER LAB (MISCELLANEOUS) ×3 IMPLANT
NDL FILTER BLUNT 18X1 1/2 (NEEDLE) ×1 IMPLANT
NDL RETROBULBAR .5 NSTRL (NEEDLE) IMPLANT
NEEDLE FILTER BLUNT 18X 1/2SAF (NEEDLE) ×2
NEEDLE FILTER BLUNT 18X1 1/2 (NEEDLE) ×1 IMPLANT
PACK CATARACT BRASINGTON (MISCELLANEOUS) ×3 IMPLANT
PACK EYE AFTER SURG (MISCELLANEOUS) ×3 IMPLANT
PACK OPTHALMIC (MISCELLANEOUS) ×3 IMPLANT
RING MALYGIN 7.0 (MISCELLANEOUS) ×2 IMPLANT
SUT ETHILON 10-0 CS-B-6CS-B-6 (SUTURE)
SUT VICRYL  9 0 (SUTURE)
SUT VICRYL 9 0 (SUTURE) IMPLANT
SUTURE EHLN 10-0 CS-B-6CS-B-6 (SUTURE) IMPLANT
SYR 3ML LL SCALE MARK (SYRINGE) ×3 IMPLANT
SYR 5ML LL (SYRINGE) ×3 IMPLANT
SYR TB 1ML LUER SLIP (SYRINGE) ×3 IMPLANT
WATER STERILE IRR 500ML POUR (IV SOLUTION) ×3 IMPLANT
WIPE NON LINTING 3.25X3.25 (MISCELLANEOUS) ×3 IMPLANT

## 2018-01-29 NOTE — Anesthesia Procedure Notes (Signed)
Procedure Name: MAC Performed by: Lind Guest, CRNA Pre-anesthesia Checklist: Patient identified, Emergency Drugs available, Suction available, Patient being monitored and Timeout performed Patient Re-evaluated:Patient Re-evaluated prior to induction Oxygen Delivery Method: Nasal cannula

## 2018-01-29 NOTE — H&P (Signed)
The History and Physical notes are on paper, have been signed, and are to be scanned. The patient remains stable and unchanged from the H&P.   Previous H&P reviewed, patient examined, and there are no changes.  Daniel Sherman 01/29/2018 7:44 AM

## 2018-01-29 NOTE — Op Note (Signed)
OPERATIVE NOTE  Daniel Sherman [id number] 01/29/2018   PREOPERATIVE DIAGNOSIS:    Nuclear Sclerotic Cataract Right eye with miotic pupil.        H25.11  POSTOPERATIVE DIAGNOSIS: Nuclear Sclerotic Cataract Right eye with miotic pupil.          PROCEDURE:  Phacoemusification with posterior chamber intraocular lens placement of the right eye which required pupil stretching with the Malyugin pupil expansion device.  LENS:   Implant Name Type Inv. Item Serial No. Manufacturer Lot No. LRB No. Used  LENS IOL DIOP 21.0 - [id number] Intraocular Lens LENS IOL DIOP 21.0 [id number]  Right 1       ULTRASOUND TIME: 20 % of 1 minutes 4 seconds, CDE 12.8  SURGEON:  Wyonia Hough, MD   ANESTHESIA:  Topical with tetracaine drops and 2% Xylocaine jelly, augmented with 1% preservative-free intracameral lidocaine.   COMPLICATIONS:  None.   DESCRIPTION OF PROCEDURE:  The patient was identified in the holding room and transported to the operating room and placed in the supine position under the operating microscope. Theright eye was identified as the operative eye and it was prepped and draped in the usual sterile ophthalmic fashion.   A 1 millimeter clear-corneal paracentesis was made at the 12:00 position.  0.5 ml of preservative-free 1% lidocaine was injected into the anterior chamber. The anterior chamber was filled with Viscoat viscoelastic.  A 2.4 millimeter keratome was used to make a near-clear corneal incision at the 9:00 position. A Malyugin pupil expander was then placed through the main incision and into the anterior chamber of the eye.  The edge of the iris was secured on the lip of the pupil expander and it was released, thereby expanding the pupil to approximately 8 millimeters for completion of the cataract surgery.  Additional Viscoat was placed in the anterior chamber.  A cystotome and capsulorrhexis forceps were used to make a curvilinear capsulorrhexis.   Balanced salt solution  was used to hydrodissect and hydrodelineate the lens nucleus.   Phacoemulsification was used in stop and chop fashion to remove the lens, nucleus and epinucleus.  The remaining cortex was aspirated using the irrigation aspiration handpiece.  Additional Provisc was placed into the eye to distend the capsular bag for lens placement.  A lens was then injected into the capsular bag.  The pupil expanding ring was removed using a Kuglen hook and insertion device. The remaining viscoelastic was aspirated from the capsular bag and the anterior chamber.  The anterior chamber was filled with balanced salt solution to inflate to a physiologic pressure.  Wounds were hydrated with balanced salt solution.  The anterior chamber was inflated to a physiologic pressure with balanced salt solution.  No wound leaks were noted.Cefuroxime 0.1 ml of a 10mg /ml solution was injected into the anterior chamber for a dose of 1 mg of intracameral antibiotic at the completion of the case. Timolol and Brimonidine drops were applied to the eye.  The patient was taken to the recovery room in stable condition without complications of anesthesia or surgery.  [name] 01/29/2018, 8:04 AM

## 2018-01-29 NOTE — Anesthesia Postprocedure Evaluation (Signed)
Anesthesia Post Note  Patient: Daniel Sherman  Procedure(s) Performed: CATARACT EXTRACTION PHACO AND INTRAOCULAR LENS PLACEMENT (IOC) COMPLICATED  RIGHT DIABETIC (Right Eye)  Patient location during evaluation: PACU Anesthesia Type: MAC Level of consciousness: awake and alert, oriented and patient cooperative Pain management: pain level controlled Vital Signs Assessment: post-procedure vital signs reviewed and stable Respiratory status: spontaneous breathing, nonlabored ventilation and respiratory function stable Cardiovascular status: blood pressure returned to baseline and stable Postop Assessment: adequate PO intake Anesthetic complications: no    Darrin Nipper

## 2018-01-29 NOTE — Transfer of Care (Signed)
Immediate Anesthesia Transfer of Care Note  Patient: Daniel Sherman  Procedure(s) Performed: CATARACT EXTRACTION PHACO AND INTRAOCULAR LENS PLACEMENT (IOC) COMPLICATED  RIGHT DIABETIC (Right Eye)  Patient Location: PACU  Anesthesia Type: MAC  Level of Consciousness: awake, alert  and patient cooperative  Airway and Oxygen Therapy: Patient Spontanous Breathing and Patient connected to supplemental oxygen  Post-op Assessment: Post-op Vital signs reviewed, Patient's Cardiovascular Status Stable, Respiratory Function Stable, Patent Airway and No signs of Nausea or vomiting  Post-op Vital Signs: Reviewed and stable  Complications: No apparent anesthesia complications

## 2018-03-20 ENCOUNTER — Other Ambulatory Visit (HOSPITAL_COMMUNITY): Payer: Self-pay | Admitting: Neurology

## 2018-03-20 DIAGNOSIS — R4189 Other symptoms and signs involving cognitive functions and awareness: Secondary | ICD-10-CM

## 2018-03-28 ENCOUNTER — Ambulatory Visit (HOSPITAL_COMMUNITY)
Admission: RE | Admit: 2018-03-28 | Discharge: 2018-03-28 | Disposition: A | Discharge status: Home / Self Care | Payer: Medicare PPO | Source: Ambulatory Visit | Attending: Neurology | Admitting: Neurology

## 2018-03-28 DIAGNOSIS — R4189 Other symptoms and signs involving cognitive functions and awareness: Secondary | ICD-10-CM | POA: Diagnosis not present

## 2018-03-28 DIAGNOSIS — I739 Peripheral vascular disease, unspecified: Secondary | ICD-10-CM | POA: Insufficient documentation

## 2018-03-28 DIAGNOSIS — G319 Degenerative disease of nervous system, unspecified: Secondary | ICD-10-CM | POA: Insufficient documentation

## 2019-07-24 ENCOUNTER — Other Ambulatory Visit: Payer: Self-pay

## 2019-07-24 ENCOUNTER — Encounter
Admission: RE | Admit: 2019-07-24 | Discharge: 2019-07-24 | Disposition: A | Discharge status: Home / Self Care | Payer: Medicare PPO | Source: Ambulatory Visit | Attending: Internal Medicine | Admitting: Internal Medicine

## 2019-07-24 DIAGNOSIS — K5790 Diverticulosis of intestine, part unspecified, without perforation or abscess without bleeding: Secondary | ICD-10-CM | POA: Diagnosis not present

## 2019-07-24 DIAGNOSIS — K649 Unspecified hemorrhoids: Secondary | ICD-10-CM | POA: Insufficient documentation

## 2019-07-24 DIAGNOSIS — K6389 Other specified diseases of intestine: Secondary | ICD-10-CM | POA: Insufficient documentation

## 2019-07-24 DIAGNOSIS — Z86010 Personal history of colonic polyps: Secondary | ICD-10-CM | POA: Insufficient documentation

## 2019-07-24 DIAGNOSIS — Z01812 Encounter for preprocedural laboratory examination: Secondary | ICD-10-CM | POA: Diagnosis not present

## 2019-07-24 DIAGNOSIS — Z20828 Contact with and (suspected) exposure to other viral communicable diseases: Secondary | ICD-10-CM | POA: Insufficient documentation

## 2019-07-24 DIAGNOSIS — D175 Benign lipomatous neoplasm of intra-abdominal organs: Secondary | ICD-10-CM | POA: Insufficient documentation

## 2019-07-24 LAB — SARS CORONAVIRUS 2 (TAT 6-24 HRS): SARS Coronavirus 2: NEGATIVE

## 2019-07-27 ENCOUNTER — Encounter: Payer: Self-pay | Admitting: *Deleted
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
## 2019-07-28 ENCOUNTER — Other Ambulatory Visit: Payer: Self-pay

## 2019-07-28 ENCOUNTER — Ambulatory Visit: Payer: Medicare PPO | Admitting: Anesthesiology

## 2019-07-28 ENCOUNTER — Encounter
Admission: RE | Disposition: A | Discharge status: Home / Self Care | Payer: Self-pay | Source: Home / Self Care | Attending: Internal Medicine

## 2019-07-28 ENCOUNTER — Ambulatory Visit
Admission: RE | Admit: 2019-07-28 | Discharge: 2019-07-28 | Disposition: A | Discharge status: Home / Self Care | Payer: Medicare PPO | Attending: Internal Medicine | Admitting: Internal Medicine

## 2019-07-28 DIAGNOSIS — I10 Essential (primary) hypertension: Secondary | ICD-10-CM | POA: Diagnosis not present

## 2019-07-28 DIAGNOSIS — K219 Gastro-esophageal reflux disease without esophagitis: Secondary | ICD-10-CM | POA: Diagnosis not present

## 2019-07-28 DIAGNOSIS — Z9842 Cataract extraction status, left eye: Secondary | ICD-10-CM | POA: Insufficient documentation

## 2019-07-28 DIAGNOSIS — M1990 Unspecified osteoarthritis, unspecified site: Secondary | ICD-10-CM | POA: Diagnosis not present

## 2019-07-28 DIAGNOSIS — K640 First degree hemorrhoids: Secondary | ICD-10-CM | POA: Diagnosis not present

## 2019-07-28 DIAGNOSIS — Z7982 Long term (current) use of aspirin: Secondary | ICD-10-CM | POA: Diagnosis not present

## 2019-07-28 DIAGNOSIS — C180 Malignant neoplasm of cecum: Secondary | ICD-10-CM | POA: Insufficient documentation

## 2019-07-28 DIAGNOSIS — I252 Old myocardial infarction: Secondary | ICD-10-CM | POA: Insufficient documentation

## 2019-07-28 DIAGNOSIS — K6389 Other specified diseases of intestine: Secondary | ICD-10-CM | POA: Diagnosis not present

## 2019-07-28 DIAGNOSIS — Z1211 Encounter for screening for malignant neoplasm of colon: Secondary | ICD-10-CM | POA: Insufficient documentation

## 2019-07-28 DIAGNOSIS — Z96642 Presence of left artificial hip joint: Secondary | ICD-10-CM | POA: Insufficient documentation

## 2019-07-28 DIAGNOSIS — D175 Benign lipomatous neoplasm of intra-abdominal organs: Secondary | ICD-10-CM | POA: Insufficient documentation

## 2019-07-28 DIAGNOSIS — Z9841 Cataract extraction status, right eye: Secondary | ICD-10-CM | POA: Diagnosis not present

## 2019-07-28 DIAGNOSIS — I2510 Atherosclerotic heart disease of native coronary artery without angina pectoris: Secondary | ICD-10-CM | POA: Diagnosis not present

## 2019-07-28 DIAGNOSIS — Z961 Presence of intraocular lens: Secondary | ICD-10-CM | POA: Insufficient documentation

## 2019-07-28 DIAGNOSIS — K5730 Diverticulosis of large intestine without perforation or abscess without bleeding: Secondary | ICD-10-CM | POA: Diagnosis not present

## 2019-07-28 DIAGNOSIS — E119 Type 2 diabetes mellitus without complications: Secondary | ICD-10-CM | POA: Insufficient documentation

## 2019-07-28 DIAGNOSIS — Z794 Long term (current) use of insulin: Secondary | ICD-10-CM | POA: Diagnosis not present

## 2019-07-28 DIAGNOSIS — Z86010 Personal history of colonic polyps: Secondary | ICD-10-CM | POA: Insufficient documentation

## 2019-07-28 DIAGNOSIS — Z79899 Other long term (current) drug therapy: Secondary | ICD-10-CM | POA: Insufficient documentation

## 2019-07-28 DIAGNOSIS — E785 Hyperlipidemia, unspecified: Secondary | ICD-10-CM | POA: Insufficient documentation

## 2019-07-28 HISTORY — DX: Atherosclerotic heart disease of native coronary artery without angina pectoris: I25.10

## 2019-07-28 HISTORY — PX: COLONOSCOPY WITH PROPOFOL: SHX5780

## 2019-07-28 HISTORY — DX: Hyperlipidemia, unspecified: E78.5

## 2019-07-28 LAB — GLUCOSE, CAPILLARY: Glucose-Capillary: 82 mg/dL (ref 70–99)

## 2019-07-28 SURGERY — COLONOSCOPY WITH PROPOFOL
Anesthesia: General

## 2019-07-28 MED ORDER — SODIUM CHLORIDE 0.9 % IV SOLN
INTRAVENOUS | Status: DC
  Administered 2019-07-28: 08:00:00 via INTRAVENOUS

## 2019-07-28 MED ORDER — PROPOFOL 10 MG/ML IV BOLUS
INTRAVENOUS | Status: AC
  Filled 2019-07-28: qty 20

## 2019-07-28 MED ORDER — PROPOFOL 10 MG/ML IV BOLUS
INTRAVENOUS | Status: DC | PRN
  Administered 2019-07-28: 100 mg via INTRAVENOUS

## 2019-07-28 MED ORDER — PROPOFOL 500 MG/50ML IV EMUL
INTRAVENOUS | Status: DC | PRN
  Administered 2019-07-28: 130 ug/kg/min via INTRAVENOUS

## 2019-07-28 MED ORDER — DEXTROSE-NACL 5-0.45 % IV SOLN
INTRAVENOUS | Status: DC
  Administered 2019-07-28: 08:00:00 via INTRAVENOUS

## 2019-07-28 MED ORDER — SPOT INK MARKER SYRINGE KIT
PACK | SUBMUCOSAL | Status: DC | PRN
  Administered 2019-07-28: 1 mL via SUBMUCOSAL

## 2019-07-28 MED ORDER — SPOT INK MARKER SYRINGE KIT
PACK | SUBMUCOSAL | Status: DC | PRN
  Administered 2019-07-28: 5 mL via SUBMUCOSAL

## 2019-07-28 MED ORDER — PROPOFOL 500 MG/50ML IV EMUL
INTRAVENOUS | Status: AC
  Filled 2019-07-28: qty 50

## 2019-07-28 NOTE — Interval H&P Note (Signed)
History and Physical Interval Note:  07/28/2019 8:20 AM  Daniel Sherman  has presented today for surgery, with the diagnosis of PERSONAL HX.OF COLON POLYPS.  The various methods of treatment have been discussed with the patient and family. After consideration of risks, benefits and other options for treatment, the patient has consented to  Procedure(s): COLONOSCOPY WITH PROPOFOL (N/A) as a surgical intervention.  The patient's history has been reviewed, patient examined, no change in status, stable for surgery.  I have reviewed the patient's chart and labs.  Questions were answered to the patient's satisfaction.     [place], [place]

## 2019-07-28 NOTE — Anesthesia Post-op Follow-up Note (Signed)
Anesthesia QCDR form completed.        

## 2019-07-28 NOTE — Transfer of Care (Signed)
Immediate Anesthesia Transfer of Care Note  Patient: Daniel Sherman  Procedure(s) Performed: COLONOSCOPY WITH PROPOFOL (N/A )  Patient Location: Endoscopy Unit  Anesthesia Type:General  Level of Consciousness: drowsy and patient cooperative  Airway & Oxygen Therapy: Patient Spontanous Breathing and Patient connected to nasal cannula oxygen  Post-op Assessment: Report given to RN and Post -op Vital signs reviewed and stable  Post vital signs: Reviewed and stable  Last Vitals:  Vitals Value Taken Time  BP 111/65 07/28/19 0850  Temp 36.9 C 07/28/19 0850  Pulse 73 07/28/19 0856  Resp 17 07/28/19 0856  SpO2 96 % 07/28/19 0856  Vitals shown include unvalidated device data.  Last Pain:  Vitals:   07/28/19 0737  TempSrc: Tympanic  PainSc: 0-No pain         Complications: No apparent anesthesia complications
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
## 2019-07-28 NOTE — Op Note (Signed)
[facility] Patient Name: Daniel Sherman Procedure Date: 07/28/2019 7:29 AM MRN: [id number] Account #: [id number] Date of Birth: 1941/04/04 Admit Type: Outpatient Age: 78 Room: [facility] ENDO ROOM 2 Gender: Male Note Status: Finalized Procedure:            Colonoscopy Indications:          High risk colon cancer surveillance: Personal history                        of colonic polyps Providers:            [name]. [name] MD, MD Referring MD:         Irven Easterly. Kary Kos, MD (Referring MD) Medicines:            Propofol per Anesthesia Complications:        No immediate complications. Procedure:            Pre-Anesthesia Assessment:                       - The risks and benefits of the procedure and the                        sedation options and risks were discussed with the                        patient. All questions were answered and informed                        consent was obtained.                       - Patient identification and proposed procedure were                        verified prior to the procedure by the nurse. The                        procedure was verified in the procedure room.                       - ASA Grade Assessment: III - A patient with severe                        systemic disease.                       - After reviewing the risks and benefits, the patient                        was deemed in satisfactory condition to undergo the                        procedure.                       After obtaining informed consent, the colonoscope was                        passed under direct vision. Throughout the procedure,  the patient's blood pressure, pulse, and oxygen                        saturations were monitored continuously. The                        Colonoscope was introduced through the anus and                        advanced to the the cecum, identified by appendiceal   orifice and ileocecal valve. The colonoscopy was                        performed without difficulty. The patient tolerated the                        procedure well. The quality of the bowel preparation                        was good. The ileocecal valve, appendiceal orifice, and                        rectum were photographed. Findings:      The perianal and digital rectal examinations were normal. Pertinent       negatives include normal sphincter tone and no palpable rectal lesions.      Multiple small and large-mouthed diverticula were found in the sigmoid       colon.      There was a small lipoma, 9 mm in diameter, in the mid ascending colon.      There was a medium-sized lipoma, 19 mm in diameter, in the proximal       ascending colon.      An ulcerated non-obstructing small mass was found in the cecum. The mass       was non-circumferential. The mass measured four cm in length. In       addition, its diameter measured four mm. No bleeding was present.       Biopsies were taken with a cold forceps for histology. Area was tattooed       with an injection of Spot (carbon black). 2 cc injected in base of       cecum; 4cc (1cc per quadrant) was injected 5cm distal to the mass.       Estimated blood loss was minimal.      A diffuse area of moderate melanosis was found in the entire colon.      Non-bleeding internal hemorrhoids were found during retroflexion. The       hemorrhoids were Grade I (internal hemorrhoids that do not prolapse).      The exam was otherwise without abnormality. Impression:           - Diverticulosis in the sigmoid colon.                       - Small lipoma in the mid ascending colon.                       - Medium-sized lipoma in the proximal ascending colon.                       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 Biopsied.  Tattooed.                       - Melanosis in the colon.                       - Non-bleeding internal  hemorrhoids.                       - The examination was otherwise normal. Recommendation:       - Patient has a contact number available for                        emergencies. The signs and symptoms of potential                        delayed complications were discussed with the patient.                        Return to normal activities tomorrow. Written discharge                        instructions were provided to the patient.                       - Resume previous diet.                       - Continue present medications.                       - Await pathology results.                       - Refer to a surgeon at appointment to be scheduled.                       - The findings and recommendations were discussed with                        the patient and their spouse. Procedure Code(s):    --- Professional ---                       [phone], Colonoscopy, flexible; with directed submucosal                        injection(s), any substance                       [id number], Colonoscopy, flexible; with biopsy, single or                        multiple Diagnosis Code(s):    --- Professional ---                       K57.30, Diverticulosis of large intestine without                        perforation or abscess without bleeding                       K63.89, Other specified diseases of intestine  D49.0, Neoplasm of unspecified behavior of digestive                        system                       D17.5, Benign lipomatous neoplasm of intra-abdominal                        organs                       K64.0, First degree hemorrhoids                       Z86.010, Personal history of colonic polyps CPT copyright 2019 [employer]. All rights reserved. The codes documented in this report are preliminary and upon coder review may  be revised to meet current compliance requirements. Efrain Sella MD, MD 07/28/2019 8:52:47 AM This report has been  signed electronically. Number of Addenda: 0 Note Initiated On: 07/28/2019 7:29 AM Scope Withdrawal Time: 0 hours 10 minutes 35 seconds  Total Procedure Duration: 0 hours 16 minutes 15 seconds  Estimated Blood Loss: Estimated blood loss was minimal.      [facility]

## 2019-07-28 NOTE — Anesthesia Postprocedure Evaluation (Signed)
Anesthesia Post Note  Patient: Daniel Sherman  Procedure(s) Performed: COLONOSCOPY WITH PROPOFOL (N/A )  Patient location during evaluation: PACU Anesthesia Type: General Level of consciousness: awake and alert Pain management: pain level controlled Vital Signs Assessment: post-procedure vital signs reviewed and stable Respiratory status: spontaneous breathing, nonlabored ventilation, respiratory function stable and patient connected to nasal cannula oxygen Cardiovascular status: blood pressure returned to baseline and stable Postop Assessment: no apparent nausea or vomiting Anesthetic complications: no     Last Vitals:  Vitals:   07/28/19 0850 07/28/19 0900  BP: 111/65 117/68  Pulse: 76 72  Resp: 19 17  Temp: 36.9 C   SpO2: 97% 97%    Last Pain:  Vitals:   07/28/19 0737  TempSrc: Tympanic  PainSc: 0-No pain                 Molli Barrows

## 2019-07-29 ENCOUNTER — Other Ambulatory Visit: Payer: Self-pay | Admitting: Anatomic Pathology & Clinical Pathology

## 2019-07-29 ENCOUNTER — Encounter: Payer: Self-pay | Admitting: Internal Medicine

## 2019-07-29 LAB — SURGICAL PATHOLOGY

## 2019-07-30 ENCOUNTER — Other Ambulatory Visit: Payer: Self-pay | Admitting: General Surgery

## 2019-07-30 ENCOUNTER — Other Ambulatory Visit (HOSPITAL_COMMUNITY): Payer: Self-pay | Admitting: General Surgery

## 2019-07-30 ENCOUNTER — Ambulatory Visit: Payer: Self-pay | Admitting: General Surgery

## 2019-07-30 DIAGNOSIS — C182 Malignant neoplasm of ascending colon: Secondary | ICD-10-CM
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
## 2019-07-31 ENCOUNTER — Encounter (INDEPENDENT_AMBULATORY_CARE_PROVIDER_SITE_OTHER): Payer: Self-pay

## 2019-07-31 ENCOUNTER — Encounter: Payer: Self-pay | Admitting: Oncology

## 2019-07-31 ENCOUNTER — Inpatient Hospital Stay: Payer: Medicare PPO | Attending: Oncology | Admitting: Oncology

## 2019-07-31 ENCOUNTER — Other Ambulatory Visit: Payer: Self-pay

## 2019-07-31 VITALS — BP 137/60 | HR 75 | Temp 97.5°F | Resp 18 | Ht 66.0 in | Wt 204.0 lb

## 2019-07-31 DIAGNOSIS — N183 Chronic kidney disease, stage 3 unspecified: Secondary | ICD-10-CM

## 2019-07-31 DIAGNOSIS — E119 Type 2 diabetes mellitus without complications: Secondary | ICD-10-CM

## 2019-07-31 DIAGNOSIS — E785 Hyperlipidemia, unspecified: Secondary | ICD-10-CM | POA: Diagnosis not present

## 2019-07-31 DIAGNOSIS — I252 Old myocardial infarction: Secondary | ICD-10-CM | POA: Insufficient documentation

## 2019-07-31 DIAGNOSIS — C180 Malignant neoplasm of cecum: Secondary | ICD-10-CM | POA: Diagnosis present

## 2019-07-31 DIAGNOSIS — I10 Essential (primary) hypertension: Secondary | ICD-10-CM | POA: Diagnosis not present

## 2019-07-31 DIAGNOSIS — D696 Thrombocytopenia, unspecified: Secondary | ICD-10-CM | POA: Insufficient documentation

## 2019-07-31 DIAGNOSIS — Z809 Family history of malignant neoplasm, unspecified: Secondary | ICD-10-CM

## 2019-07-31 DIAGNOSIS — Z8042 Family history of malignant neoplasm of prostate: Secondary | ICD-10-CM | POA: Diagnosis not present

## 2019-07-31 DIAGNOSIS — Z79899 Other long term (current) drug therapy: Secondary | ICD-10-CM | POA: Diagnosis not present

## 2019-07-31 DIAGNOSIS — Z7982 Long term (current) use of aspirin: Secondary | ICD-10-CM | POA: Diagnosis not present

## 2019-07-31 DIAGNOSIS — Z800 Family history of malignant neoplasm of digestive organs: Secondary | ICD-10-CM | POA: Insufficient documentation
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
## 2019-07-31 NOTE — Progress Notes (Signed)
Pt here as new consult for colon cancer. Chart reviewed with pt's wife over the phone. Pt's wife would like to be called during visit as well.

## 2019-08-04 ENCOUNTER — Encounter
Admission: RE | Admit: 2019-08-04 | Discharge: 2019-08-04 | Disposition: A | Discharge status: Home / Self Care | Payer: Medicare PPO | Source: Ambulatory Visit | Attending: General Surgery | Admitting: General Surgery

## 2019-08-04 ENCOUNTER — Other Ambulatory Visit: Payer: Self-pay

## 2019-08-04 DIAGNOSIS — C182 Malignant neoplasm of ascending colon: Secondary | ICD-10-CM | POA: Diagnosis present

## 2019-08-04 HISTORY — DX: Other amnesia: R41.3

## 2019-08-04 NOTE — Patient Instructions (Signed)
Your procedure is scheduled on: Wed. 9/9 Report to Day Surgery. To find out your arrival time please call [phone] between 1PM - 3PM on Tues 9/8.  Remember: Instructions that are not followed completely may result in serious medical risk,  up to and including death, or upon the discretion of your surgeon and anesthesiologist your  surgery may need to be rescheduled.     _X__ 1. Do not eat food after midnight the night before your procedure.                 No gum chewing or hard candies. You may drink clear liquids up to 2 hours                 before you are scheduled to arrive for your surgery- DO not drink clear                 liquids within 2 hours of the start of your surgery.                 Clear Liquids include:  water, apple juice without pulp, clear carbohydrate                 drink such as Clearfast of Gatorade, Black Coffee or Tea (Do not add                 anything to coffee or tea).  __X__2.  On the morning of surgery brush your teeth with toothpaste and water, you                may rinse your mouth with mouthwash if you wish.  Do not swallow any toothpaste of mouthwash.     ___ 3.  No Alcohol for 24 hours before or after surgery.   ___ 4.  Do Not Smoke or use e-cigarettes For 24 Hours Prior to Your Surgery.                 Do not use any chewable tobacco products for at least 6 hours prior to                 surgery.  ____  5.  Bring all medications with you on the day of surgery if instructed.   __x__  6.  Notify your doctor if there is any change in your medical condition      (cold, fever, infections).     Do not wear jewelry, make-up, hairpins, clips or nail polish. Do not wear lotions, powders, or perfumes. You may wear deodorant. Do not shave 48 hours prior to surgery. Men may shave face and neck. Do not bring valuables to the hospital.    [facility] is not responsible for any belongings or valuables.  Contacts, dentures or  bridgework may not be worn into surgery. Leave your suitcase in the car. After surgery it may be brought to your room. For patients admitted to the hospital, discharge time is determined by your treatment team.   Patients discharged the day of surgery will not be allowed to drive home.   Please read over the following fact sheets that you were given:    _x___ Take these medicines the morning of surgery with A SIP OF WATER:    1. omeprazole (PRILOSEC) 40 MG capsule dose the night before and the morning   2.   3.   4.  5.  6.  ____ Fleet Enema (as directed)   __x__ Use  CHG Soap as directed  ____ Use inhalers on the day of surgery  __x__ Stop metformin 2 days prior to surgery Sunday 9/6    __x__ Take 1/2 of usual insulin dose the night before surgery. No insulin the morning          of surgery.   __x__ Stop aspirin on Thurs 9/3 last dose  __x__ Stop Anti-inflammatories ibuprofen aleve or aspirin  today   ____ Stop supplements until after surgery.    ____ Bring C-Pap to the hospital.

## 2019-08-06 ENCOUNTER — Other Ambulatory Visit: Payer: Medicare PPO

## 2019-08-06 ENCOUNTER — Other Ambulatory Visit: Payer: Self-pay

## 2019-08-06 ENCOUNTER — Ambulatory Visit
Admission: RE | Admit: 2019-08-06 | Discharge: 2019-08-06 | Disposition: A | Discharge status: Home / Self Care | Payer: Medicare PPO | Source: Ambulatory Visit | Attending: General Surgery | Admitting: General Surgery

## 2019-08-06 DIAGNOSIS — C182 Malignant neoplasm of ascending colon: Secondary | ICD-10-CM | POA: Diagnosis not present

## 2019-08-06 MED ORDER — IOHEXOL 300 MG/ML  SOLN
80.0000 mL | Freq: Once | INTRAMUSCULAR | Status: AC | PRN
  Administered 2019-08-06: 75 mL via INTRAVENOUS

## 2019-08-06 NOTE — Progress Notes (Signed)
Tumor Board Documentation  MAUD TORREZ was presented by Dr Tasia Catchings at our Tumor Board on 08/06/2019, which included representatives from medical oncology, radiation oncology, pathology, radiology, surgical, navigation, internal medicine, pharmacy, pulmonology, palliative care, research.  Joenathan currently presents as a new patient, for [place], for new positive pathology with history of the following treatments: active survellience, surgical intervention(s).  Additionally, we reviewed previous medical and familial history, history of present illness, and recent lab results along with all available histopathologic and imaging studies. The tumor board considered available treatment options and made the following recommendations: Surgery    The following procedures/referrals were also placed: No orders of the defined types were placed in this encounter.   Clinical Trial Status:     Staging used: To be determined  AJCC Staging:       Group: Carcinoma of colon  National site-specific guidelines   were discussed with respect to the case.  Tumor board is a meeting of clinicians from various specialty areas who evaluate and discuss patients for whom a multidisciplinary approach is being considered. Final determinations in the plan of care are those of the provider(s). The responsibility for follow up of recommendations given during tumor board is that of the provider.   Today's extended care, comprehensive team conference, [place] was not present for the discussion and was not examined.   Multidisciplinary Tumor Board is a multidisciplinary case peer review process.  Decisions discussed in the Multidisciplinary Tumor Board reflect the opinions of the specialists present at the conference without having examined the patient.  Ultimately, treatment and diagnostic decisions rest with the primary provider(s) and the patient.

## 2019-08-07 ENCOUNTER — Other Ambulatory Visit
Admission: RE | Admit: 2019-08-07 | Discharge: 2019-08-07 | Disposition: A | Discharge status: Home / Self Care | Payer: Medicare PPO | Source: Ambulatory Visit | Attending: General Surgery | Admitting: General Surgery

## 2019-08-07 DIAGNOSIS — C182 Malignant neoplasm of ascending colon: Secondary | ICD-10-CM | POA: Diagnosis not present

## 2019-08-07 LAB — SARS CORONAVIRUS 2 (TAT 6-24 HRS): SARS Coronavirus 2: NEGATIVE

## 2019-08-11 ENCOUNTER — Encounter: Payer: Self-pay | Admitting: Anesthesiology

## 2019-08-11 MED ORDER — SODIUM CHLORIDE 0.9 % IV SOLN
2.0000 g | INTRAVENOUS | Status: AC
  Administered 2019-08-12: 2 g via INTRAVENOUS
  Filled 2019-08-11: qty 2

## 2019-08-12 ENCOUNTER — Inpatient Hospital Stay: Payer: Medicare PPO | Admitting: Anesthesiology

## 2019-08-12 ENCOUNTER — Inpatient Hospital Stay
Admission: RE | Admit: 2019-08-12 | Discharge: 2019-08-15 | DRG: 331 | Disposition: A | Discharge status: Home / Self Care | Payer: Medicare PPO | Attending: General Surgery | Admitting: General Surgery

## 2019-08-12 ENCOUNTER — Other Ambulatory Visit: Payer: Self-pay

## 2019-08-12 ENCOUNTER — Encounter
Admission: RE | Disposition: A | Discharge status: Home / Self Care | Payer: Self-pay | Source: Home / Self Care | Attending: General Surgery

## 2019-08-12 DIAGNOSIS — C189 Malignant neoplasm of colon, unspecified: Secondary | ICD-10-CM | POA: Diagnosis present

## 2019-08-12 DIAGNOSIS — F0390 Unspecified dementia without behavioral disturbance: Secondary | ICD-10-CM | POA: Diagnosis present

## 2019-08-12 DIAGNOSIS — Z7982 Long term (current) use of aspirin: Secondary | ICD-10-CM

## 2019-08-12 DIAGNOSIS — C182 Malignant neoplasm of ascending colon: Principal | ICD-10-CM | POA: Diagnosis present

## 2019-08-12 DIAGNOSIS — Z96642 Presence of left artificial hip joint: Secondary | ICD-10-CM | POA: Diagnosis present

## 2019-08-12 DIAGNOSIS — E785 Hyperlipidemia, unspecified: Secondary | ICD-10-CM | POA: Diagnosis present

## 2019-08-12 DIAGNOSIS — Z794 Long term (current) use of insulin: Secondary | ICD-10-CM | POA: Diagnosis not present

## 2019-08-12 DIAGNOSIS — Z87891 Personal history of nicotine dependence: Secondary | ICD-10-CM

## 2019-08-12 DIAGNOSIS — I2510 Atherosclerotic heart disease of native coronary artery without angina pectoris: Secondary | ICD-10-CM | POA: Diagnosis present

## 2019-08-12 DIAGNOSIS — E109 Type 1 diabetes mellitus without complications: Secondary | ICD-10-CM | POA: Diagnosis present

## 2019-08-12 DIAGNOSIS — I10 Essential (primary) hypertension: Secondary | ICD-10-CM | POA: Diagnosis present

## 2019-08-12 DIAGNOSIS — K219 Gastro-esophageal reflux disease without esophagitis: Secondary | ICD-10-CM | POA: Diagnosis present

## 2019-08-12 DIAGNOSIS — I252 Old myocardial infarction: Secondary | ICD-10-CM | POA: Diagnosis not present

## 2019-08-12 DIAGNOSIS — Z79899 Other long term (current) drug therapy: Secondary | ICD-10-CM

## 2019-08-12 HISTORY — PX: LAPAROSCOPIC RIGHT COLECTOMY: SHX5925

## 2019-08-12 LAB — GLUCOSE, CAPILLARY
Glucose-Capillary: 100 mg/dL — ABNORMAL HIGH (ref 70–99)
Glucose-Capillary: 177 mg/dL — ABNORMAL HIGH (ref 70–99)
Glucose-Capillary: 188 mg/dL — ABNORMAL HIGH (ref 70–99)
Glucose-Capillary: 225 mg/dL — ABNORMAL HIGH (ref 70–99)

## 2019-08-12 SURGERY — COLECTOMY, RIGHT, LAPAROSCOPIC
Anesthesia: General | Site: Abdomen | Laterality: Right

## 2019-08-12 MED ORDER — FENTANYL CITRATE (PF) 250 MCG/5ML IJ SOLN
INTRAMUSCULAR | Status: AC
  Filled 2019-08-12: qty 5

## 2019-08-12 MED ORDER — BUPIVACAINE HCL (PF) 0.5 % IJ SOLN
INTRAMUSCULAR | Status: DC | PRN
  Administered 2019-08-12: 30 mL

## 2019-08-12 MED ORDER — DEXAMETHASONE SODIUM PHOSPHATE 10 MG/ML IJ SOLN
INTRAMUSCULAR | Status: DC | PRN
  Administered 2019-08-12: 10 mg via INTRAVENOUS

## 2019-08-12 MED ORDER — POLYVINYL ALCOHOL 1.4 % OP SOLN
1.0000 [drp] | Freq: Two times a day (BID) | OPHTHALMIC | Status: DC | PRN
  Filled 2019-08-12: qty 15

## 2019-08-12 MED ORDER — BUPIVACAINE HCL (PF) 0.5 % IJ SOLN
INTRAMUSCULAR | Status: AC
  Filled 2019-08-12: qty 30

## 2019-08-12 MED ORDER — INSULIN ASPART 100 UNIT/ML ~~LOC~~ SOLN
0.0000 [IU] | Freq: Three times a day (TID) | SUBCUTANEOUS | Status: DC
  Administered 2019-08-13 – 2019-08-14 (×2): 2 [IU] via SUBCUTANEOUS
  Filled 2019-08-12 (×3): qty 1

## 2019-08-12 MED ORDER — ONDANSETRON HCL 4 MG/2ML IJ SOLN
INTRAMUSCULAR | Status: DC | PRN
  Administered 2019-08-12 (×2): 4 mg via INTRAVENOUS

## 2019-08-12 MED ORDER — ROCURONIUM BROMIDE 50 MG/5ML IV SOLN
INTRAVENOUS | Status: AC
  Filled 2019-08-12: qty 4

## 2019-08-12 MED ORDER — CELECOXIB 200 MG PO CAPS
200.0000 mg | ORAL_CAPSULE | Freq: Two times a day (BID) | ORAL | Status: DC
  Administered 2019-08-12 – 2019-08-15 (×6): 200 mg via ORAL
  Filled 2019-08-12 (×7): qty 1

## 2019-08-12 MED ORDER — PROPOFOL 500 MG/50ML IV EMUL
INTRAVENOUS | Status: AC
  Filled 2019-08-12: qty 50

## 2019-08-12 MED ORDER — GABAPENTIN 300 MG PO CAPS
300.0000 mg | ORAL_CAPSULE | ORAL | Status: AC
  Administered 2019-08-12: 11:00:00 300 mg via ORAL

## 2019-08-12 MED ORDER — SUGAMMADEX SODIUM 500 MG/5ML IV SOLN
INTRAVENOUS | Status: DC | PRN
  Administered 2019-08-12: 200 mg via INTRAVENOUS

## 2019-08-12 MED ORDER — ONDANSETRON HCL 4 MG/2ML IJ SOLN: 4.0000 mg | Freq: Four times a day (QID) | INTRAMUSCULAR | Status: DC | PRN

## 2019-08-12 MED ORDER — CELECOXIB 200 MG PO CAPS
ORAL_CAPSULE | ORAL | Status: AC
  Administered 2019-08-12: 200 mg via ORAL
  Filled 2019-08-12: qty 1

## 2019-08-12 MED ORDER — PROPOFOL 10 MG/ML IV BOLUS
INTRAVENOUS | Status: AC
  Filled 2019-08-12: qty 40

## 2019-08-12 MED ORDER — SUGAMMADEX SODIUM 200 MG/2ML IV SOLN
INTRAVENOUS | Status: AC
  Filled 2019-08-12: qty 2

## 2019-08-12 MED ORDER — BUPIVACAINE LIPOSOME 1.3 % IJ SUSP
INTRAMUSCULAR | Status: DC | PRN
  Administered 2019-08-12: 20 mL

## 2019-08-12 MED ORDER — PANTOPRAZOLE SODIUM 40 MG IV SOLR
40.0000 mg | Freq: Every day | INTRAVENOUS | Status: DC
  Administered 2019-08-12 – 2019-08-14 (×3): 40 mg via INTRAVENOUS
  Filled 2019-08-12 (×3): qty 40

## 2019-08-12 MED ORDER — PROPOFOL 10 MG/ML IV BOLUS
INTRAVENOUS | Status: DC | PRN
  Administered 2019-08-12: 120 mg via INTRAVENOUS

## 2019-08-12 MED ORDER — ACETAMINOPHEN 500 MG PO TABS
1000.0000 mg | ORAL_TABLET | ORAL | Status: AC
  Administered 2019-08-12: 11:00:00 1000 mg via ORAL

## 2019-08-12 MED ORDER — GABAPENTIN 300 MG PO CAPS
300.0000 mg | ORAL_CAPSULE | Freq: Two times a day (BID) | ORAL | Status: DC
  Administered 2019-08-12 – 2019-08-15 (×6): 300 mg via ORAL
  Filled 2019-08-12 (×6): qty 1

## 2019-08-12 MED ORDER — GABAPENTIN 300 MG PO CAPS
ORAL_CAPSULE | ORAL | Status: AC
  Administered 2019-08-12: 11:00:00 300 mg via ORAL
  Filled 2019-08-12: qty 1

## 2019-08-12 MED ORDER — ALVIMOPAN 12 MG PO CAPS
12.0000 mg | ORAL_CAPSULE | ORAL | Status: AC
  Administered 2019-08-12: 11:00:00 12 mg via ORAL

## 2019-08-12 MED ORDER — ACETAMINOPHEN 500 MG PO TABS
ORAL_TABLET | ORAL | Status: AC
  Administered 2019-08-12: 1000 mg via ORAL
  Filled 2019-08-12: qty 2

## 2019-08-12 MED ORDER — SODIUM CHLORIDE 0.9 % IV SOLN
INTRAVENOUS | Status: AC
  Filled 2019-08-12: qty 2

## 2019-08-12 MED ORDER — FENTANYL CITRATE (PF) 100 MCG/2ML IJ SOLN
INTRAMUSCULAR | Status: AC
  Filled 2019-08-12: qty 2

## 2019-08-12 MED ORDER — SUGAMMADEX SODIUM 500 MG/5ML IV SOLN
INTRAVENOUS | Status: AC
  Filled 2019-08-12: qty 5

## 2019-08-12 MED ORDER — MIDAZOLAM HCL 2 MG/2ML IJ SOLN
INTRAMUSCULAR | Status: DC | PRN
  Administered 2019-08-12: 1 mg via INTRAVENOUS

## 2019-08-12 MED ORDER — BUPIVACAINE LIPOSOME 1.3 % IJ SUSP
INTRAMUSCULAR | Status: AC
  Filled 2019-08-12: qty 20

## 2019-08-12 MED ORDER — HYDROCHLOROTHIAZIDE 25 MG PO TABS
12.5000 mg | ORAL_TABLET | Freq: Every evening | ORAL | Status: DC
  Administered 2019-08-12 – 2019-08-14 (×3): 12.5 mg via ORAL
  Filled 2019-08-12 (×3): qty 1

## 2019-08-12 MED ORDER — LIDOCAINE HCL (CARDIAC) PF 100 MG/5ML IV SOSY
PREFILLED_SYRINGE | INTRAVENOUS | Status: DC | PRN
  Administered 2019-08-12: 60 mg via INTRAVENOUS

## 2019-08-12 MED ORDER — ACETAMINOPHEN 650 MG RE SUPP: 650.0000 mg | Freq: Four times a day (QID) | RECTAL | Status: DC | PRN

## 2019-08-12 MED ORDER — SODIUM CHLORIDE 0.9 % IV SOLN
INTRAVENOUS | Status: DC | PRN
  Administered 2019-08-12: 15 ug/min via INTRAVENOUS

## 2019-08-12 MED ORDER — SODIUM CHLORIDE 0.9 % IV SOLN
2.0000 g | Freq: Two times a day (BID) | INTRAVENOUS | Status: AC
  Administered 2019-08-12: 2 g via INTRAVENOUS
  Filled 2019-08-12: qty 2

## 2019-08-12 MED ORDER — HYDROCODONE-ACETAMINOPHEN 5-325 MG PO TABS: 1.0000 | ORAL_TABLET | ORAL | Status: DC | PRN

## 2019-08-12 MED ORDER — ONDANSETRON HCL 4 MG/2ML IJ SOLN
INTRAMUSCULAR | Status: AC
  Filled 2019-08-12: qty 8

## 2019-08-12 MED ORDER — FENTANYL CITRATE (PF) 100 MCG/2ML IJ SOLN
INTRAMUSCULAR | Status: DC | PRN
  Administered 2019-08-12: 100 ug via INTRAVENOUS

## 2019-08-12 MED ORDER — ROCURONIUM BROMIDE 100 MG/10ML IV SOLN
INTRAVENOUS | Status: DC | PRN
  Administered 2019-08-12 (×2): 30 mg via INTRAVENOUS
  Administered 2019-08-12: 20 mg via INTRAVENOUS
  Administered 2019-08-12: 10 mg via INTRAVENOUS
  Administered 2019-08-12: 20 mg via INTRAVENOUS

## 2019-08-12 MED ORDER — SODIUM CHLORIDE 0.9 % IV SOLN
INTRAVENOUS | Status: DC
  Administered 2019-08-12 (×2): via INTRAVENOUS

## 2019-08-12 MED ORDER — PHENYLEPHRINE HCL (PRESSORS) 10 MG/ML IV SOLN
INTRAVENOUS | Status: DC | PRN
  Administered 2019-08-12 (×3): 100 ug via INTRAVENOUS
  Administered 2019-08-12: 200 ug via INTRAVENOUS
  Administered 2019-08-12: 100 ug via INTRAVENOUS

## 2019-08-12 MED ORDER — ALVIMOPAN 12 MG PO CAPS
12.0000 mg | ORAL_CAPSULE | Freq: Two times a day (BID) | ORAL | Status: DC
  Administered 2019-08-13 – 2019-08-15 (×5): 12 mg via ORAL
  Filled 2019-08-12 (×6): qty 1

## 2019-08-12 MED ORDER — LIDOCAINE HCL (PF) 2 % IJ SOLN
INTRAMUSCULAR | Status: AC
  Filled 2019-08-12: qty 30

## 2019-08-12 MED ORDER — EPINEPHRINE 1 MG/10ML IJ SOSY
PREFILLED_SYRINGE | INTRAMUSCULAR | Status: AC
  Filled 2019-08-12: qty 10

## 2019-08-12 MED ORDER — ACETAMINOPHEN 325 MG PO TABS: 650.0000 mg | ORAL_TABLET | Freq: Four times a day (QID) | ORAL | Status: DC | PRN

## 2019-08-12 MED ORDER — SODIUM CHLORIDE 0.9 % IV SOLN
INTRAVENOUS | Status: DC
  Administered 2019-08-12 – 2019-08-13 (×3): via INTRAVENOUS

## 2019-08-12 MED ORDER — LISINOPRIL 20 MG PO TABS
40.0000 mg | ORAL_TABLET | Freq: Every evening | ORAL | Status: DC
  Administered 2019-08-12 – 2019-08-14 (×3): 40 mg via ORAL
  Filled 2019-08-12 (×3): qty 2

## 2019-08-12 MED ORDER — MIDAZOLAM HCL 2 MG/2ML IJ SOLN
INTRAMUSCULAR | Status: AC
  Filled 2019-08-12: qty 2

## 2019-08-12 MED ORDER — ALVIMOPAN 12 MG PO CAPS
ORAL_CAPSULE | ORAL | Status: AC
  Administered 2019-08-12: 12 mg via ORAL
  Filled 2019-08-12: qty 1

## 2019-08-12 MED ORDER — BUPIVACAINE LIPOSOME 1.3 % IJ SUSP: 20.0000 mL | Freq: Once | INTRAMUSCULAR | Status: DC

## 2019-08-12 MED ORDER — FENTANYL CITRATE (PF) 100 MCG/2ML IJ SOLN: 25.0000 ug | INTRAMUSCULAR | Status: DC | PRN

## 2019-08-12 MED ORDER — AMITRIPTYLINE HCL 25 MG PO TABS
25.0000 mg | ORAL_TABLET | Freq: Every evening | ORAL | Status: DC
  Administered 2019-08-12 – 2019-08-14 (×3): 25 mg via ORAL
  Filled 2019-08-12 (×4): qty 1

## 2019-08-12 MED ORDER — DONEPEZIL HCL 5 MG PO TABS
5.0000 mg | ORAL_TABLET | Freq: Every day | ORAL | Status: DC
  Administered 2019-08-12 – 2019-08-14 (×3): 5 mg via ORAL
  Filled 2019-08-12 (×4): qty 1

## 2019-08-12 MED ORDER — EPHEDRINE SULFATE 50 MG/ML IJ SOLN
INTRAMUSCULAR | Status: AC
  Filled 2019-08-12: qty 1

## 2019-08-12 MED ORDER — PROPOFOL 10 MG/ML IV BOLUS
INTRAVENOUS | Status: AC
  Filled 2019-08-12: qty 20

## 2019-08-12 MED ORDER — PHENYLEPHRINE HCL (PRESSORS) 10 MG/ML IV SOLN
INTRAVENOUS | Status: AC
  Filled 2019-08-12: qty 1

## 2019-08-12 MED ORDER — ONDANSETRON HCL 4 MG/2ML IJ SOLN: 4.0000 mg | Freq: Once | INTRAMUSCULAR | Status: DC | PRN

## 2019-08-12 MED ORDER — CELECOXIB 200 MG PO CAPS
200.0000 mg | ORAL_CAPSULE | ORAL | Status: AC
  Administered 2019-08-12: 11:00:00 200 mg via ORAL

## 2019-08-12 MED ORDER — ONDANSETRON 4 MG PO TBDP: 4.0000 mg | ORAL_TABLET | Freq: Four times a day (QID) | ORAL | Status: DC | PRN

## 2019-08-12 MED ORDER — GLYCOPYRROLATE 0.2 MG/ML IJ SOLN
INTRAMUSCULAR | Status: DC | PRN
  Administered 2019-08-12: 0.2 mg via INTRAVENOUS

## 2019-08-12 MED ORDER — EPHEDRINE SULFATE 50 MG/ML IJ SOLN
INTRAMUSCULAR | Status: DC | PRN
  Administered 2019-08-12 (×3): 5 mg via INTRAVENOUS
  Administered 2019-08-12: 10 mg via INTRAVENOUS
  Administered 2019-08-12: 5 mg via INTRAVENOUS

## 2019-08-12 MED ORDER — SUCCINYLCHOLINE CHLORIDE 20 MG/ML IJ SOLN
INTRAMUSCULAR | Status: AC
  Filled 2019-08-12: qty 3

## 2019-08-12 MED ORDER — DEXAMETHASONE SODIUM PHOSPHATE 10 MG/ML IJ SOLN
INTRAMUSCULAR | Status: AC
  Filled 2019-08-12: qty 3

## 2019-08-12 MED ORDER — LABETALOL HCL 5 MG/ML IV SOLN
INTRAVENOUS | Status: AC
  Filled 2019-08-12: qty 4

## 2019-08-12 MED ORDER — MENTHOL 3 MG MT LOZG
1.0000 | LOZENGE | OROMUCOSAL | Status: DC | PRN
  Administered 2019-08-12: 3 mg via ORAL
  Filled 2019-08-12 (×2): qty 9

## 2019-08-12 MED ORDER — MORPHINE SULFATE (PF) 4 MG/ML IV SOLN: 4.0000 mg | INTRAVENOUS | Status: DC | PRN

## 2019-08-12 MED ORDER — ENOXAPARIN SODIUM 40 MG/0.4ML ~~LOC~~ SOLN
40.0000 mg | SUBCUTANEOUS | Status: DC
  Administered 2019-08-13 – 2019-08-15 (×3): 40 mg via SUBCUTANEOUS
  Filled 2019-08-12 (×3): qty 0.4

## 2019-08-12 MED ORDER — HYDROMORPHONE HCL 1 MG/ML IJ SOLN
INTRAMUSCULAR | Status: AC
  Filled 2019-08-12: qty 1

## 2019-08-12 MED ORDER — HYDROMORPHONE HCL 1 MG/ML IJ SOLN
INTRAMUSCULAR | Status: DC | PRN
  Administered 2019-08-12 (×2): 0.5 mg via INTRAVENOUS

## 2019-08-12 SURGICAL SUPPLY — 64 items
"PENCIL ELECTRO HAND CTR " (MISCELLANEOUS) ×1 IMPLANT
ADH SKN CLS APL DERMABOND .7 (GAUZE/BANDAGES/DRESSINGS)
BLADE CLIPPER SURG (BLADE) ×1 IMPLANT
BLADE SURG SZ10 CARB STEEL (BLADE) ×2 IMPLANT
BLADE SURG SZ11 CARB STEEL (BLADE) ×2 IMPLANT
CANISTER SUCT 1200ML W/VALVE (MISCELLANEOUS) ×2 IMPLANT
COVER WAND RF STERILE (DRAPES) ×2 IMPLANT
DECANTER SPIKE VIAL GLASS SM (MISCELLANEOUS) ×2 IMPLANT
DEFOGGER SCOPE WARMER CLEARIFY (MISCELLANEOUS) ×2 IMPLANT
DERMABOND ADVANCED (GAUZE/BANDAGES/DRESSINGS)
DERMABOND ADVANCED .7 DNX12 (GAUZE/BANDAGES/DRESSINGS) ×2 IMPLANT
ELECT CAUTERY BLADE 6.4 (BLADE) ×2 IMPLANT
ELECT REM PT RETURN 9FT ADLT (ELECTROSURGICAL) ×2
ELECTRODE REM PT RTRN 9FT ADLT (ELECTROSURGICAL) ×1 IMPLANT
GLOVE BIO SURGEON STRL SZ 6.5 (GLOVE) ×12 IMPLANT
GLOVE BIOGEL PI IND STRL 6.5 (GLOVE) ×2 IMPLANT
GLOVE BIOGEL PI INDICATOR 6.5 (GLOVE) ×8
GLOVE INDICATOR 6.5 STRL GRN (GLOVE) ×4 IMPLANT
GOWN STRL REUS W/ TWL LRG LVL3 (GOWN DISPOSABLE) ×4 IMPLANT
GOWN STRL REUS W/TWL LRG LVL3 (GOWN DISPOSABLE) ×16
HANDLE SUCTION POOLE (INSTRUMENTS) ×1 IMPLANT
HANDLE YANKAUER SUCT BULB TIP (MISCELLANEOUS) ×2 IMPLANT
HOLDER FOLEY CATH W/STRAP (MISCELLANEOUS) ×2 IMPLANT
IRRIGATION STRYKERFLOW (MISCELLANEOUS) ×1 IMPLANT
IRRIGATOR STRYKERFLOW (MISCELLANEOUS) ×2
IV NS 1000ML (IV SOLUTION) ×2
IV NS 1000ML BAXH (IV SOLUTION) ×1 IMPLANT
NEEDLE HYPO 22GX1.5 SAFETY (NEEDLE) ×2 IMPLANT
NS IRRIG 1000ML POUR BTL (IV SOLUTION) ×2 IMPLANT
PACK COLON CLEAN CLOSURE (MISCELLANEOUS) ×2 IMPLANT
PACK LAP CHOLECYSTECTOMY (MISCELLANEOUS) ×2 IMPLANT
PENCIL ELECTRO HAND CTR (MISCELLANEOUS) ×2 IMPLANT
RELOAD PROXIMATE 75MM BLUE (ENDOMECHANICALS) ×6 IMPLANT
RELOAD PROXIMATE TA60MM BLUE (ENDOMECHANICALS) IMPLANT
RELOAD STAPLE 60 BLU REG PROX (ENDOMECHANICALS) IMPLANT
RELOAD STAPLE 75 3.8 BLU REG (ENDOMECHANICALS) IMPLANT
RETRACTOR WOUND ALXS 18CM MED (MISCELLANEOUS) ×1 IMPLANT
RTRCTR WOUND ALEXIS O 18CM MED (MISCELLANEOUS) ×2
SCISSORS METZENBAUM CVD 33 (INSTRUMENTS) IMPLANT
SHEARS HARMONIC ACE PLUS 36CM (ENDOMECHANICALS) ×1 IMPLANT
SLEEVE ENDOPATH XCEL 5M (ENDOMECHANICALS) ×2 IMPLANT
SPONGE LAP 18X18 RF (DISPOSABLE) ×4 IMPLANT
STAPLER GUN LINEAR PROX 60 (STAPLE) IMPLANT
STAPLER PROXIMATE 75MM BLUE (STAPLE) ×1 IMPLANT
SUCTION POOLE HANDLE (INSTRUMENTS) ×2
SURGILUBE 2OZ TUBE FLIPTOP (MISCELLANEOUS) ×2 IMPLANT
SUT MNCRL 4-0 (SUTURE) ×4
SUT MNCRL 4-0 27XMFL (SUTURE) ×2
SUT PDS AB 1 TP1 96 (SUTURE) ×3 IMPLANT
SUT SILK 2 0 (SUTURE) ×4
SUT SILK 2 0 SH CR/8 (SUTURE) ×2 IMPLANT
SUT SILK 2-0 18XBRD TIE 12 (SUTURE) IMPLANT
SUT SILK 2-0 30XBRD TIE 12 (SUTURE) ×1 IMPLANT
SUT VIC AB 3-0 SH 27 (SUTURE) ×2
SUT VIC AB 3-0 SH 27X BRD (SUTURE) IMPLANT
SUT VICRYL 3-0 CR8 SH (SUTURE) ×2 IMPLANT
SUTURE MNCRL 4-0 27XMF (SUTURE) ×2 IMPLANT
SYR 30ML LL (SYRINGE) ×2 IMPLANT
SYR BULB IRRIG 60ML STRL (SYRINGE) ×1 IMPLANT
SYS LAPSCP GELPORT 120MM (MISCELLANEOUS) ×2
SYSTEM LAPSCP GELPORT 120MM (MISCELLANEOUS) ×1 IMPLANT
TRAY FOLEY SLVR 16FR LF STAT (SET/KITS/TRAYS/PACK) ×2 IMPLANT
TROCAR XCEL NON-BLD 5MMX100MML (ENDOMECHANICALS) ×2 IMPLANT
TUBING EVAC SMOKE HEATED PNEUM (TUBING) ×2 IMPLANT

## 2019-08-12 NOTE — Op Note (Signed)
Preoperative diagnosis: Carcinoma of ascending colon  Postoperative diagnosis: Carcinoma of ascending colon  Procedure: Laparoscopic (hand assisted) right hemicolectomy with primary anastomosis.   Anesthesia: GETA  Surgeon: Dr. Windell Moment, MD  Assistant Surgeon: Dr. Lysle Pearl  Wound Classification: Clean Contaminated  Indications: Patient is a 78 y.o. male who was found to have carcinoma involving the ascending colon. Resection was indicated for oncologic treatment.   Findings: 1. Palpable mass at the cecum 2. Tension free anastomosis 3. Adequate gross circulation on anastomosis 4. No gross metastatic disease identified 5. Adequate hemostasis  Details of operation: The patient was brought to the operating room and placed on the operating table in the supine position. The abdomen was clipped, prepped with 2% chlorhexidine solution, and draped in the standard fashion. A time-out was completed verifying correct patient, procedure, site, positioning, and implant(s) and/or special equipment prior to beginning this procedure. A vertical 5 cm periumbilical incision was used to access the peritoneal cavity and a Gelport was placed.  Under direct visualization a 80mm left upper quadrant trocar was introduced and pneumoperitoneum was achieved. Another 5 mm trocar was introduced on the epigastric area. The patient was then positioned in 30 reverse Trendelenburg position with the patient's right side up to allow for small bowel to drop clear of the operative field. The 5-mm scope was introduced from the epigastric port at the initial part of the procedure. With a hand assisted technique, the cecum was grasped with the left hand of the operating surgeon and lifted up to tent the mesentery. The dissection was then started at the proximal transverse colon and was followed to the hepatic flexure and proximally through the ascending colon line of Toldt.  The hepatic flexure more was mobilized down until the  duodenum was identified. The Duodenum and its attachments were swept downward. The C loop of the duodenum as well as a head of the pancreas were fully visualized with dissected and preserved. This dissection was continued in the avascular plane until the liver or gallbladder were encountered.  The lateral attachments of the ascending colon were then divided including mobilization of the retroperitoneal attachments of the terminal ileum.  The Omentum attached to resected portions of colon was divided and resected with the specimen.  A grasper was placed on the ascending colon and then delivered and extracted through the Gelport incision. The small bowel and colon were then divided extracorporeally using a GIA device. The mesenteric dissection was completed from the small bowel to the colonic side with harmonic device and the specimen resection was thus completed. The specimen was removed. The two ends of the ileum and transverse colon were then aligned, ensuring that the mesentery was not twisted, and a stapled end-to-end anastomosis was created in the usual manner. The mesenteric defect was then closed with Vicryl 3-0 sutures and the bowel reintroduced into the peritoneal cavity.  The abdominal cavity was again insufflated for final inspection.  No bleeding was identified.  Anastomosis was seen relaxed on the right upper quadrant. The surgeon, the assistant and the surgical tech change the gown and gloves for clean closure.  The fascia was then closed with PDS 1. The skin was closed with 4-0 Monocryl sutures in a running fashion and a dry sterile dressing was applied. The sponge and instrument count was correct, blood loss was minimal, and there were no complications. The patient tolerated the procedure well and was transferred to the post-anesthesia care unit in stable condition.   Specimen: Right Colon and anastomosis  Complications: None  Estimated Blood Loss: 50 mL

## 2019-08-12 NOTE — Anesthesia Postprocedure Evaluation (Signed)
Anesthesia Post Note  Patient: Daniel Sherman  Procedure(s) Performed: LAPAROSCOPIC HAND ASSISTED RIGHT COLECTOMY (Right Abdomen)  Patient location during evaluation: PACU Anesthesia Type: General Level of consciousness: awake and alert Pain management: pain level controlled Vital Signs Assessment: post-procedure vital signs reviewed and stable Respiratory status: spontaneous breathing, nonlabored ventilation, respiratory function stable and patient connected to nasal cannula oxygen Cardiovascular status: blood pressure returned to baseline and stable Postop Assessment: no apparent nausea or vomiting Anesthetic complications: no     Last Vitals:  Vitals:   08/12/19 1738 08/12/19 2026  BP: 124/60 111/61  Pulse: 97 (!) 102  Resp: 20 20  Temp: 37.4 C (!) 36.3 C  SpO2: 99% 96%    Last Pain:  Vitals:   08/12/19 2026  TempSrc: Oral  PainSc:                  Martha Clan

## 2019-08-12 NOTE — Anesthesia Preprocedure Evaluation (Addendum)
Anesthesia Evaluation  Patient identified by MRN, date of birth, ID band Patient awake    Reviewed: Allergy & Precautions, NPO status , Patient's Chart, lab work & pertinent test results, reviewed documented beta blocker date and time   History of Anesthesia Complications (+) PONV and history of anesthetic complications  Airway Mallampati: II  TM Distance: >3 FB     Dental  (+) Chipped, Upper Dentures, Lower Dentures   Pulmonary           Cardiovascular hypertension, Pt. on medications and Pt. on home beta blockers + CAD and + Past MI       Neuro/Psych  Headaches,    GI/Hepatic GERD  Controlled,(+) Hepatitis -  Endo/Other  diabetes, Type 2  Renal/GU      Musculoskeletal  (+) Arthritis ,   Abdominal   Peds  Hematology   Anesthesia Other Findings   Reproductive/Obstetrics                            Anesthesia Physical Anesthesia Plan  ASA: II  Anesthesia Plan: General   Post-op Pain Management:    Induction: Intravenous  PONV Risk Score and Plan:   Airway Management Planned: Oral ETT  Additional Equipment:   Intra-op Plan:   Post-operative Plan:   Informed Consent: I have reviewed the patients History and Physical, chart, labs and discussed the procedure including the risks, benefits and alternatives for the proposed anesthesia with the patient or authorized representative who has indicated his/her understanding and acceptance.       Plan Discussed with: CRNA  Anesthesia Plan Comments:         Anesthesia Quick Evaluation

## 2019-08-12 NOTE — Anesthesia Post-op Follow-up Note (Signed)
Anesthesia QCDR form completed.        

## 2019-08-12 NOTE — Progress Notes (Signed)
Per MD, patients wife is allowed to stay the night with patient due to memory impairment.

## 2019-08-12 NOTE — Anesthesia Procedure Notes (Signed)
Procedure Name: Intubation Date/Time: 08/12/2019 12:21 PM Performed by: Jonna Clark, CRNA Pre-anesthesia Checklist: Patient identified, Patient being monitored, Timeout performed, Emergency Drugs available and Suction available Patient Re-evaluated:Patient Re-evaluated prior to induction Oxygen Delivery Method: Circle system utilized Preoxygenation: Pre-oxygenation with 100% oxygen Induction Type: IV induction Ventilation: Mask ventilation without difficulty Laryngoscope Size: Mac and 3 Grade View: Grade I Tube type: Oral Tube size: 7.5 mm Number of attempts: 1 Placement Confirmation: ETT inserted through vocal cords under direct vision,  positive ETCO2 and breath sounds checked- equal and bilateral Secured at: 21 cm Tube secured with: Tape Dental Injury: Teeth and Oropharynx as per pre-operative assessment

## 2019-08-12 NOTE — Interval H&P Note (Signed)
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  The various methods of treatment have been discussed with the patient and family. After consideration of risks, benefits and other options for treatment, the patient has consented to  Procedure(s): LAPAROSCOPIC HAND ASSISTED RIGHT COLECTOMY, DIABETIC (Right) as a surgical intervention.  The patient's history has been reviewed, patient examined, no change in status, stable for surgery.  I have reviewed the patient's chart and labs.  Questions were answered to the patient's satisfaction.     Herbert Pun

## 2019-08-12 NOTE — Transfer of Care (Signed)
Immediate Anesthesia Transfer of Care Note  Patient: Daniel Sherman  Procedure(s) Performed: LAPAROSCOPIC HAND ASSISTED RIGHT COLECTOMY (Right Abdomen)  Patient Location: PACU  Anesthesia Type:General  Level of Consciousness: sedated and responds to stimulation  Airway & Oxygen Therapy: Patient Spontanous Breathing and Patient connected to face mask oxygen  Post-op Assessment: Report given to RN and Post -op Vital signs reviewed and stable  Post vital signs: Reviewed and stable  Last Vitals:  Vitals Value Taken Time  BP 113/60 08/12/19 1557  Temp 36.5 C 08/12/19 1557  Pulse 92 08/12/19 1601  Resp 16 08/12/19 1601  SpO2 96 % 08/12/19 1601  Vitals shown include unvalidated device data.  Last Pain:  Vitals:   08/12/19 1039  TempSrc: Oral         Complications: No apparent anesthesia complications

## 2019-08-13 LAB — BASIC METABOLIC PANEL
Anion gap: 10 (ref 5–15)
BUN: 25 mg/dL — ABNORMAL HIGH (ref 8–23)
CO2: 21 mmol/L — ABNORMAL LOW (ref 22–32)
Calcium: 7.6 mg/dL — ABNORMAL LOW (ref 8.9–10.3)
Chloride: 104 mmol/L (ref 98–111)
Creatinine, Ser: 1.66 mg/dL — ABNORMAL HIGH (ref 0.61–1.24)
GFR calc Af Amer: 45 mL/min — ABNORMAL LOW (ref >60)
GFR calc non Af Amer: 39 mL/min — ABNORMAL LOW (ref >60)
Glucose, Bld: 160 mg/dL — ABNORMAL HIGH (ref 70–99)
Potassium: 4.3 mmol/L (ref 3.5–5.1)
Sodium: 135 mmol/L (ref 135–145)

## 2019-08-13 LAB — CBC
HCT: 35.4 % — ABNORMAL LOW (ref 39.0–52.0)
Hemoglobin: 11.8 g/dL — ABNORMAL LOW (ref 13.0–17.0)
MCH: 26.6 pg (ref 26.0–34.0)
MCHC: 33.3 g/dL (ref 30.0–36.0)
MCV: 79.9 fL — ABNORMAL LOW (ref 80.0–100.0)
Platelets: 92 10*3/uL — ABNORMAL LOW (ref 150–400)
RBC: 4.43 MIL/uL (ref 4.22–5.81)
RDW: 13.4 % (ref 11.5–15.5)
WBC: 6 10*3/uL (ref 4.0–10.5)
nRBC: 0 % (ref 0.0–0.2)

## 2019-08-13 LAB — GLUCOSE, CAPILLARY
Glucose-Capillary: 120 mg/dL — ABNORMAL HIGH (ref 70–99)
Glucose-Capillary: 136 mg/dL — ABNORMAL HIGH (ref 70–99)
Glucose-Capillary: 94 mg/dL (ref 70–99)
Glucose-Capillary: 131 mg/dL — ABNORMAL HIGH (ref 70–99)

## 2019-08-13 NOTE — Progress Notes (Signed)
[facility] Day(s): 1.   Post op day(s): 1 Day Post-Op.   Interval History: Patient seen and examined, no acute events or new complaints overnight. Patient reports feeling good today.  He denies any abdominal pain today.  He denies passing gas.  Reports that he is recovering well.  Adequate urine output.  Vital signs in last 24 hours: [min-max] current  Temp:  [97.3 F (36.3 C)-99.4 F (Daniel.4 C)] 97.8 F (36.6 C) (09/10 0516) Pulse Rate:  [86-103] 98 (09/10 0516) Resp:  [16-20] 20 (09/10 0516) BP: (111-133)/(57-65) 121/59 (09/10 0516) SpO2:  [94 %-99 %] 96 % (09/10 0516) Weight:  [92.5 kg] 92.5 kg (09/09 1039)     Height: 5\' 6"  (167.6 cm) Weight: 92.5 kg BMI (Calculated): 32.93   Urine 1340 mL since surgery  Physical Exam:  Constitutional: alert, cooperative and no distress  Respiratory: breathing non-labored at rest  Cardiovascular: regular rate and sinus rhythm  Gastrointestinal: soft, non-tender, and non-distended.  Wounds dry and clean.  Labs:  CBC Latest Ref Rng & Units 08/13/2019 12/11/2016 12/10/2016  WBC 4.0 - 10.5 K/uL 6.0 2.0(L) 4.4  Hemoglobin 13.0 - 17.0 g/dL 11.8(L) 10.0(L) 11.1(L)  Hematocrit 39.0 - 52.0 % 35.4(L) 29.9(L) 33.2(L)  Platelets 150 - 400 K/uL 92(L) 50(L) 59(L)   CMP Latest Ref Rng & Units 08/13/2019 12/11/2016 12/10/2016  Glucose 70 - 99 mg/dL 160(H) 107(H) 125(H)  BUN 8 - 23 mg/dL 25(H) 21(H) 23(H)  Creatinine 0.61 - 1.24 mg/dL 1.66(H) 1.31(H) 1.42(H)  Sodium 135 - 145 mmol/L 135 135 137  Potassium 3.5 - 5.1 mmol/L 4.3 3.4(L) 4.0  Chloride 98 - 111 mmol/L 104 104 106  CO2 22 - 32 mmol/L 21(L) 23 25  Calcium 8.9 - 10.3 mg/dL 7.6(L) 7.2(L) 7.6(L)  Total Protein 6.5 - 8.1 g/dL - 5.5(L) 5.7(L)  Total Bilirubin 0.3 - 1.2 mg/dL - 1.5(H) 2.5(H)  Alkaline Phos 38 - 126 U/L - 70 87  AST 15 - 41 U/L - 96(H) 276(H)  ALT 17 - 63 U/L - 209(H) 365(H)    Imaging studies: No new pertinent imaging studies   Assessment/Plan:  78 y.o. Sherman  with ascending colon cancer 1 Day Post-Op s/p laparoscopic right hemicolectomy, complicated by pertinent comorbidities including coronary artery disease, hypertension, diabetes. Patient recovering properly.  There is adequate urine output.  There is no white blood cell count, stable hemoglobin.  Platelets are stable.  Creatinine 1.6 coming from 1.5 from preop labs.  Will supplement with.  There has been no sign of bleeding.  Pain is properly controlled.  I will continue with clear liquid since patient has not passed any gas.  I will discontinue the Foley catheter and encouraged the patient to ambulate.  Encourage patient to perform incentive spirometer.  We will continue with DVT prophylaxis.   Arnold Long, MD

## 2019-08-13 NOTE — Evaluation (Signed)
Physical Therapy Evaluation Patient Details Name: Daniel Sherman MRN: [id number] DOB: Nov 28, 1941 Today's Date: 08/13/2019   History of Present Illness  pt is a 78 yo male s/p laparoscopic hand assisted right colectomy, PMH includes CAD, colon polyp, DM, MI, HTN  Clinical Impression  Pt is a 78 yo male admitted for above. Pt in recliner upon arrival and agreed to participate with PT. Pt presents with mild strength and balance deficits and pain around operative site. Pt reports ambulating about 2 miles everyday with his wife with no AD. Pt ambulated around nurses station with RW after ambulating in room with extremely slow gait pattern, mild unsteadiness and grabbing onto furniture without an AD. Pt ambulated with improved gait speed, pattern and safety with RW. Pt would benefit from skilled acute therapy to improve deficits and for higher level balance assessment/challenges and improve activity tolerance to allow for full return to PLOF. Pt would benefit from ambulating with RW in the short term due to improved stability with RW. Pt educated on recommendation. Pt reports not having any concerns about returning home and that is wife is always with him and able to help and he has a son that only lives a few miles away and can help.     Follow Up Recommendations No PT follow up    Equipment Recommendations  Rolling walker with 5" wheels    Recommendations for Other Services       Precautions / Restrictions Precautions Precautions: Fall Restrictions Weight Bearing Restrictions: No      Mobility  Bed Mobility               General bed mobility comments: pt in recliner upon arrival stating minimal difficulty getting up with nursing secondary to first time getting up since surgery  Transfers Overall transfer level: Needs assistance Equipment used: None Transfers: Sit to/from Stand Sit to Stand: Min guard         General transfer comment: pt required cuing for hand placement,  increased time to rise but no physical assist required, pt steady  Ambulation/Gait Ambulation/Gait assistance: Min guard Gait Distance (Feet): 180 Feet Assistive device: Rolling walker (2 wheeled) Gait Pattern/deviations: Step-through pattern;Decreased stride length;Trunk flexed Gait velocity: decreased   General Gait Details: pt ambulated in room with no AD with extremely slow and cautious gait pattern with mild unsteadiness noted and pt reaching out to grab onto furniture, pt given RW and ambulated around nurses station with improved gait pattern and stability compared to without RW  Stairs            Wheelchair Mobility    Modified Rankin (Stroke Patients Only)       Balance Overall balance assessment: Needs assistance Sitting-balance support: Single extremity supported Sitting balance-Leahy Scale: Good       Standing balance-Leahy Scale: Fair                 High Level Balance Comments: pt able to maintain balance with no UE support and EO in narrow base of support for 30 sec with minimal increased sway, pt able to maintain tandem stance with no UE support and EO for ~15 sec with increased sway and pt reported increased difficulty             Pertinent Vitals/Pain Pain Assessment: 0-10 Pain Score: 2  Pain Location: sore Pain Descriptors / Indicators: Operative site guarding;Sore Pain Intervention(s): Limited activity within patient's tolerance;Monitored during session;Repositioned    Home Living Family/patient expects to be discharged  to:: Private residence Living Arrangements: Spouse/significant other Available Help at Discharge: Family;Available 24 hours/day Type of Home: House Home Access: Level entry     Home Layout: One level        Prior Function Level of Independence: Independent         Comments: pt reports independent with all ADLs/IADLs, pt denies any falls in the last year and reports walking about 2 miles a day with his wife in  their neighborhood and at the mall, pt reported he does have some trouble with his memory at times     Hand Dominance        Extremity/Trunk Assessment   Upper Extremity Assessment Upper Extremity Assessment: Overall WFL for tasks assessed    Lower Extremity Assessment Lower Extremity Assessment: Generalized weakness    Cervical / Trunk Assessment Cervical / Trunk Assessment: Kyphotic  Communication   Communication: No difficulties  Cognition Arousal/Alertness: Awake/alert Behavior During Therapy: WFL for tasks assessed/performed Overall Cognitive Status: Within Functional Limits for tasks assessed                                        General Comments      Exercises Total Joint Exercises Hip ABduction/ADduction: AROM;Strengthening;Both;5 reps;Standing Standing Hip Extension: AROM;Strengthening;Both;5 reps;Standing General Exercises - Lower Extremity Toe Raises: AROM;Strengthening;Both;10 reps;Standing Heel Raises: AROM;Strengthening;Both;10 reps;Standing Other Exercises Other Exercises: 5x STS in 33 seconds, pt slow but steady, pt did not require use of UEs on last two reps   Assessment/Plan    PT Assessment Patient needs continued PT services  PT Problem List Decreased strength;Decreased activity tolerance;Decreased balance;Decreased mobility       PT Treatment Interventions DME instruction;Therapeutic exercise;Gait training;Balance training;Stair training;Neuromuscular re-education;Functional mobility training;Therapeutic activities;Patient/family education    PT Goals (Current goals can be found in the Care Plan section)  Acute Rehab PT Goals Patient Stated Goal: return home and to normal activities PT Goal Formulation: With patient Time For Goal Achievement: 08/27/19 Potential to Achieve Goals: Good    Frequency Min 2X/week   Barriers to discharge        Co-evaluation               AM-PAC PT "6 Clicks" Mobility  Outcome  Measure Help needed turning from your back to your side while in a flat bed without using bedrails?: A Little Help needed moving from lying on your back to sitting on the side of a flat bed without using bedrails?: A Little Help needed moving to and from a bed to a chair (including a wheelchair)?: A Little Help needed standing up from a chair using your arms (e.g., wheelchair or bedside chair)?: A Little Help needed to walk in hospital room?: A Little Help needed climbing 3-5 steps with a railing? : A Little 6 Click Score: 18    End of Session Equipment Utilized During Treatment: Gait belt Activity Tolerance: Patient tolerated treatment well Patient left: in chair;with call bell/phone within reach Nurse Communication: Mobility status PT Visit Diagnosis: Other abnormalities of gait and mobility (R26.89);Unsteadiness on feet (R26.81)    Time: [id number] PT Time Calculation (min) (ACUTE ONLY): 26 min   Charges:   PT Evaluation $PT Eval Low Complexity: 1 Low PT Treatments $Therapeutic Exercise: 8-22 mins       Dewel Lotter PT, DPT 12:42 PM,08/13/19 [phone]

## 2019-08-13 NOTE — Progress Notes (Signed)
Removed foley catheter per MD order.

## 2019-08-13 NOTE — Progress Notes (Signed)
Patient verbalized he doesn't wish to get a blood transfusion if ever is needed.MD notified. Sticky note placed in patient chart.

## 2019-08-14 LAB — GLUCOSE, CAPILLARY
Glucose-Capillary: 110 mg/dL — ABNORMAL HIGH (ref 70–99)
Glucose-Capillary: 128 mg/dL — ABNORMAL HIGH (ref 70–99)
Glucose-Capillary: 110 mg/dL — ABNORMAL HIGH (ref 70–99)
Glucose-Capillary: 121 mg/dL — ABNORMAL HIGH (ref 70–99)

## 2019-08-14 LAB — CBC
HCT: 34.3 % — ABNORMAL LOW (ref 39.0–52.0)
Hemoglobin: 11.1 g/dL — ABNORMAL LOW (ref 13.0–17.0)
MCH: 26.4 pg (ref 26.0–34.0)
MCHC: 32.4 g/dL (ref 30.0–36.0)
MCV: 81.5 fL (ref 80.0–100.0)
Platelets: 79 10*3/uL — ABNORMAL LOW (ref 150–400)
RBC: 4.21 MIL/uL — ABNORMAL LOW (ref 4.22–5.81)
RDW: 13.4 % (ref 11.5–15.5)
WBC: 3.9 10*3/uL — ABNORMAL LOW (ref 4.0–10.5)
nRBC: 0 % (ref 0.0–0.2)

## 2019-08-14 LAB — BASIC METABOLIC PANEL
Anion gap: 5 (ref 5–15)
BUN: 19 mg/dL (ref 8–23)
CO2: 25 mmol/L (ref 22–32)
Calcium: 7.9 mg/dL — ABNORMAL LOW (ref 8.9–10.3)
Chloride: 109 mmol/L (ref 98–111)
Creatinine, Ser: 1.32 mg/dL — ABNORMAL HIGH (ref 0.61–1.24)
GFR calc Af Amer: 59 mL/min — ABNORMAL LOW (ref >60)
GFR calc non Af Amer: 51 mL/min — ABNORMAL LOW (ref >60)
Glucose, Bld: 126 mg/dL — ABNORMAL HIGH (ref 70–99)
Potassium: 4.4 mmol/L (ref 3.5–5.1)
Sodium: 139 mmol/L (ref 135–145)

## 2019-08-14 LAB — HEMOGLOBIN A1C
Hgb A1c MFr Bld: 6.1 % — ABNORMAL HIGH (ref 4.8–5.6)
Mean Plasma Glucose: 128 mg/dL

## 2019-08-14 MED ORDER — MINERAL OIL PO OIL
30.0000 mL | TOPICAL_OIL | Freq: Once | ORAL | Status: AC
  Administered 2019-08-14: 11:00:00 30 mL via ORAL
  Filled 2019-08-14: qty 30

## 2019-08-14 NOTE — Plan of Care (Signed)
  Problem: Activity: Goal: Risk for activity intolerance will decrease Outcome: Progressing  Pt up to bathroom with standby assistance. Tolerated well

## 2019-08-14 NOTE — Progress Notes (Signed)
[facility] Day(s): 2.   Post op day(s): 2 Days Post-Op.   Interval History: Patient seen and examined, no acute events or new complaints overnight. Patient reports feeling well today.  He reported the pain is controlled.  He reports that he has been tolerating clear liquids.  He denies nausea or vomiting, denies.  Vital signs in last 24 hours: [min-max] current  Temp:  [97.4 F (36.3 C)-98.4 F (36.9 C)] 97.7 F (36.5 C) (09/11 0900) Pulse Rate:  [61-93] 85 (09/11 0900) Resp:  [18-20] 18 (09/11 0900) BP: (103-125)/(50-86) 115/57 (09/11 0900) SpO2:  [95 %-99 %] 99 % (09/11 0900)     Height: 5\' 6"  (167.6 cm) Weight: 92.5 kg BMI (Calculated): 32.93   Physical Exam:  Constitutional: alert, cooperative and no distress  Respiratory: breathing non-labored at rest  Cardiovascular: regular rate and sinus rhythm  Gastrointestinal: soft, non-tender, and non-distended. Wounds are dry and clean.   Labs:  CBC Latest Ref Rng & Units 08/14/2019 08/13/2019 12/11/2016  WBC 4.0 - 10.5 K/uL 3.9(L) 6.0 2.0(L)  Hemoglobin 13.0 - 17.0 g/dL 11.1(L) 11.8(L) 10.0(L)  Hematocrit 39.0 - 52.0 % 34.3(L) 35.4(L) 29.9(L)  Platelets 150 - 400 K/uL 79(L) 92(L) 50(L)   CMP Latest Ref Rng & Units 08/14/2019 08/13/2019 12/11/2016  Glucose 70 - 99 mg/dL 126(H) 160(H) 107(H)  BUN 8 - 23 mg/dL 19 25(H) 21(H)  Creatinine 0.61 - 1.24 mg/dL 1.32(H) 1.66(H) 1.31(H)  Sodium 135 - 145 mmol/L 139 135 135  Potassium 3.5 - 5.1 mmol/L 4.4 4.3 3.4(L)  Chloride 98 - 111 mmol/L 109 104 104  CO2 22 - 32 mmol/L 25 21(L) 23  Calcium 8.9 - 10.3 mg/dL 7.9(L) 7.6(L) 7.2(L)  Total Protein 6.5 - 8.1 g/dL - - 5.5(L)  Total Bilirubin 0.3 - 1.2 mg/dL - - 1.5(H)  Alkaline Phos 38 - 126 U/L - - 70  AST 15 - 41 U/L - - 96(H)  ALT 17 - 63 U/L - - 209(H)    Imaging studies: No new pertinent imaging studies   Assessment/Plan:  78 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 Patient recovering slowly.  Pain continues to be controlled.  He is tolerating clear liquids.  He sounds passing gas.  We will advance diet to full liquids and give mineral oil.  Hemoglobin is stable.  Monocyte count and platelets within normal limits.  There is some improvement in the creatinine which is at baseline.  There is no significant electrolyte disturbance.  Patient encouraged to ambulate and perform incentive spirometer.  We will continue with DVT prophylaxis.  Arnold Long, MD

## 2019-08-14 NOTE — Care Management Important Message (Signed)
Important Message  Patient Details  Name: Daniel Sherman MRN: [id number] Date of Birth: [birth year]   Medicare Important Message Given:  Yes  Initial Medicare IM given by Patient Access Associate on 08/13/2019 at 1:49pm.  Still valid.   Dannette Barbara 08/14/2019, 8:38 AM

## 2019-08-15 LAB — GLUCOSE, CAPILLARY: Glucose-Capillary: 117 mg/dL — ABNORMAL HIGH (ref 70–99)

## 2019-08-15 MED ORDER — HYDROCODONE-ACETAMINOPHEN 5-325 MG PO TABS: 1.0000 | ORAL_TABLET | ORAL | 0 refills | Status: AC | PRN

## 2019-08-15 NOTE — Discharge Summary (Signed)
  Patient ID: Daniel Sherman MRN: [id number] DOB/AGE: 1941-05-14 78 y.o.  Admit date: 08/12/2019 Discharge date: 08/15/2019   Discharge Diagnoses:  Active Problems:   Colon cancer [facility])   Procedures: Laparoscopic hand-assisted right hemicolectomy  Hospital Course: Patient with ascending colon cancer.  He underwent laparoscopic hand-assisted right colectomy.  He tolerated the procedure well.  He has been recovering properly.  He is ambulating, passing gas, pain control, voiding spontaneously and tolerating soft diet.  He has been stable with adequate vital signs.  Hemoglobin has been stable.  Renal function has been stable.  Wounds are dry and clean.  Patient very comfortable.  Physical Exam  Constitutional: He is oriented to person, place, and time and well-developed, well-nourished, and in no distress.  Cardiovascular: Normal rate and regular rhythm.  Pulmonary/Chest: Effort normal.  Abdominal: Soft. He exhibits no distension. There is no abdominal tenderness.  Neurological: He is alert and oriented to person, place, and time.  Skin: Skin is warm.     Consults: None  Disposition: Discharge disposition: 01-Home or Self Care       Discharge Instructions    Diet - low sodium heart healthy   Complete by: As directed      Allergies as of 08/15/2019      Reactions   Other    BLOOD PRODUCT REFUSAL      Medication List    TAKE these medications   amitriptyline 25 MG tablet Commonly known as: ELAVIL Take 25 mg by mouth every evening.   aspirin 81 MG tablet Take 40.5 mg by mouth daily.   carboxymethylcellulose 0.5 % Soln Commonly known as: REFRESH PLUS Place 1 drop into both eyes 2 (two) times daily as needed (dry eyes).   docusate sodium 100 MG capsule Commonly known as: COLACE Take 1 capsule (100 mg total) by mouth 2 (two) times daily as needed.   donepezil 5 MG tablet Commonly known as: ARICEPT Take 5 mg by mouth at bedtime.   HUMULIN 70/30 [place] Inject 10 Units  into the skin 2 (two) times daily.   hydrochlorothiazide 12.5 MG tablet Commonly known as: HYDRODIURIL Take 12.5 mg by mouth every evening.   HYDROcodone-acetaminophen 5-325 MG tablet Commonly known as: Norco Take 1 tablet by mouth every 4 (four) hours as needed for up to 3 days for moderate pain.   lisinopril 40 MG tablet Commonly known as: ZESTRIL Take 40 mg by mouth every evening.   metFORMIN 1000 MG tablet Commonly known as: GLUCOPHAGE Take 1,000 mg by mouth 2 (two) times daily.   omeprazole 40 MG capsule Commonly known as: PRILOSEC Take 40 mg by mouth daily.   simvastatin 40 MG tablet Commonly known as: ZOCOR Take 40 mg by mouth every evening.   VITAMIN B-12 IJ Inject 1,000 mcg as directed every 30 (thirty) days.      Follow-up Information    Herbert Pun, MD Follow up in 2 week(s).   Specialty: General Surgery Contact information: [address] [place] [address] [phone]

## 2019-08-15 NOTE — Discharge Instructions (Signed)
°  Diet: Resume soft low fiber diet for 2 weeks. After two weeks, start regular high fiber diet. It is preferred to have frequent small meals than having 3 big meals a day. Drink at least 8 glasses of water daily.   Activity: No heavy lifting >10 pounds (children, pets, laundry, garbage) or strenuous activity for the next 6 weeks, but light activity and walking are encouraged.   Do not drive for four weeks.  It is normal to feel tire most of the time since your body is using energy to heal.    Wound care: May shower with soapy water and pat dry (do not rub incisions), but no baths or submerging incision underwater until follow-up. (no swimming)   Do not smoke, since smoking delays the process of healing, among other negative effects.   Call the office if the wound becomes red and start to drain pus.   Medications: Resume all home medications. For mild to moderate pain: acetaminophen (Tylenol) or ibuprofen (if no kidney disease). Combining Tylenol with alcohol can substantially increase your risk of causing liver disease. Narcotic pain medications, if prescribed, can be used for severe pain, though may cause nausea, constipation, and drowsiness. Do not combine Tylenol and Percocet within a 6 hour period as Percocet contains Tylenol. If you do not need the narcotic pain medication, you do not need to fill the prescription. Do not drink alcohol while taking narcotics.   Call office ([phone]) at any time if any questions, worsening pain, fevers/chills, bleeding, drainage from incision site, or other concerns.  

## 2019-08-20 LAB — SURGICAL PATHOLOGY

## 2019-08-25 ENCOUNTER — Encounter: Payer: Self-pay | Admitting: Oncology

## 2019-08-25 ENCOUNTER — Other Ambulatory Visit: Payer: Self-pay

## 2019-08-25 NOTE — Progress Notes (Signed)
Patient pre screened for office appointment, no questions or concerns today. Patient was recently hospitalized for colon surgery.

## 2019-08-26 ENCOUNTER — Inpatient Hospital Stay: Payer: Medicare PPO | Attending: Oncology | Admitting: Oncology

## 2019-08-26 ENCOUNTER — Other Ambulatory Visit: Payer: Self-pay

## 2019-08-26 ENCOUNTER — Inpatient Hospital Stay: Admit: 2019-08-26 | Payer: Medicare PPO | Admitting: General Surgery

## 2019-08-26 VITALS — BP 136/75 | HR 92 | Temp 98.2°F | Resp 18 | Wt 189.5 lb

## 2019-08-26 DIAGNOSIS — E119 Type 2 diabetes mellitus without complications: Secondary | ICD-10-CM | POA: Diagnosis not present

## 2019-08-26 DIAGNOSIS — I10 Essential (primary) hypertension: Secondary | ICD-10-CM | POA: Insufficient documentation

## 2019-08-26 DIAGNOSIS — C180 Malignant neoplasm of cecum: Secondary | ICD-10-CM | POA: Diagnosis not present

## 2019-08-26 DIAGNOSIS — Z7984 Long term (current) use of oral hypoglycemic drugs: Secondary | ICD-10-CM | POA: Insufficient documentation

## 2019-08-26 DIAGNOSIS — Z79899 Other long term (current) drug therapy: Secondary | ICD-10-CM | POA: Diagnosis not present

## 2019-08-26 DIAGNOSIS — G309 Alzheimer's disease, unspecified: Secondary | ICD-10-CM | POA: Insufficient documentation

## 2019-08-26 DIAGNOSIS — E785 Hyperlipidemia, unspecified: Secondary | ICD-10-CM | POA: Diagnosis not present

## 2019-08-26 DIAGNOSIS — Z809 Family history of malignant neoplasm, unspecified: Secondary | ICD-10-CM

## 2019-08-26 DIAGNOSIS — D72810 Lymphocytopenia: Secondary | ICD-10-CM | POA: Diagnosis not present

## 2019-08-26 DIAGNOSIS — I252 Old myocardial infarction: Secondary | ICD-10-CM | POA: Diagnosis not present

## 2019-08-26 DIAGNOSIS — Z7982 Long term (current) use of aspirin: Secondary | ICD-10-CM | POA: Diagnosis not present

## 2019-08-26 DIAGNOSIS — Z794 Long term (current) use of insulin: Secondary | ICD-10-CM | POA: Insufficient documentation

## 2019-08-26 DIAGNOSIS — D696 Thrombocytopenia, unspecified: Secondary | ICD-10-CM | POA: Diagnosis not present

## 2019-08-26 DIAGNOSIS — N189 Chronic kidney disease, unspecified: Secondary | ICD-10-CM | POA: Diagnosis not present

## 2019-08-26 SURGERY — COLECTOMY, RIGHT, LAPAROSCOPIC
Anesthesia: General | Laterality: Right

## 2019-08-26 NOTE — Progress Notes (Signed)
Patient has lost 14 pounds since his colectomy surgery on 08/12/19.
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
## 2019-11-03 ENCOUNTER — Other Ambulatory Visit: Payer: Self-pay

## 2019-11-03 DIAGNOSIS — Z20822 Contact with and (suspected) exposure to covid-19: Secondary | ICD-10-CM

## 2019-11-05 LAB — NOVEL CORONAVIRUS, NAA: SARS-CoV-2, NAA: NOT DETECTED

## 2019-11-06 ENCOUNTER — Telehealth: Payer: Self-pay | Admitting: Family Medicine

## 2019-11-06 NOTE — Telephone Encounter (Signed)
Negative COVID results given. Patient results "NOT Detected." Caller expressed understanding. ° °

## 2019-12-11 DIAGNOSIS — E538 Deficiency of other specified B group vitamins: Secondary | ICD-10-CM | POA: Diagnosis not present

## 2019-12-23 DIAGNOSIS — E139 Other specified diabetes mellitus without complications: Secondary | ICD-10-CM | POA: Diagnosis not present

## 2019-12-23 DIAGNOSIS — Z85038 Personal history of other malignant neoplasm of large intestine: Secondary | ICD-10-CM | POA: Diagnosis not present

## 2019-12-23 DIAGNOSIS — G301 Alzheimer's disease with late onset: Secondary | ICD-10-CM | POA: Diagnosis not present

## 2019-12-23 DIAGNOSIS — F0280 Dementia in other diseases classified elsewhere without behavioral disturbance: Secondary | ICD-10-CM | POA: Diagnosis not present

## 2019-12-23 DIAGNOSIS — R197 Diarrhea, unspecified: Secondary | ICD-10-CM | POA: Diagnosis not present

## 2019-12-24 DIAGNOSIS — R197 Diarrhea, unspecified: Secondary | ICD-10-CM | POA: Diagnosis not present

## 2019-12-29 DIAGNOSIS — G3184 Mild cognitive impairment, so stated: Secondary | ICD-10-CM | POA: Diagnosis not present

## 2019-12-29 DIAGNOSIS — K591 Functional diarrhea: Secondary | ICD-10-CM | POA: Diagnosis not present

## 2019-12-29 DIAGNOSIS — E538 Deficiency of other specified B group vitamins: Secondary | ICD-10-CM | POA: Diagnosis not present

## 2019-12-29 DIAGNOSIS — Z7189 Other specified counseling: Secondary | ICD-10-CM | POA: Diagnosis not present

## 2019-12-29 DIAGNOSIS — R42 Dizziness and giddiness: Secondary | ICD-10-CM | POA: Diagnosis not present

## 2019-12-29 DIAGNOSIS — R197 Diarrhea, unspecified: Secondary | ICD-10-CM | POA: Diagnosis not present

## 2019-12-29 DIAGNOSIS — Z8669 Personal history of other diseases of the nervous system and sense organs: Secondary | ICD-10-CM | POA: Diagnosis not present

## 2019-12-29 DIAGNOSIS — E519 Thiamine deficiency, unspecified: Secondary | ICD-10-CM | POA: Diagnosis not present

## 2020-01-04 ENCOUNTER — Other Ambulatory Visit: Payer: Self-pay

## 2020-01-04 ENCOUNTER — Encounter: Payer: Self-pay | Admitting: Emergency Medicine

## 2020-01-04 ENCOUNTER — Emergency Department
Admission: EM | Admit: 2020-01-04 | Discharge: 2020-01-04 | Disposition: A | Discharge status: Home / Self Care | Payer: Medicare HMO | Attending: Emergency Medicine | Admitting: Emergency Medicine

## 2020-01-04 DIAGNOSIS — I2510 Atherosclerotic heart disease of native coronary artery without angina pectoris: Secondary | ICD-10-CM | POA: Diagnosis not present

## 2020-01-04 DIAGNOSIS — E860 Dehydration: Secondary | ICD-10-CM | POA: Diagnosis not present

## 2020-01-04 DIAGNOSIS — I10 Essential (primary) hypertension: Secondary | ICD-10-CM | POA: Insufficient documentation

## 2020-01-04 DIAGNOSIS — E119 Type 2 diabetes mellitus without complications: Secondary | ICD-10-CM | POA: Insufficient documentation

## 2020-01-04 DIAGNOSIS — I252 Old myocardial infarction: Secondary | ICD-10-CM | POA: Insufficient documentation

## 2020-01-04 DIAGNOSIS — R197 Diarrhea, unspecified: Secondary | ICD-10-CM | POA: Insufficient documentation

## 2020-01-04 LAB — BASIC METABOLIC PANEL
Anion gap: 9 (ref 5–15)
BUN: 19 mg/dL (ref 8–23)
CO2: 24 mmol/L (ref 22–32)
Calcium: 8.2 mg/dL — ABNORMAL LOW (ref 8.9–10.3)
Chloride: 99 mmol/L (ref 98–111)
Creatinine, Ser: 1.69 mg/dL — ABNORMAL HIGH (ref 0.61–1.24)
GFR calc Af Amer: 44 mL/min — ABNORMAL LOW (ref >60)
GFR calc non Af Amer: 38 mL/min — ABNORMAL LOW (ref >60)
Glucose, Bld: 68 mg/dL — ABNORMAL LOW (ref 70–99)
Potassium: 4.8 mmol/L (ref 3.5–5.1)
Sodium: 132 mmol/L — ABNORMAL LOW (ref 135–145)

## 2020-01-04 LAB — CBC
HCT: 31.5 % — ABNORMAL LOW (ref 39.0–52.0)
Hemoglobin: 9.8 g/dL — ABNORMAL LOW (ref 13.0–17.0)
MCH: 23.1 pg — ABNORMAL LOW (ref 26.0–34.0)
MCHC: 31.1 g/dL (ref 30.0–36.0)
MCV: 74.3 fL — ABNORMAL LOW (ref 80.0–100.0)
Platelets: 208 10*3/uL (ref 150–400)
RBC: 4.24 MIL/uL (ref 4.22–5.81)
RDW: 14.1 % (ref 11.5–15.5)
WBC: 4.9 10*3/uL (ref 4.0–10.5)
nRBC: 0 % (ref 0.0–0.2)

## 2020-01-04 LAB — COMPREHENSIVE METABOLIC PANEL
ALT: 15 U/L (ref 0–44)
AST: 16 U/L (ref 15–41)
Albumin: 2.9 g/dL — ABNORMAL LOW (ref 3.5–5.0)
Alkaline Phosphatase: 64 U/L (ref 38–126)
Anion gap: 8 (ref 5–15)
BUN: 20 mg/dL (ref 8–23)
CO2: 24 mmol/L (ref 22–32)
Calcium: 8.3 mg/dL — ABNORMAL LOW (ref 8.9–10.3)
Chloride: 97 mmol/L — ABNORMAL LOW (ref 98–111)
Creatinine, Ser: 1.84 mg/dL — ABNORMAL HIGH (ref 0.61–1.24)
GFR calc Af Amer: 40 mL/min — ABNORMAL LOW (ref >60)
GFR calc non Af Amer: 34 mL/min — ABNORMAL LOW (ref >60)
Glucose, Bld: 108 mg/dL — ABNORMAL HIGH (ref 70–99)
Potassium: 5.6 mmol/L — ABNORMAL HIGH (ref 3.5–5.1)
Sodium: 129 mmol/L — ABNORMAL LOW (ref 135–145)
Total Bilirubin: 0.7 mg/dL (ref 0.3–1.2)
Total Protein: 6.5 g/dL (ref 6.5–8.1)

## 2020-01-04 LAB — CLOSTRIDIUM DIFFICILE BY PCR, REFLEXED: Toxigenic C. Difficile by PCR: POSITIVE — AB

## 2020-01-04 LAB — C DIFFICILE QUICK SCREEN W PCR REFLEX
C Diff antigen: POSITIVE — AB
C Diff toxin: NEGATIVE

## 2020-01-04 LAB — LIPASE, BLOOD: Lipase: 19 U/L (ref 11–51)

## 2020-01-04 MED ORDER — SODIUM CHLORIDE 0.9 % IV BOLUS
500.0000 mL | Freq: Once | INTRAVENOUS | Status: AC
  Administered 2020-01-04: 500 mL via INTRAVENOUS

## 2020-01-04 MED ORDER — SODIUM CHLORIDE 0.9% FLUSH: 3.0000 mL | Freq: Once | INTRAVENOUS | Status: DC

## 2020-01-04 MED ORDER — LACTATED RINGERS IV BOLUS
1000.0000 mL | Freq: Once | INTRAVENOUS | Status: AC
  Administered 2020-01-04: 12:00:00 1000 mL via INTRAVENOUS

## 2020-01-04 NOTE — ED Notes (Signed)
Pt verbalized understanding of discharge instructions and follow up care. Pt is in NAD at this time.

## 2020-01-04 NOTE — ED Triage Notes (Signed)
C/O diarrhea x 3 weeks.  Arrives for ED evaluation for dehydration.

## 2020-01-04 NOTE — ED Triage Notes (Signed)
First Nurse Note:  Sent to ED for evaluation for possible dehydration.  Patient with C/O diarrhea
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
## 2020-01-05 ENCOUNTER — Other Ambulatory Visit
Admission: RE | Admit: 2020-01-05 | Discharge: 2020-01-05 | Disposition: A | Discharge status: Home / Self Care | Payer: Medicare HMO | Source: Ambulatory Visit | Attending: Internal Medicine | Admitting: Internal Medicine

## 2020-01-05 ENCOUNTER — Other Ambulatory Visit: Payer: Self-pay

## 2020-01-05 DIAGNOSIS — U071 COVID-19: Secondary | ICD-10-CM | POA: Insufficient documentation

## 2020-01-05 DIAGNOSIS — Z01812 Encounter for preprocedural laboratory examination: Secondary | ICD-10-CM | POA: Diagnosis not present

## 2020-01-05 LAB — SARS CORONAVIRUS 2 (TAT 6-24 HRS): SARS Coronavirus 2: POSITIVE — AB

## 2020-01-06 ENCOUNTER — Other Ambulatory Visit: Payer: Self-pay | Admitting: Neurology

## 2020-01-06 ENCOUNTER — Telehealth: Payer: Self-pay | Admitting: Internal Medicine

## 2020-01-06 DIAGNOSIS — G3184 Mild cognitive impairment, so stated: Secondary | ICD-10-CM

## 2020-01-06 DIAGNOSIS — U071 COVID-19: Secondary | ICD-10-CM

## 2020-01-06 NOTE — Telephone Encounter (Signed)
Called to discuss with Bethel Born about Covid symptoms and the use of bamlanivimab, a monoclonal antibody infusion for those with mild to moderate Covid symptoms and at a high risk of hospitalization.    Pt does not qualify for infusion therapy as he has asymptomatic infection. Isolation precautions discussed. Advised to contact back for consideration should he develop symptoms. Long discussion with wife with pt listening in background. Covid test was done as screening for colonoscopy. He has had diarrhea for about 4 weeks. He has hx of colon cancer with surgery in 08/2019. Tomorrow's colonoscopy was to work this up. It does not seem diarrhea is r/t covid. Wife given symptoms to look out for in both her and in pt and to call clinic should they occur.       Patient Active Problem List   Diagnosis Date Noted  . Colon cancer ([place]) 08/12/2019  . Hepatitis   . Septicemia ([place])   . SIRS (systemic inflammatory response syndrome) ([place]) 12/09/2016  . Abnormal LFTs 12/09/2016  . Acute viral syndrome 12/09/2016  . S/P left THA, 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  . Obese 02/16/2015  . KNEE PAIN, LEFT 10/21/2008  . Diabetes mellitus without complication ([place]) 123XX123  . 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 THROMBOCYTOPENIA, PRIMARY NOS 06/04/2007  . HYPERTENSION 06/04/2007     Alan Ripper, NP-C [place]  pgr [phone]

## 2020-01-07 ENCOUNTER — Encounter: Admission: RE | Payer: Self-pay | Source: Home / Self Care

## 2020-01-07 ENCOUNTER — Ambulatory Visit: Admission: RE | Admit: 2020-01-07 | Payer: Medicare HMO | Source: Home / Self Care | Admitting: Internal Medicine

## 2020-01-07 SURGERY — COLONOSCOPY WITH PROPOFOL
Anesthesia: General

## 2020-01-15 ENCOUNTER — Ambulatory Visit: Payer: Medicare HMO | Attending: Internal Medicine

## 2020-01-15 DIAGNOSIS — Z20822 Contact with and (suspected) exposure to covid-19: Secondary | ICD-10-CM

## 2020-01-16 LAB — NOVEL CORONAVIRUS, NAA: SARS-CoV-2, NAA: NOT DETECTED

## 2020-01-28 ENCOUNTER — Ambulatory Visit: Payer: Medicare HMO | Attending: Internal Medicine

## 2020-01-28 DIAGNOSIS — Z23 Encounter for immunization: Secondary | ICD-10-CM | POA: Insufficient documentation

## 2020-01-28 NOTE — Progress Notes (Signed)
   Covid-19 Vaccination Clinic  Name:  Daniel Sherman    MRN: [id number] DOB: 08-08-1941  01/28/2020  [name] was observed post Covid-19 immunization for 15 minutes without incidence. He was provided with Vaccine Information Sheet and instruction to access the V-Safe system.   [name] was instructed to call 911 with any severe reactions post vaccine: [name] Difficulty breathing  . Swelling of your face and throat  . A fast heartbeat  . A bad rash all over your body  . Dizziness and weakness    Immunizations Administered    Name Date Dose VIS Date Route   Pfizer COVID-19 Vaccine 01/28/2020  9:52 AM 0.3 mL 11/13/2019 Intramuscular   Manufacturer: [place]   Lot: [id number]   [place]: [id number]

## 2020-02-04 ENCOUNTER — Ambulatory Visit: Payer: Medicare HMO

## 2020-02-15 ENCOUNTER — Inpatient Hospital Stay: Payer: Medicare HMO | Attending: Oncology

## 2020-02-15 ENCOUNTER — Other Ambulatory Visit: Payer: Self-pay

## 2020-02-15 DIAGNOSIS — Z8042 Family history of malignant neoplasm of prostate: Secondary | ICD-10-CM | POA: Insufficient documentation

## 2020-02-15 DIAGNOSIS — E119 Type 2 diabetes mellitus without complications: Secondary | ICD-10-CM | POA: Diagnosis not present

## 2020-02-15 DIAGNOSIS — N189 Chronic kidney disease, unspecified: Secondary | ICD-10-CM | POA: Insufficient documentation

## 2020-02-15 DIAGNOSIS — D500 Iron deficiency anemia secondary to blood loss (chronic): Secondary | ICD-10-CM | POA: Insufficient documentation

## 2020-02-15 DIAGNOSIS — Z79899 Other long term (current) drug therapy: Secondary | ICD-10-CM | POA: Insufficient documentation

## 2020-02-15 DIAGNOSIS — C180 Malignant neoplasm of cecum: Secondary | ICD-10-CM

## 2020-02-15 DIAGNOSIS — I10 Essential (primary) hypertension: Secondary | ICD-10-CM | POA: Diagnosis not present

## 2020-02-15 DIAGNOSIS — Z7982 Long term (current) use of aspirin: Secondary | ICD-10-CM | POA: Insufficient documentation

## 2020-02-15 DIAGNOSIS — D696 Thrombocytopenia, unspecified: Secondary | ICD-10-CM

## 2020-02-15 DIAGNOSIS — I252 Old myocardial infarction: Secondary | ICD-10-CM | POA: Diagnosis not present

## 2020-02-15 DIAGNOSIS — D72810 Lymphocytopenia: Secondary | ICD-10-CM

## 2020-02-15 DIAGNOSIS — G309 Alzheimer's disease, unspecified: Secondary | ICD-10-CM | POA: Insufficient documentation

## 2020-02-15 DIAGNOSIS — E785 Hyperlipidemia, unspecified: Secondary | ICD-10-CM | POA: Diagnosis not present

## 2020-02-15 DIAGNOSIS — Z800 Family history of malignant neoplasm of digestive organs: Secondary | ICD-10-CM | POA: Diagnosis not present

## 2020-02-15 DIAGNOSIS — Z809 Family history of malignant neoplasm, unspecified: Secondary | ICD-10-CM

## 2020-02-15 LAB — CBC WITH DIFFERENTIAL/PLATELET
Abs Immature Granulocytes: 0.01 10*3/uL (ref 0.00–0.07)
Basophils Absolute: 0 10*3/uL (ref 0.0–0.1)
Basophils Relative: 1 %
Eosinophils Absolute: 0.1 10*3/uL (ref 0.0–0.5)
Eosinophils Relative: 5 %
HCT: 32 % — ABNORMAL LOW (ref 39.0–52.0)
Hemoglobin: 9.8 g/dL — ABNORMAL LOW (ref 13.0–17.0)
Immature Granulocytes: 0 %
Lymphocytes Relative: 25 %
Lymphs Abs: 0.7 10*3/uL (ref 0.7–4.0)
MCH: 24 pg — ABNORMAL LOW (ref 26.0–34.0)
MCHC: 30.6 g/dL (ref 30.0–36.0)
MCV: 78.2 fL — ABNORMAL LOW (ref 80.0–100.0)
Monocytes Absolute: 0.3 10*3/uL (ref 0.1–1.0)
Monocytes Relative: 9 %
Neutro Abs: 1.7 10*3/uL (ref 1.7–7.7)
Neutrophils Relative %: 60 %
Platelets: 99 10*3/uL — ABNORMAL LOW (ref 150–400)
RBC: 4.09 MIL/uL — ABNORMAL LOW (ref 4.22–5.81)
RDW: 15.9 % — ABNORMAL HIGH (ref 11.5–15.5)
WBC: 2.8 10*3/uL — ABNORMAL LOW (ref 4.0–10.5)
nRBC: 0 % (ref 0.0–0.2)

## 2020-02-15 LAB — COMPREHENSIVE METABOLIC PANEL
ALT: 21 U/L (ref 0–44)
AST: 17 U/L (ref 15–41)
Albumin: 4 g/dL (ref 3.5–5.0)
Alkaline Phosphatase: 73 U/L (ref 38–126)
Anion gap: 5 (ref 5–15)
BUN: 19 mg/dL (ref 8–23)
CO2: 25 mmol/L (ref 22–32)
Calcium: 8.5 mg/dL — ABNORMAL LOW (ref 8.9–10.3)
Chloride: 106 mmol/L (ref 98–111)
Creatinine, Ser: 1.35 mg/dL — ABNORMAL HIGH (ref 0.61–1.24)
GFR calc Af Amer: 58 mL/min — ABNORMAL LOW (ref >60)
GFR calc non Af Amer: 50 mL/min — ABNORMAL LOW (ref >60)
Glucose, Bld: 109 mg/dL — ABNORMAL HIGH (ref 70–99)
Potassium: 4.4 mmol/L (ref 3.5–5.1)
Sodium: 136 mmol/L (ref 135–145)
Total Bilirubin: 0.7 mg/dL (ref 0.3–1.2)
Total Protein: 7.2 g/dL (ref 6.5–8.1)

## 2020-02-15 LAB — IMMATURE PLATELET FRACTION: Immature Platelet Fraction: 1.5 % (ref 1.2–8.6)

## 2020-02-16 LAB — CEA: CEA: 1.1 ng/mL (ref 0.0–4.7)

## 2020-02-17 ENCOUNTER — Telehealth: Payer: Self-pay | Admitting: *Deleted

## 2020-02-17 LAB — COMP PANEL: LEUKEMIA/LYMPHOMA

## 2020-02-17 NOTE — Telephone Encounter (Signed)
Wife called asking for lab results.    Ref Range & Units 2 d ago  WBC 4.0 - 10.5 K/uL 2.8Low    RBC 4.22 - 5.81 MIL/uL 4.09Low    Hemoglobin 13.0 - 17.0 g/dL 9.8Low    Comment: Reticulocyte Hemoglobin testing  may be clinically indicated,  consider ordering this additional  test [id number]   HCT 39.0 - 52.0 % 32.0Low    MCV 80.0 - 100.0 fL 78.2Low    MCH 26.0 - 34.0 pg 24.0Low    MCHC 30.0 - 36.0 g/dL 30.6   RDW 11.5 - 15.5 % 15.9High    Platelets 150 - 400 K/uL 99Low    nRBC 0.0 - 0.2 % 0.0   Neutrophils Relative % % 60   Neutro Abs 1.7 - 7.7 K/uL 1.7   Lymphocytes Relative % 25   Lymphs Abs 0.7 - 4.0 K/uL 0.7   Monocytes Relative % 9   Monocytes Absolute 0.1 - 1.0 K/uL 0.3   Eosinophils Relative % 5   Eosinophils Absolute 0.0 - 0.5 K/uL 0.1   Basophils Relative % 1   Basophils Absolute 0.0 - 0.1 K/uL 0.0   Immature Granulocytes % 0   Abs Immature Granulocytes 0.00 - 0.07 K/uL 0.01   Comment: Performed at [facility], [address], [place], [place] [address]  Resulting Agency  [facility] CLIN LAB      Specimen Collected: 02/15/20 11:05  Last Resulted: 02/15/20 11:19     Lab Flowsheet   Order Details   View Encounter   Lab and Collection Details   Routing   Result History         Other Results from 02/15/2020  Flow cytometry panel-leukemia/lymphoma work-up  Status:  In process  Visible to patient:  No (not released)  Next appt:  02/22/2020 at 10:15 AM in Oncology [name], MD)  Dx:  Lymphocytopenia; Thrombocytopenia (HC... Order: [id number]     Specimen Collected: 02/15/20 11:05  Last Resulted: 02/15/20 11:07     Order Details   View Encounter   Lab and Collection Details   Routing   Result History           Immature Platelet Fraction  Status:  Final result  Visible to patient:  No (inaccessible in MyChart)  Next appt:  02/22/2020 at 10:15 AM in Oncology [name], MD)  Dx:  Lymphocytopenia; Thrombocytopenia (HC... Order: [id number]  Ref Range & Units 2 d ago  Immature Platelet Fraction 1.2 - 8.6 % 1.5   Comment: Performed at [facility], [address] [place], [place] [address]  Resulting Agency  [facility] CLIN LAB      Specimen Collected: 02/15/20 11:05  Last Resulted: 02/15/20 11:19     Lab Flowsheet   Order Details   View Encounter   Lab and Collection Details   Routing   Result History           CEA  Status:  Final result  Visible to patient:  No (inaccessible in MyChart)  Next appt:  02/22/2020 at 10:15 AM in Oncology [name], MD)  Dx:  Thrombocytopenia ([place]); Family histor... Order: [id number]  Ref Range & Units 2 d ago  CEA 0.0 - 4.7 ng/mL 1.1   Comment: (NOTE)                Nonsmokers     <3.9                Smokers       <  5.6  Roche Diagnostics Electrochemiluminescence Immunoassay  (ECLIA)  Values obtained with different assay methods or kits  cannot be used interchangeably. Results cannot be  interpreted as absolute evidence of the presence or  absence of malignant disease.  Performed At: [facility]  [place], [place] [id number]  Rush Farmer MD [id number]   Resulting Agency  [facility] CLIN LAB      Specimen Collected: 02/15/20 11:05  Last Resulted: 02/16/20 06:36     Lab Flowsheet   Order Details   View Encounter   Lab and Collection Details   Routing   Result History           Contains abnormal data Comprehensive metabolic panel  Status:  Final result  Visible to patient:  No (inaccessible in MyChart)  Next appt:  02/22/2020 at 10:15 AM in Oncology [name], MD)  Dx:  Thrombocytopenia ([place]); Family histor... Order: [id number]  Ref Range & Units 2 d ago  Sodium 135 - 145 mmol/L 136   Potassium 3.5 - 5.1 mmol/L 4.4   Chloride 98 - 111 mmol/L 106   CO2 22 - 32 mmol/L 25   Glucose, Bld 70 - 99 mg/dL 109High    Comment: Glucose reference range applies only to samples taken after fasting for  at least 8 hours.  BUN 8 - 23 mg/dL 19   Creatinine, Ser 0.61 - 1.24 mg/dL 1.35High    Calcium 8.9 - 10.3 mg/dL 8.5Low    Total Protein 6.5 - 8.1 g/dL 7.2   Albumin 3.5 - 5.0 g/dL 4.0   AST 15 - 41 U/L 17   ALT 0 - 44 U/L 21   Alkaline Phosphatase 38 - 126 U/L 73   Total Bilirubin 0.3 - 1.2 mg/dL 0.7   GFR calc non Af Amer >60 mL/min 50Low    GFR calc Af Amer >60 mL/min 58Low    Anion gap 5 - 15 5   Comment: Performed at [facility], [address] [place], [place] [address]  Resulting Agency  [facility] CLIN LAB      Specimen Collected: 02/15/20 11:05  Last Resulted: 02/15/20 11:33

## 2020-02-17 NOTE — Telephone Encounter (Signed)
Decreased hemoglobin, and MCV.  Platelet is at his baseline due to splenomegaly.  Please arrange him to have another lab encounter for iron tibc ferritin, B12 and folate. Thank you.  Add possible IV venofer to his next visit. Thank you.

## 2020-02-17 NOTE — Telephone Encounter (Signed)
Pt has MD appt on 3/22 for lab discussion. Some labs are still pending.

## 2020-02-17 NOTE — Telephone Encounter (Signed)
Call returned to Mrs [facility] and explained to her that not all results are abck yet and that he has an appointment on 3/22 to discuss results. She thanked me for calling her back.

## 2020-02-18 ENCOUNTER — Inpatient Hospital Stay: Payer: Medicare HMO

## 2020-02-18 DIAGNOSIS — C180 Malignant neoplasm of cecum: Secondary | ICD-10-CM | POA: Diagnosis not present

## 2020-02-18 DIAGNOSIS — E119 Type 2 diabetes mellitus without complications: Secondary | ICD-10-CM | POA: Diagnosis not present

## 2020-02-18 DIAGNOSIS — D500 Iron deficiency anemia secondary to blood loss (chronic): Secondary | ICD-10-CM | POA: Diagnosis not present

## 2020-02-18 DIAGNOSIS — I252 Old myocardial infarction: Secondary | ICD-10-CM | POA: Diagnosis not present

## 2020-02-18 DIAGNOSIS — D696 Thrombocytopenia, unspecified: Secondary | ICD-10-CM | POA: Diagnosis not present

## 2020-02-18 DIAGNOSIS — Z8042 Family history of malignant neoplasm of prostate: Secondary | ICD-10-CM | POA: Diagnosis not present

## 2020-02-18 DIAGNOSIS — E785 Hyperlipidemia, unspecified: Secondary | ICD-10-CM | POA: Diagnosis not present

## 2020-02-18 DIAGNOSIS — I10 Essential (primary) hypertension: Secondary | ICD-10-CM | POA: Diagnosis not present

## 2020-02-18 DIAGNOSIS — G309 Alzheimer's disease, unspecified: Secondary | ICD-10-CM | POA: Diagnosis not present

## 2020-02-18 LAB — IRON AND TIBC
Iron: 26 ug/dL — ABNORMAL LOW (ref 45–182)
Saturation Ratios: 6 % — ABNORMAL LOW (ref 17.9–39.5)
TIBC: 416 ug/dL (ref 250–450)
UIBC: 390 ug/dL

## 2020-02-18 LAB — FOLATE: Folate: 35 ng/mL (ref >5.9)

## 2020-02-18 LAB — FERRITIN: Ferritin: 6 ng/mL — ABNORMAL LOW (ref 24–336)

## 2020-02-18 LAB — VITAMIN B12: Vitamin B-12: 210 pg/mL (ref 180–914)

## 2020-02-18 NOTE — Telephone Encounter (Signed)
Mrs. Fass notified and accepts to bring patient in today for labs. Ellison Hughs please schedule labs today at 1p and add venofer to appt on 3/22. Thanks. Patient and wife are aware.

## 2020-02-22 ENCOUNTER — Other Ambulatory Visit: Payer: Self-pay

## 2020-02-22 ENCOUNTER — Encounter: Payer: Self-pay | Admitting: Oncology

## 2020-02-22 ENCOUNTER — Inpatient Hospital Stay: Payer: Medicare HMO

## 2020-02-22 ENCOUNTER — Inpatient Hospital Stay (HOSPITAL_BASED_OUTPATIENT_CLINIC_OR_DEPARTMENT_OTHER): Payer: Medicare HMO | Admitting: Oncology

## 2020-02-22 VITALS — BP 152/66 | HR 91 | Temp 98.9°F | Resp 16 | Wt 201.3 lb

## 2020-02-22 DIAGNOSIS — Z8042 Family history of malignant neoplasm of prostate: Secondary | ICD-10-CM | POA: Diagnosis not present

## 2020-02-22 DIAGNOSIS — D500 Iron deficiency anemia secondary to blood loss (chronic): Secondary | ICD-10-CM

## 2020-02-22 DIAGNOSIS — E119 Type 2 diabetes mellitus without complications: Secondary | ICD-10-CM | POA: Diagnosis not present

## 2020-02-22 DIAGNOSIS — D696 Thrombocytopenia, unspecified: Secondary | ICD-10-CM | POA: Diagnosis not present

## 2020-02-22 DIAGNOSIS — D509 Iron deficiency anemia, unspecified: Secondary | ICD-10-CM

## 2020-02-22 DIAGNOSIS — C180 Malignant neoplasm of cecum: Secondary | ICD-10-CM | POA: Diagnosis not present

## 2020-02-22 DIAGNOSIS — E785 Hyperlipidemia, unspecified: Secondary | ICD-10-CM | POA: Diagnosis not present

## 2020-02-22 DIAGNOSIS — G309 Alzheimer's disease, unspecified: Secondary | ICD-10-CM | POA: Diagnosis not present

## 2020-02-22 DIAGNOSIS — I252 Old myocardial infarction: Secondary | ICD-10-CM | POA: Diagnosis not present

## 2020-02-22 DIAGNOSIS — I10 Essential (primary) hypertension: Secondary | ICD-10-CM | POA: Diagnosis not present

## 2020-02-22 HISTORY — DX: Iron deficiency anemia, unspecified: D50.9

## 2020-02-22 MED ORDER — FERROUS SULFATE 325 (65 FE) MG PO TBEC: 325.0000 mg | DELAYED_RELEASE_TABLET | Freq: Two times a day (BID) | ORAL | 3 refills | Status: DC
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
## 2020-02-22 NOTE — Progress Notes (Signed)
Patient does not offer any problems today.  

## 2020-02-22 NOTE — Progress Notes (Signed)
Per Dr. Tasia Catchings hold iron infusion today due to patient and his wife refusing Venofer treatment.

## 2020-02-23 ENCOUNTER — Other Ambulatory Visit: Payer: Self-pay | Admitting: *Deleted

## 2020-02-23 ENCOUNTER — Ambulatory Visit: Payer: Medicare HMO | Attending: Internal Medicine

## 2020-02-23 DIAGNOSIS — Z23 Encounter for immunization: Secondary | ICD-10-CM

## 2020-02-23 MED ORDER — FERROUS SULFATE 325 (65 FE) MG PO TBEC: 325.0000 mg | DELAYED_RELEASE_TABLET | Freq: Two times a day (BID) | ORAL | 3 refills | Status: DC

## 2020-02-23 NOTE — Progress Notes (Signed)
   Covid-19 Vaccination Clinic  Name:  Daniel Sherman    MRN: [id number] DOB: 1940-12-07  02/23/2020  [name] was observed post Covid-19 immunization for 15 minutes without incident. He was provided with Vaccine Information Sheet and instruction to access the V-Safe system.   [name] was instructed to call 911 with any severe reactions post vaccine: [name] Difficulty breathing  . Swelling of face and throat  . A fast heartbeat  . A bad rash all over body  . Dizziness and weakness   Immunizations Administered    Name Date Dose VIS Date Route   Pfizer COVID-19 Vaccine 02/23/2020  9:44 AM 0.3 mL 11/13/2019 Intramuscular   Manufacturer: [place]   Lot: [id number]   [place]: [id number]

## 2020-02-26 DIAGNOSIS — E538 Deficiency of other specified B group vitamins: Secondary | ICD-10-CM | POA: Diagnosis not present

## 2020-03-01 ENCOUNTER — Ambulatory Visit
Admission: RE | Admit: 2020-03-01 | Discharge: 2020-03-01 | Disposition: A | Discharge status: Home / Self Care | Payer: Medicare HMO | Source: Ambulatory Visit | Attending: Oncology | Admitting: Oncology

## 2020-03-01 ENCOUNTER — Other Ambulatory Visit: Payer: Self-pay

## 2020-03-01 DIAGNOSIS — C189 Malignant neoplasm of colon, unspecified: Secondary | ICD-10-CM | POA: Diagnosis not present

## 2020-03-01 DIAGNOSIS — C180 Malignant neoplasm of cecum: Secondary | ICD-10-CM | POA: Diagnosis not present

## 2020-03-01 DIAGNOSIS — C182 Malignant neoplasm of ascending colon: Secondary | ICD-10-CM | POA: Diagnosis not present

## 2020-03-02 ENCOUNTER — Telehealth: Payer: Self-pay | Admitting: *Deleted

## 2020-03-02 NOTE — Telephone Encounter (Signed)
Called report  IMPRESSION: Status post right hemicolectomy. No evidence recurrent or metastatic disease.  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 Consider EUS for tissue sampling.  Cholelithiasis, without associated inflammatory changes.  These results will be called to the ordering clinician or representative by the Radiologist Assistant, and communication documented in the PACS or [employer].   Electronically Signed   By: Julian Hy M.D.   On: 03/02/2020 01:10

## 2020-03-03 ENCOUNTER — Telehealth: Payer: Self-pay | Admitting: Oncology

## 2020-03-03 ENCOUNTER — Telehealth: Payer: Self-pay

## 2020-03-03 DIAGNOSIS — D696 Thrombocytopenia, unspecified: Secondary | ICD-10-CM

## 2020-03-03 DIAGNOSIS — C180 Malignant neoplasm of cecum: Secondary | ICD-10-CM

## 2020-03-03 NOTE — Telephone Encounter (Signed)
Communicated with wife on the phone regarding patient's CT scan results. Wife agrees with plan of proceeding with EUS for biopsy.  All questions were answered.

## 2020-03-03 NOTE — Telephone Encounter (Signed)
Daniel Sherman is working on getting him set up for EUS at [facility] on 4/15.

## 2020-03-03 NOTE — Telephone Encounter (Signed)
Message from Dr. 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 Need EUS. I talked to his wife and she agrees.  Request for Steffanie Dunn S to help get a EUS spot?   Lab appt scheduled for cbc, retic panel, CA19.9

## 2020-03-04 ENCOUNTER — Other Ambulatory Visit: Payer: Self-pay

## 2020-03-04 ENCOUNTER — Inpatient Hospital Stay (HOSPITAL_BASED_OUTPATIENT_CLINIC_OR_DEPARTMENT_OTHER): Payer: Medicare HMO | Admitting: Oncology

## 2020-03-04 ENCOUNTER — Inpatient Hospital Stay: Payer: Medicare HMO | Attending: Oncology

## 2020-03-04 ENCOUNTER — Encounter: Payer: Self-pay | Admitting: Oncology

## 2020-03-04 VITALS — BP 153/69 | HR 86 | Temp 96.3°F | Resp 18 | Wt 196.7 lb

## 2020-03-04 DIAGNOSIS — C180 Malignant neoplasm of cecum: Secondary | ICD-10-CM

## 2020-03-04 DIAGNOSIS — I252 Old myocardial infarction: Secondary | ICD-10-CM | POA: Insufficient documentation

## 2020-03-04 DIAGNOSIS — K869 Disease of pancreas, unspecified: Secondary | ICD-10-CM | POA: Insufficient documentation

## 2020-03-04 DIAGNOSIS — Z7982 Long term (current) use of aspirin: Secondary | ICD-10-CM | POA: Diagnosis not present

## 2020-03-04 DIAGNOSIS — G309 Alzheimer's disease, unspecified: Secondary | ICD-10-CM | POA: Diagnosis not present

## 2020-03-04 DIAGNOSIS — R197 Diarrhea, unspecified: Secondary | ICD-10-CM

## 2020-03-04 DIAGNOSIS — I10 Essential (primary) hypertension: Secondary | ICD-10-CM | POA: Insufficient documentation

## 2020-03-04 DIAGNOSIS — Z794 Long term (current) use of insulin: Secondary | ICD-10-CM | POA: Diagnosis not present

## 2020-03-04 DIAGNOSIS — D696 Thrombocytopenia, unspecified: Secondary | ICD-10-CM | POA: Insufficient documentation

## 2020-03-04 DIAGNOSIS — D378 Neoplasm of uncertain behavior of other specified digestive organs: Secondary | ICD-10-CM | POA: Diagnosis not present

## 2020-03-04 DIAGNOSIS — E785 Hyperlipidemia, unspecified: Secondary | ICD-10-CM | POA: Insufficient documentation

## 2020-03-04 DIAGNOSIS — Z79899 Other long term (current) drug therapy: Secondary | ICD-10-CM | POA: Diagnosis not present

## 2020-03-04 DIAGNOSIS — D72810 Lymphocytopenia: Secondary | ICD-10-CM | POA: Diagnosis not present

## 2020-03-04 DIAGNOSIS — D509 Iron deficiency anemia, unspecified: Secondary | ICD-10-CM | POA: Diagnosis not present

## 2020-03-04 DIAGNOSIS — E119 Type 2 diabetes mellitus without complications: Secondary | ICD-10-CM | POA: Diagnosis not present

## 2020-03-04 LAB — CBC WITH DIFFERENTIAL/PLATELET
Abs Immature Granulocytes: 0.06 10*3/uL (ref 0.00–0.07)
Basophils Absolute: 0 10*3/uL (ref 0.0–0.1)
Basophils Relative: 0 %
Eosinophils Absolute: 0.2 10*3/uL (ref 0.0–0.5)
Eosinophils Relative: 3 %
HCT: 32.4 % — ABNORMAL LOW (ref 39.0–52.0)
Hemoglobin: 9.8 g/dL — ABNORMAL LOW (ref 13.0–17.0)
Immature Granulocytes: 1 %
Lymphocytes Relative: 19 %
Lymphs Abs: 1 10*3/uL (ref 0.7–4.0)
MCH: 23.8 pg — ABNORMAL LOW (ref 26.0–34.0)
MCHC: 30.2 g/dL (ref 30.0–36.0)
MCV: 78.6 fL — ABNORMAL LOW (ref 80.0–100.0)
Monocytes Absolute: 0.4 10*3/uL (ref 0.1–1.0)
Monocytes Relative: 7 %
Neutro Abs: 3.5 10*3/uL (ref 1.7–7.7)
Neutrophils Relative %: 70 %
Platelets: 95 10*3/uL — ABNORMAL LOW (ref 150–400)
RBC: 4.12 MIL/uL — ABNORMAL LOW (ref 4.22–5.81)
RDW: 15.9 % — ABNORMAL HIGH (ref 11.5–15.5)
WBC: 5 10*3/uL (ref 4.0–10.5)
nRBC: 0 % (ref 0.0–0.2)

## 2020-03-04 LAB — RETIC PANEL
Immature Retic Fract: 20.7 % — ABNORMAL HIGH (ref 2.3–15.9)
RBC.: 4.08 MIL/uL — ABNORMAL LOW (ref 4.22–5.81)
Retic Count, Absolute: 84 10*3/uL (ref 19.0–186.0)
Retic Ct Pct: 2.1 % (ref 0.4–3.1)
Reticulocyte Hemoglobin: 28 pg (ref >27.9)
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
## 2020-03-04 NOTE — Progress Notes (Signed)
Since starting oral iron a week ago he has been having diarrhea.  Took anti-diarrheal med this morning and it has helped.

## 2020-03-04 NOTE — Progress Notes (Signed)
Met with Mr. Lecomte and his spouse. Reviewed CT findings and provided copy of results. EUS scheduled for 4/15 at [facility] with Dr. Mont Dutton. Went over all instructions and provided printed copy. Copy of these instructions can be found under letters. COVID testing scheduled for 4/13 at the [facility] drive thru. Provided my contact information and encouraged them to call with any questions.
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
## 2020-03-05 LAB — CANCER ANTIGEN 19-9: CA 19-9: 6 U/mL (ref 0–35)

## 2020-03-07 ENCOUNTER — Other Ambulatory Visit: Payer: Self-pay | Admitting: Oncology

## 2020-03-07 ENCOUNTER — Other Ambulatory Visit: Payer: Self-pay

## 2020-03-07 ENCOUNTER — Telehealth: Payer: Self-pay

## 2020-03-07 DIAGNOSIS — C180 Malignant neoplasm of cecum: Secondary | ICD-10-CM | POA: Diagnosis not present

## 2020-03-07 DIAGNOSIS — D378 Neoplasm of uncertain behavior of other specified digestive organs: Secondary | ICD-10-CM | POA: Diagnosis not present

## 2020-03-07 DIAGNOSIS — E785 Hyperlipidemia, unspecified: Secondary | ICD-10-CM | POA: Diagnosis not present

## 2020-03-07 DIAGNOSIS — I10 Essential (primary) hypertension: Secondary | ICD-10-CM | POA: Diagnosis not present

## 2020-03-07 DIAGNOSIS — D509 Iron deficiency anemia, unspecified: Secondary | ICD-10-CM | POA: Diagnosis not present

## 2020-03-07 DIAGNOSIS — R197 Diarrhea, unspecified: Secondary | ICD-10-CM

## 2020-03-07 DIAGNOSIS — D696 Thrombocytopenia, unspecified: Secondary | ICD-10-CM | POA: Diagnosis not present

## 2020-03-07 DIAGNOSIS — E119 Type 2 diabetes mellitus without complications: Secondary | ICD-10-CM | POA: Diagnosis not present

## 2020-03-07 DIAGNOSIS — D72810 Lymphocytopenia: Secondary | ICD-10-CM | POA: Diagnosis not present

## 2020-03-07 DIAGNOSIS — G309 Alzheimer's disease, unspecified: Secondary | ICD-10-CM | POA: Diagnosis not present

## 2020-03-07 LAB — C DIFFICILE QUICK SCREEN W PCR REFLEX
C Diff antigen: POSITIVE — AB
C Diff interpretation: DETECTED
C Diff toxin: POSITIVE — AB

## 2020-03-07 MED ORDER — VANCOMYCIN HCL 125 MG PO CAPS: 125.0000 mg | ORAL_CAPSULE | Freq: Four times a day (QID) | ORAL | 0 refills | Status: DC

## 2020-03-07 NOTE — Telephone Encounter (Signed)
Critical results reported by Manuela Schwartz in main lab. Patient positive for C-diff. MD aware and has sent antibiotics to [facility]. Prescription changed to [facility] so pt can pick up ASAP.  Called [facility] pharm and cancelled mail delivery for vancomycin. Pt's wife aware of results and antibiotic needing to be picked up.

## 2020-03-10 ENCOUNTER — Other Ambulatory Visit: Payer: Self-pay

## 2020-03-10 ENCOUNTER — Inpatient Hospital Stay: Payer: Medicare HMO

## 2020-03-10 VITALS — BP 134/68 | HR 85 | Temp 97.9°F | Resp 20

## 2020-03-10 DIAGNOSIS — E119 Type 2 diabetes mellitus without complications: Secondary | ICD-10-CM | POA: Diagnosis not present

## 2020-03-10 DIAGNOSIS — D378 Neoplasm of uncertain behavior of other specified digestive organs: Secondary | ICD-10-CM | POA: Diagnosis not present

## 2020-03-10 DIAGNOSIS — D509 Iron deficiency anemia, unspecified: Secondary | ICD-10-CM | POA: Diagnosis not present

## 2020-03-10 DIAGNOSIS — D72810 Lymphocytopenia: Secondary | ICD-10-CM | POA: Diagnosis not present

## 2020-03-10 DIAGNOSIS — D696 Thrombocytopenia, unspecified: Secondary | ICD-10-CM | POA: Diagnosis not present

## 2020-03-10 DIAGNOSIS — G309 Alzheimer's disease, unspecified: Secondary | ICD-10-CM | POA: Diagnosis not present

## 2020-03-10 DIAGNOSIS — C180 Malignant neoplasm of cecum: Secondary | ICD-10-CM | POA: Diagnosis not present

## 2020-03-10 DIAGNOSIS — I10 Essential (primary) hypertension: Secondary | ICD-10-CM | POA: Diagnosis not present

## 2020-03-10 DIAGNOSIS — E785 Hyperlipidemia, unspecified: Secondary | ICD-10-CM | POA: Diagnosis not present

## 2020-03-10 DIAGNOSIS — D500 Iron deficiency anemia secondary to blood loss (chronic): Secondary | ICD-10-CM

## 2020-03-10 MED ORDER — SODIUM CHLORIDE 0.9 % IV SOLN
Freq: Once | INTRAVENOUS | Status: AC
  Filled 2020-03-10: qty 250

## 2020-03-10 MED ORDER — IRON SUCROSE 20 MG/ML IV SOLN
200.0000 mg | Freq: Once | INTRAVENOUS | Status: AC
  Administered 2020-03-10: 14:00:00 200 mg via INTRAVENOUS
  Filled 2020-03-10: qty 10

## 2020-03-10 MED ORDER — SODIUM CHLORIDE 0.9 % IV SOLN: 200.0000 mg | Freq: Once | INTRAVENOUS | Status: DC

## 2020-03-15 ENCOUNTER — Other Ambulatory Visit
Admission: RE | Admit: 2020-03-15 | Discharge: 2020-03-15 | Disposition: A | Discharge status: Home / Self Care | Payer: Medicare HMO | Source: Ambulatory Visit | Attending: Oncology | Admitting: Oncology

## 2020-03-15 NOTE — Pre-Procedure Instructions (Signed)
Patient was positive for covid on 01/05/20. He does not need to be swabbed prior to his upcoming procedure.

## 2020-03-16 ENCOUNTER — Other Ambulatory Visit: Payer: Self-pay

## 2020-03-16 ENCOUNTER — Inpatient Hospital Stay: Payer: Medicare HMO

## 2020-03-16 VITALS — BP 136/71 | HR 76 | Temp 97.6°F | Wt 200.4 lb

## 2020-03-16 DIAGNOSIS — D378 Neoplasm of uncertain behavior of other specified digestive organs: Secondary | ICD-10-CM | POA: Diagnosis not present

## 2020-03-16 DIAGNOSIS — E119 Type 2 diabetes mellitus without complications: Secondary | ICD-10-CM | POA: Diagnosis not present

## 2020-03-16 DIAGNOSIS — D72810 Lymphocytopenia: Secondary | ICD-10-CM | POA: Diagnosis not present

## 2020-03-16 DIAGNOSIS — D500 Iron deficiency anemia secondary to blood loss (chronic): Secondary | ICD-10-CM

## 2020-03-16 DIAGNOSIS — D696 Thrombocytopenia, unspecified: Secondary | ICD-10-CM | POA: Diagnosis not present

## 2020-03-16 DIAGNOSIS — I10 Essential (primary) hypertension: Secondary | ICD-10-CM | POA: Diagnosis not present

## 2020-03-16 DIAGNOSIS — D509 Iron deficiency anemia, unspecified: Secondary | ICD-10-CM | POA: Diagnosis not present

## 2020-03-16 DIAGNOSIS — G309 Alzheimer's disease, unspecified: Secondary | ICD-10-CM | POA: Diagnosis not present

## 2020-03-16 DIAGNOSIS — E785 Hyperlipidemia, unspecified: Secondary | ICD-10-CM | POA: Diagnosis not present

## 2020-03-16 DIAGNOSIS — C180 Malignant neoplasm of cecum: Secondary | ICD-10-CM | POA: Diagnosis not present

## 2020-03-16 MED ORDER — SODIUM CHLORIDE 0.9 % IV SOLN: 200.0000 mg | Freq: Once | INTRAVENOUS | Status: DC

## 2020-03-16 MED ORDER — SODIUM CHLORIDE 0.9 % IV SOLN
Freq: Once | INTRAVENOUS | Status: AC
  Filled 2020-03-16: qty 250

## 2020-03-16 MED ORDER — IRON SUCROSE 20 MG/ML IV SOLN
200.0000 mg | Freq: Once | INTRAVENOUS | Status: AC
  Administered 2020-03-16: 200 mg via INTRAVENOUS
  Filled 2020-03-16: qty 10

## 2020-03-17 ENCOUNTER — Encounter: Payer: Self-pay | Admitting: Internal Medicine

## 2020-03-17 ENCOUNTER — Ambulatory Visit: Payer: Medicare HMO | Admitting: Registered Nurse

## 2020-03-17 ENCOUNTER — Encounter
Admission: RE | Disposition: A | Discharge status: Home / Self Care | Payer: Self-pay | Source: Home / Self Care | Attending: Internal Medicine

## 2020-03-17 ENCOUNTER — Ambulatory Visit
Admission: RE | Admit: 2020-03-17 | Discharge: 2020-03-17 | Disposition: A | Discharge status: Home / Self Care | Payer: Medicare HMO | Attending: Internal Medicine | Admitting: Internal Medicine

## 2020-03-17 ENCOUNTER — Ambulatory Visit: Payer: Medicare HMO

## 2020-03-17 ENCOUNTER — Other Ambulatory Visit: Payer: Self-pay

## 2020-03-17 DIAGNOSIS — E119 Type 2 diabetes mellitus without complications: Secondary | ICD-10-CM | POA: Diagnosis not present

## 2020-03-17 DIAGNOSIS — Z7982 Long term (current) use of aspirin: Secondary | ICD-10-CM | POA: Diagnosis not present

## 2020-03-17 DIAGNOSIS — Z9049 Acquired absence of other specified parts of digestive tract: Secondary | ICD-10-CM | POA: Insufficient documentation

## 2020-03-17 DIAGNOSIS — I700 Atherosclerosis of aorta: Secondary | ICD-10-CM | POA: Diagnosis not present

## 2020-03-17 DIAGNOSIS — K5730 Diverticulosis of large intestine without perforation or abscess without bleeding: Secondary | ICD-10-CM | POA: Diagnosis not present

## 2020-03-17 DIAGNOSIS — E11319 Type 2 diabetes mellitus with unspecified diabetic retinopathy without macular edema: Secondary | ICD-10-CM | POA: Diagnosis not present

## 2020-03-17 DIAGNOSIS — I2510 Atherosclerotic heart disease of native coronary artery without angina pectoris: Secondary | ICD-10-CM | POA: Diagnosis not present

## 2020-03-17 DIAGNOSIS — Z79899 Other long term (current) drug therapy: Secondary | ICD-10-CM | POA: Diagnosis not present

## 2020-03-17 DIAGNOSIS — C252 Malignant neoplasm of tail of pancreas: Secondary | ICD-10-CM | POA: Diagnosis not present

## 2020-03-17 DIAGNOSIS — K8689 Other specified diseases of pancreas: Secondary | ICD-10-CM | POA: Diagnosis not present

## 2020-03-17 DIAGNOSIS — I10 Essential (primary) hypertension: Secondary | ICD-10-CM | POA: Diagnosis not present

## 2020-03-17 DIAGNOSIS — K8020 Calculus of gallbladder without cholecystitis without obstruction: Secondary | ICD-10-CM | POA: Insufficient documentation

## 2020-03-17 DIAGNOSIS — D509 Iron deficiency anemia, unspecified: Secondary | ICD-10-CM | POA: Diagnosis not present

## 2020-03-17 DIAGNOSIS — K219 Gastro-esophageal reflux disease without esophagitis: Secondary | ICD-10-CM | POA: Diagnosis not present

## 2020-03-17 DIAGNOSIS — E785 Hyperlipidemia, unspecified: Secondary | ICD-10-CM | POA: Insufficient documentation

## 2020-03-17 DIAGNOSIS — G309 Alzheimer's disease, unspecified: Secondary | ICD-10-CM | POA: Insufficient documentation

## 2020-03-17 DIAGNOSIS — M1990 Unspecified osteoarthritis, unspecified site: Secondary | ICD-10-CM | POA: Insufficient documentation

## 2020-03-17 DIAGNOSIS — I252 Old myocardial infarction: Secondary | ICD-10-CM | POA: Insufficient documentation

## 2020-03-17 DIAGNOSIS — C180 Malignant neoplasm of cecum: Secondary | ICD-10-CM | POA: Insufficient documentation

## 2020-03-17 DIAGNOSIS — Z8616 Personal history of COVID-19: Secondary | ICD-10-CM | POA: Diagnosis not present

## 2020-03-17 DIAGNOSIS — R933 Abnormal findings on diagnostic imaging of other parts of digestive tract: Secondary | ICD-10-CM | POA: Diagnosis not present

## 2020-03-17 DIAGNOSIS — F0280 Dementia in other diseases classified elsewhere without behavioral disturbance: Secondary | ICD-10-CM | POA: Diagnosis not present

## 2020-03-17 DIAGNOSIS — N281 Cyst of kidney, acquired: Secondary | ICD-10-CM | POA: Diagnosis not present

## 2020-03-17 DIAGNOSIS — D696 Thrombocytopenia, unspecified: Secondary | ICD-10-CM | POA: Insufficient documentation

## 2020-03-17 DIAGNOSIS — Z794 Long term (current) use of insulin: Secondary | ICD-10-CM | POA: Insufficient documentation

## 2020-03-17 DIAGNOSIS — K2970 Gastritis, unspecified, without bleeding: Secondary | ICD-10-CM | POA: Insufficient documentation

## 2020-03-17 HISTORY — PX: EUS: SHX5427

## 2020-03-17 LAB — GLUCOSE, CAPILLARY: Glucose-Capillary: 84 mg/dL (ref 70–99)

## 2020-03-17 SURGERY — ULTRASOUND, UPPER GI TRACT, ENDOSCOPIC
Anesthesia: General

## 2020-03-17 MED ORDER — LIDOCAINE HCL (CARDIAC) PF 100 MG/5ML IV SOSY
PREFILLED_SYRINGE | INTRAVENOUS | Status: DC | PRN
  Administered 2020-03-17: 40 mg via INTRAVENOUS

## 2020-03-17 MED ORDER — PROPOFOL 500 MG/50ML IV EMUL
INTRAVENOUS | Status: DC | PRN
  Administered 2020-03-17: 150 ug/kg/min via INTRAVENOUS

## 2020-03-17 MED ORDER — LIDOCAINE HCL (PF) 2 % IJ SOLN
INTRAMUSCULAR | Status: AC
  Filled 2020-03-17: qty 10

## 2020-03-17 MED ORDER — GLYCOPYRROLATE 0.2 MG/ML IJ SOLN
INTRAMUSCULAR | Status: DC | PRN
  Administered 2020-03-17: .2 mg via INTRAVENOUS

## 2020-03-17 MED ORDER — PROPOFOL 10 MG/ML IV BOLUS
INTRAVENOUS | Status: DC | PRN
  Administered 2020-03-17: 100 mg via INTRAVENOUS
  Administered 2020-03-17: 30 mg via INTRAVENOUS

## 2020-03-17 MED ORDER — SODIUM CHLORIDE 0.9 % IV SOLN
INTRAVENOUS | Status: DC
  Administered 2020-03-17: 1000 mL via INTRAVENOUS

## 2020-03-17 MED ORDER — PROPOFOL 10 MG/ML IV BOLUS
INTRAVENOUS | Status: AC
  Filled 2020-03-17: qty 20

## 2020-03-17 NOTE — Anesthesia Procedure Notes (Signed)
Date/Time: 03/17/2020 1:03 PM Performed by: Doreen Salvage, CRNA Pre-anesthesia Checklist: Patient identified, Emergency Drugs available, Suction available and Patient being monitored Patient Re-evaluated:Patient Re-evaluated prior to induction Oxygen Delivery Method: Nasal cannula Induction Type: IV induction Dental Injury: Teeth and Oropharynx as per pre-operative assessment  Comments: Nasal cannula with etCO2 monitoring

## 2020-03-17 NOTE — Anesthesia Postprocedure Evaluation (Signed)
Anesthesia Post Note  Patient: Daniel Sherman  Procedure(s) Performed: FULL UPPER ENDOSCOPIC ULTRASOUND (EUS) RADIAL (N/A )  Patient location during evaluation: Endoscopy Anesthesia Type: General Level of consciousness: awake and alert and oriented Pain management: pain level controlled Vital Signs Assessment: post-procedure vital signs reviewed and stable Respiratory status: spontaneous breathing Cardiovascular status: blood pressure returned to baseline Anesthetic complications: no     Last Vitals:  Vitals:   03/17/20 1400 03/17/20 1410  BP: 132/67 129/65  Pulse:    Resp: 15   Temp:    SpO2:      Last Pain:  Vitals:   03/17/20 1410  TempSrc:   PainSc: 0-No pain                 [name]

## 2020-03-17 NOTE — Interval H&P Note (Signed)
History and Physical Interval Note:  03/17/2020 12:52 PM  Daniel Sherman  has presented today for surgery, with the diagnosis of pancreatic tail mass.  The various methods of treatment have been discussed with the patient and family. After consideration of risks, benefits and other options for treatment, the patient has consented to  Procedure(s) with comments: FULL UPPER ENDOSCOPIC ULTRASOUND (EUS) RADIAL (N/A) - COVID POSITIVE ON Jan 05, 2020 as a surgical intervention.  The patient's history has been reviewed, patient examined, no change in status, stable for surgery.  I have reviewed the patient's chart and labs.  Questions were answered to the patient's satisfaction.     Tillie Rung
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
## 2020-03-17 NOTE — Op Note (Signed)
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 Providers:             Murray Hodgkins. [place] Referring MD:          Irven Easterly. Kary Kos, MD (Referring MD), [name], MD                         (Referring MD) Medicines:             Propofol per Anesthesia Complications:         No immediate complications. Procedure:             Pre-Anesthesia Assessment:                        Prior to the procedure, a History and Physical was                         performed, and patient medications and allergies were                         reviewed. The patient is competent. The risks and                         benefits of the procedure and the sedation options and                         risks were discussed with the patient. All questions                         were answered and informed consent was obtained.                         Patient identification and proposed procedure were                         verified by the physician, the nurse and the                         anesthesiologist in the pre-procedure area. Mental                         Status Examination: alert and oriented. Airway  Examination: normal oropharyngeal airway and neck                         mobility. Respiratory Examination: clear to                         auscultation. CV Examination: normal. Prophylactic                         Antibiotics: The patient  does not require prophylactic                         antibiotics. Prior Anticoagulants: The patient has                         taken no previous anticoagulant or antiplatelet                         agents. ASA Grade Assessment: III - A patient with                         severe systemic disease. After reviewing the risks and                         benefits, the patient was deemed in satisfactory                         condition to undergo the procedure. The anesthesia                         plan was to use monitored anesthesia care (MAC).                         Immediately prior to administration of medications,                         the patient was re-assessed for adequacy to receive                         sedatives. The heart rate, respiratory rate, oxygen                         saturations, blood pressure, adequacy of pulmonary                         ventilation, and response to care were monitored                         throughout the procedure. The physical status of the                         patient was re-assessed after the procedure.                        After obtaining informed consent, the endoscope was                         passed under direct vision. Throughout the procedure,  the patient's blood pressure, pulse, and oxygen                         saturations were monitored continuously. The Endoscope                         was introduced through the mouth, and advanced to the                         second part of duodenum. The was introduced through                         the mouth, and advanced to the duodenum for ultrasound                         examination from the esophagus, stomach and duodenum.                         The upper EUS was accomplished without difficulty. The                         patient tolerated the procedure well. Findings:      ENDOSCOPIC FINDING: :      The examined esophagus was endoscopically  normal.      Localized mild inflammation characterized by erythema was found in the       gastric antrum.      The examined duodenum was endoscopically normal.      ENDOSONOGRAPHIC FINDING: :      An oval mass was identified in the pancreatic tail. The mass was       hypoechoic. The mass measured 22.1 mm by 20.0 mm in maximal       cross-sectional diameter. The endosonographic borders were well-defined.       An intact interface was seen between the mass and the adjacent       structures suggesting a lack of invasion. Fine needle biopsy was       performed. Color Doppler imaging was utilized prior to needle puncture       to confirm a lack of significant vascular structures within the needle       path. Four passes were made with the 25 gauge Medtronic SharkCore biopsy       needle using a transgastric approach. A visible core of tissue was       obtained. Final cytology results are pending.      There was otherwise no sign of significant endosonographic abnormality       in the pancreatic head, genu of the pancreas, pancreatic body and       pancreatic tail. The PD measured 1.0 mm in the head, 0.7 mm in the neck,       0.6 mm in the body, and 1.0 mm in the tail.      There was no sign of significant endosonographic abnormality in the       common bile duct (3.5 mm) and in the common hepatic duct (3.6 mm).      Multiple stones were visualized endosonographically in the gallbladder.       The stones were oval. They were hyperechoic and characterized by       shadowing.      Endosonographic imaging in the left lobe  of the liver showed no       abnormalities.      No lymphadenopathy seen.      The celiac region was visualized and showed no sign of significant       endosonographic abnormality. Impression:            EGD Impressions:                        - Normal esophagus.                        - Gastritis.                        - Normal examined duodenum.                        EUS  Impressions:                        - A round well defined mass/lesion was identified in                         the pancreatic tail. Cytology results are pending.                         However, the endosonographic differential includes a                         neuroendocrine tumor versus intrapancreatic splenule                         (same echodensity as the adjacent spleen). This was                         staged T2 N0 Mx by endosonographic criteria. The                         staging applies if malignancy is confirmed. Fine                         needle biopsy performed.                        - There was otherwise no sign of significant pathology                         in the pancreatic head, genu of the pancreas,                         pancreatic body and pancreatic tail.                        - There was no sign of significant pathology in the                         common bile duct and in the common hepatic duct.                        - Multiple stones were visualized endosonographically  in the gallbladder.                        - No lymphadenopathy seen.                        - Normal visualized portions of the liver. Recommendation:        - Discharge patient to home (ambulatory).                        - Await cytology results.                        - If cytology is nondiagnostic, consider a heat                         damaged RBC scan to exclude a splenule.                        - The findings and recommendations were discussed with                         the patient.                        - Return to referring physician as previously                         scheduled. Further plan of care to be determined by                         the referring physicians. Procedure Code(s):     --- Professional ---                        [phone], Esophagogastroduodenoscopy, flexible,                         transoral; with transendoscopic  ultrasound-guided                         intramural or transmural fine needle                         aspiration/biopsy(s) (includes endoscopic ultrasound                         examination of the esophagus, stomach, and either the                         duodenum or a surgically altered stomach where the                         jejunum is examined distal to the anastomosis) Diagnosis Code(s):     --- Professional ---                        R93.3, Abnormal findings on diagnostic imaging of                         other parts of digestive tract  K86.89, Other specified diseases of pancreas                        K29.70, Gastritis, unspecified, without bleeding                        K80.20, Calculus of gallbladder without cholecystitis                         without obstruction CPT copyright 2019 [employer]. All rights reserved. The codes documented in this report are preliminary and upon coder review may  be revised to meet current compliance requirements. Attending Participation:      I personally performed the entire procedure without the assistance of a       fellow, resident or surgical assistant. [place],  03/17/2020 1:55:30 PM This report has been signed electronically. Number of Addenda: 0 Note Initiated On: 03/17/2020 12:23 PM Estimated Blood Loss:  Estimated blood loss: none.      [facility]

## 2020-03-17 NOTE — Discharge Instructions (Signed)
Discharge to home °

## 2020-03-17 NOTE — Transfer of Care (Signed)
Immediate Anesthesia Transfer of Care Note  Patient: Daniel Sherman  Procedure(s) Performed: Procedure(s) with comments: FULL UPPER ENDOSCOPIC ULTRASOUND (EUS) RADIAL (N/A) - COVID POSITIVE ON Jan 05, 2020  Patient Location: PACU and Endoscopy Unit  Anesthesia Type:General  Level of Consciousness: sedated  Airway & Oxygen Therapy: Patient Spontanous Breathing and Patient connected to nasal cannula oxygen  Post-op Assessment: Report given to RN and Post -op Vital signs reviewed and stable  Post vital signs: Reviewed and stable  Last Vitals:  Vitals:   03/17/20 1223 03/17/20 1340  BP: (!) 141/70 114/65  Pulse: 77 85  Resp: 18 20  Temp: (!) 36.1 C (!) 36.1 C  SpO2: [id number]    Complications: No apparent anesthesia complications

## 2020-03-18 ENCOUNTER — Encounter: Payer: Self-pay | Admitting: *Deleted

## 2020-03-22 ENCOUNTER — Inpatient Hospital Stay: Payer: Medicare HMO

## 2020-03-22 ENCOUNTER — Inpatient Hospital Stay (HOSPITAL_BASED_OUTPATIENT_CLINIC_OR_DEPARTMENT_OTHER): Payer: Medicare HMO | Admitting: Oncology

## 2020-03-22 ENCOUNTER — Other Ambulatory Visit: Payer: Self-pay

## 2020-03-22 ENCOUNTER — Telehealth: Payer: Self-pay

## 2020-03-22 ENCOUNTER — Encounter: Payer: Self-pay | Admitting: Oncology

## 2020-03-22 VITALS — BP 143/67 | HR 85 | Temp 96.2°F | Resp 16 | Wt 196.3 lb

## 2020-03-22 DIAGNOSIS — D500 Iron deficiency anemia secondary to blood loss (chronic): Secondary | ICD-10-CM | POA: Diagnosis not present

## 2020-03-22 DIAGNOSIS — D72810 Lymphocytopenia: Secondary | ICD-10-CM | POA: Diagnosis not present

## 2020-03-22 DIAGNOSIS — C180 Malignant neoplasm of cecum: Secondary | ICD-10-CM | POA: Diagnosis not present

## 2020-03-22 DIAGNOSIS — E119 Type 2 diabetes mellitus without complications: Secondary | ICD-10-CM | POA: Diagnosis not present

## 2020-03-22 DIAGNOSIS — G309 Alzheimer's disease, unspecified: Secondary | ICD-10-CM | POA: Diagnosis not present

## 2020-03-22 DIAGNOSIS — K869 Disease of pancreas, unspecified: Secondary | ICD-10-CM

## 2020-03-22 DIAGNOSIS — I10 Essential (primary) hypertension: Secondary | ICD-10-CM | POA: Diagnosis not present

## 2020-03-22 DIAGNOSIS — D3A8 Other benign neuroendocrine tumors: Secondary | ICD-10-CM

## 2020-03-22 DIAGNOSIS — D509 Iron deficiency anemia, unspecified: Secondary | ICD-10-CM | POA: Diagnosis not present

## 2020-03-22 DIAGNOSIS — D378 Neoplasm of uncertain behavior of other specified digestive organs: Secondary | ICD-10-CM | POA: Diagnosis not present

## 2020-03-22 DIAGNOSIS — E785 Hyperlipidemia, unspecified: Secondary | ICD-10-CM | POA: Diagnosis not present

## 2020-03-22 DIAGNOSIS — D696 Thrombocytopenia, unspecified: Secondary | ICD-10-CM | POA: Diagnosis not present

## 2020-03-22 NOTE — Progress Notes (Signed)
Patient is here to discuss bx results.

## 2020-03-22 NOTE — Progress Notes (Signed)
Referral sent to [facility] surgery at [facility]

## 2020-03-22 NOTE — Telephone Encounter (Signed)
Per Message from [place] (lab) : Two of  the the test (Glucagon, Vasoactive intestinal peptide) that were ordered on this patient are very specific test that require a special kit from [facility]. They are special plasma tubes that contain an additive called Trasylol. It states on [facility]'s website that these kits must be requested from the [place] at [facility].  I contacted [facility] and they will send tubes, we should receive them within the next 3 days.   Pt will need to come back for remaining labs once we obtain tubes. Josh notified pt that he will need to come back for labs.

## 2020-03-23 ENCOUNTER — Telehealth: Payer: Self-pay

## 2020-03-23 LAB — INSULIN AND C-PEPTIDE, SERUM
C-Peptide: 3.4 ng/mL (ref 1.1–4.4)
Insulin: 24.5 u[IU]/mL (ref 2.6–24.9)

## 2020-03-23 NOTE — Telephone Encounter (Signed)
Scheduled to see Dr. Mariah Milling, 03/29/20 at 1230, arrive at 1215. Provided address. Read back performed.
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
## 2020-03-24 ENCOUNTER — Other Ambulatory Visit: Payer: Self-pay

## 2020-03-24 ENCOUNTER — Telehealth: Payer: Self-pay

## 2020-03-24 ENCOUNTER — Inpatient Hospital Stay: Payer: Medicare HMO

## 2020-03-24 VITALS — BP 121/56 | HR 74 | Temp 96.8°F | Resp 18

## 2020-03-24 DIAGNOSIS — D500 Iron deficiency anemia secondary to blood loss (chronic): Secondary | ICD-10-CM

## 2020-03-24 DIAGNOSIS — D72810 Lymphocytopenia: Secondary | ICD-10-CM | POA: Diagnosis not present

## 2020-03-24 DIAGNOSIS — G309 Alzheimer's disease, unspecified: Secondary | ICD-10-CM | POA: Diagnosis not present

## 2020-03-24 DIAGNOSIS — C180 Malignant neoplasm of cecum: Secondary | ICD-10-CM | POA: Diagnosis not present

## 2020-03-24 DIAGNOSIS — I10 Essential (primary) hypertension: Secondary | ICD-10-CM | POA: Diagnosis not present

## 2020-03-24 DIAGNOSIS — D509 Iron deficiency anemia, unspecified: Secondary | ICD-10-CM | POA: Diagnosis not present

## 2020-03-24 DIAGNOSIS — E119 Type 2 diabetes mellitus without complications: Secondary | ICD-10-CM | POA: Diagnosis not present

## 2020-03-24 DIAGNOSIS — D696 Thrombocytopenia, unspecified: Secondary | ICD-10-CM | POA: Diagnosis not present

## 2020-03-24 DIAGNOSIS — E785 Hyperlipidemia, unspecified: Secondary | ICD-10-CM | POA: Diagnosis not present

## 2020-03-24 DIAGNOSIS — D378 Neoplasm of uncertain behavior of other specified digestive organs: Secondary | ICD-10-CM | POA: Diagnosis not present

## 2020-03-24 MED ORDER — SODIUM CHLORIDE 0.9 % IV SOLN
Freq: Once | INTRAVENOUS | Status: AC
  Filled 2020-03-24: qty 250

## 2020-03-24 MED ORDER — IRON SUCROSE 20 MG/ML IV SOLN
200.0000 mg | Freq: Once | INTRAVENOUS | Status: AC
  Administered 2020-03-24: 200 mg via INTRAVENOUS
  Filled 2020-03-24: qty 10

## 2020-03-24 NOTE — Telephone Encounter (Signed)
Contacted Mr & Mrs Rashed, to let them know that Dr. Tasia Catchings wants a PET scan done, but pt's wife states that pt has an appt with the cancer doctor next week in [place] and want to see them first before scheduling anything. She refused genetic testing.

## 2020-03-24 NOTE — Progress Notes (Signed)
Pt tolerated infusion well. Pt and VS stable at discharge.  

## 2020-03-25 ENCOUNTER — Ambulatory Visit: Payer: Medicare HMO

## 2020-03-25 NOTE — Telephone Encounter (Signed)
Ellison Hughs please call and schedule pt for lab only one day next week. Thanks.

## 2020-03-25 NOTE — Telephone Encounter (Signed)
Done...  Pt has been scheduled for Labs Only per MD.. Pts wife Daniel Sherman [facility]) is aware of pts 03/28/20 appt.

## 2020-03-28 ENCOUNTER — Other Ambulatory Visit: Payer: Self-pay

## 2020-03-28 ENCOUNTER — Inpatient Hospital Stay: Payer: Medicare HMO

## 2020-03-28 DIAGNOSIS — D3A8 Other benign neuroendocrine tumors: Secondary | ICD-10-CM

## 2020-03-28 DIAGNOSIS — C180 Malignant neoplasm of cecum: Secondary | ICD-10-CM | POA: Diagnosis not present

## 2020-03-28 DIAGNOSIS — E119 Type 2 diabetes mellitus without complications: Secondary | ICD-10-CM | POA: Diagnosis not present

## 2020-03-28 DIAGNOSIS — I10 Essential (primary) hypertension: Secondary | ICD-10-CM | POA: Diagnosis not present

## 2020-03-28 DIAGNOSIS — D378 Neoplasm of uncertain behavior of other specified digestive organs: Secondary | ICD-10-CM | POA: Diagnosis not present

## 2020-03-28 DIAGNOSIS — D509 Iron deficiency anemia, unspecified: Secondary | ICD-10-CM | POA: Diagnosis not present

## 2020-03-28 DIAGNOSIS — D72810 Lymphocytopenia: Secondary | ICD-10-CM | POA: Diagnosis not present

## 2020-03-28 DIAGNOSIS — G309 Alzheimer's disease, unspecified: Secondary | ICD-10-CM | POA: Diagnosis not present

## 2020-03-28 DIAGNOSIS — E785 Hyperlipidemia, unspecified: Secondary | ICD-10-CM | POA: Diagnosis not present

## 2020-03-28 DIAGNOSIS — D696 Thrombocytopenia, unspecified: Secondary | ICD-10-CM | POA: Diagnosis not present

## 2020-03-28 LAB — CYTOLOGY - NON PAP

## 2020-03-29 DIAGNOSIS — D3A8 Other benign neuroendocrine tumors: Secondary | ICD-10-CM | POA: Diagnosis not present

## 2020-03-29 DIAGNOSIS — Z87891 Personal history of nicotine dependence: Secondary | ICD-10-CM | POA: Diagnosis not present

## 2020-03-29 LAB — GASTRIN

## 2020-03-31 ENCOUNTER — Telehealth: Payer: Self-pay | Admitting: *Deleted

## 2020-03-31 NOTE — Telephone Encounter (Signed)
Call returned and questions answered

## 2020-03-31 NOTE — Telephone Encounter (Signed)
Wife called requesting a return call about his labs and his iron infusions [phone]

## 2020-04-01 ENCOUNTER — Inpatient Hospital Stay: Payer: Medicare HMO

## 2020-04-01 ENCOUNTER — Other Ambulatory Visit: Payer: Self-pay

## 2020-04-01 VITALS — BP 112/53 | HR 89 | Temp 96.0°F | Resp 18

## 2020-04-01 DIAGNOSIS — D696 Thrombocytopenia, unspecified: Secondary | ICD-10-CM | POA: Diagnosis not present

## 2020-04-01 DIAGNOSIS — D378 Neoplasm of uncertain behavior of other specified digestive organs: Secondary | ICD-10-CM | POA: Diagnosis not present

## 2020-04-01 DIAGNOSIS — I10 Essential (primary) hypertension: Secondary | ICD-10-CM | POA: Diagnosis not present

## 2020-04-01 DIAGNOSIS — G309 Alzheimer's disease, unspecified: Secondary | ICD-10-CM | POA: Diagnosis not present

## 2020-04-01 DIAGNOSIS — E785 Hyperlipidemia, unspecified: Secondary | ICD-10-CM | POA: Diagnosis not present

## 2020-04-01 DIAGNOSIS — E119 Type 2 diabetes mellitus without complications: Secondary | ICD-10-CM | POA: Diagnosis not present

## 2020-04-01 DIAGNOSIS — C180 Malignant neoplasm of cecum: Secondary | ICD-10-CM | POA: Diagnosis not present

## 2020-04-01 DIAGNOSIS — D72810 Lymphocytopenia: Secondary | ICD-10-CM | POA: Diagnosis not present

## 2020-04-01 DIAGNOSIS — E538 Deficiency of other specified B group vitamins: Secondary | ICD-10-CM | POA: Diagnosis not present

## 2020-04-01 DIAGNOSIS — D500 Iron deficiency anemia secondary to blood loss (chronic): Secondary | ICD-10-CM

## 2020-04-01 DIAGNOSIS — D509 Iron deficiency anemia, unspecified: Secondary | ICD-10-CM | POA: Diagnosis not present

## 2020-04-01 LAB — VASOACTIVE INTESTINAL PEPTIDE (VIP): Vasoactive Intest Polypeptide: 39.4 pg/mL (ref 0.0–58.8)

## 2020-04-01 MED ORDER — IRON SUCROSE 20 MG/ML IV SOLN
200.0000 mg | Freq: Once | INTRAVENOUS | Status: AC
  Administered 2020-04-01: 200 mg via INTRAVENOUS
  Filled 2020-04-01: qty 10

## 2020-04-01 MED ORDER — SODIUM CHLORIDE 0.9 % IV SOLN
Freq: Once | INTRAVENOUS | Status: AC
  Filled 2020-04-01: qty 250

## 2020-04-03 ENCOUNTER — Encounter: Payer: Self-pay | Admitting: Oncology

## 2020-04-03 DIAGNOSIS — Z7189 Other specified counseling: Secondary | ICD-10-CM | POA: Insufficient documentation

## 2020-04-04 DIAGNOSIS — R197 Diarrhea, unspecified: Secondary | ICD-10-CM | POA: Diagnosis not present

## 2020-04-08 LAB — GLUCAGON: Glucagon Lvl: 104 pg/mL (ref 50–150)

## 2020-04-08 LAB — PANCREATIC POLYPEPTIDE: Pancreatic Polypeptide: 1221.2 pg/mL — ABNORMAL HIGH (ref 0.0–418.0)

## 2020-04-12 DIAGNOSIS — F0280 Dementia in other diseases classified elsewhere without behavioral disturbance: Secondary | ICD-10-CM | POA: Diagnosis not present

## 2020-04-12 DIAGNOSIS — Z85038 Personal history of other malignant neoplasm of large intestine: Secondary | ICD-10-CM | POA: Diagnosis not present

## 2020-04-12 DIAGNOSIS — A0471 Enterocolitis due to Clostridium difficile, recurrent: Secondary | ICD-10-CM | POA: Diagnosis not present

## 2020-04-12 DIAGNOSIS — E139 Other specified diabetes mellitus without complications: Secondary | ICD-10-CM | POA: Diagnosis not present

## 2020-04-12 DIAGNOSIS — D3A8 Other benign neuroendocrine tumors: Secondary | ICD-10-CM | POA: Diagnosis not present

## 2020-04-12 DIAGNOSIS — Z86010 Personal history of colonic polyps: Secondary | ICD-10-CM | POA: Diagnosis not present

## 2020-04-12 DIAGNOSIS — G301 Alzheimer's disease with late onset: Secondary | ICD-10-CM | POA: Diagnosis not present

## 2020-04-15 ENCOUNTER — Telehealth: Payer: Self-pay

## 2020-04-15 NOTE — Telephone Encounter (Signed)
Noted that with recent cdiff infection, [facility] appts/pet will be rescheduled until after vancomycin is completed. Currently still scheduled for 5-18. Will follow for new dates.

## 2020-04-29 DIAGNOSIS — E538 Deficiency of other specified B group vitamins: Secondary | ICD-10-CM | POA: Diagnosis not present

## 2020-05-04 DIAGNOSIS — E785 Hyperlipidemia, unspecified: Secondary | ICD-10-CM | POA: Diagnosis not present

## 2020-05-04 DIAGNOSIS — E538 Deficiency of other specified B group vitamins: Secondary | ICD-10-CM | POA: Diagnosis not present

## 2020-05-09 DIAGNOSIS — E785 Hyperlipidemia, unspecified: Secondary | ICD-10-CM | POA: Diagnosis not present

## 2020-05-09 DIAGNOSIS — N1832 Chronic kidney disease, stage 3b: Secondary | ICD-10-CM | POA: Diagnosis not present

## 2020-05-09 DIAGNOSIS — I25118 Atherosclerotic heart disease of native coronary artery with other forms of angina pectoris: Secondary | ICD-10-CM | POA: Diagnosis not present

## 2020-05-09 DIAGNOSIS — E139 Other specified diabetes mellitus without complications: Secondary | ICD-10-CM | POA: Diagnosis not present

## 2020-05-09 DIAGNOSIS — Z0000 Encounter for general adult medical examination without abnormal findings: Secondary | ICD-10-CM | POA: Diagnosis not present

## 2020-05-09 DIAGNOSIS — F0390 Unspecified dementia without behavioral disturbance: Secondary | ICD-10-CM | POA: Diagnosis not present

## 2020-05-23 ENCOUNTER — Other Ambulatory Visit: Payer: Self-pay

## 2020-05-23 ENCOUNTER — Inpatient Hospital Stay: Payer: Medicare HMO | Attending: Oncology

## 2020-05-23 DIAGNOSIS — Z85038 Personal history of other malignant neoplasm of large intestine: Secondary | ICD-10-CM | POA: Diagnosis not present

## 2020-05-23 DIAGNOSIS — D696 Thrombocytopenia, unspecified: Secondary | ICD-10-CM | POA: Diagnosis not present

## 2020-05-23 DIAGNOSIS — Z8249 Family history of ischemic heart disease and other diseases of the circulatory system: Secondary | ICD-10-CM | POA: Insufficient documentation

## 2020-05-23 DIAGNOSIS — I252 Old myocardial infarction: Secondary | ICD-10-CM | POA: Insufficient documentation

## 2020-05-23 DIAGNOSIS — D72810 Lymphocytopenia: Secondary | ICD-10-CM | POA: Insufficient documentation

## 2020-05-23 DIAGNOSIS — K219 Gastro-esophageal reflux disease without esophagitis: Secondary | ICD-10-CM | POA: Insufficient documentation

## 2020-05-23 DIAGNOSIS — F0280 Dementia in other diseases classified elsewhere without behavioral disturbance: Secondary | ICD-10-CM | POA: Diagnosis not present

## 2020-05-23 DIAGNOSIS — Z7982 Long term (current) use of aspirin: Secondary | ICD-10-CM | POA: Insufficient documentation

## 2020-05-23 DIAGNOSIS — I2510 Atherosclerotic heart disease of native coronary artery without angina pectoris: Secondary | ICD-10-CM | POA: Diagnosis not present

## 2020-05-23 DIAGNOSIS — C180 Malignant neoplasm of cecum: Secondary | ICD-10-CM

## 2020-05-23 DIAGNOSIS — I10 Essential (primary) hypertension: Secondary | ICD-10-CM | POA: Insufficient documentation

## 2020-05-23 DIAGNOSIS — C7A098 Malignant carcinoid tumors of other sites: Secondary | ICD-10-CM | POA: Insufficient documentation

## 2020-05-23 DIAGNOSIS — D509 Iron deficiency anemia, unspecified: Secondary | ICD-10-CM | POA: Insufficient documentation

## 2020-05-23 DIAGNOSIS — E119 Type 2 diabetes mellitus without complications: Secondary | ICD-10-CM | POA: Insufficient documentation

## 2020-05-23 DIAGNOSIS — Z79899 Other long term (current) drug therapy: Secondary | ICD-10-CM | POA: Insufficient documentation

## 2020-05-23 DIAGNOSIS — E785 Hyperlipidemia, unspecified: Secondary | ICD-10-CM | POA: Diagnosis not present

## 2020-05-23 DIAGNOSIS — G309 Alzheimer's disease, unspecified: Secondary | ICD-10-CM | POA: Insufficient documentation

## 2020-05-23 DIAGNOSIS — Z794 Long term (current) use of insulin: Secondary | ICD-10-CM | POA: Diagnosis not present

## 2020-05-23 LAB — IRON AND TIBC
Iron: 40 ug/dL — ABNORMAL LOW (ref 45–182)
Saturation Ratios: 11 % — ABNORMAL LOW (ref 17.9–39.5)
TIBC: 370 ug/dL (ref 250–450)
UIBC: 330 ug/dL

## 2020-05-23 LAB — COMPREHENSIVE METABOLIC PANEL
ALT: 20 U/L (ref 0–44)
AST: 17 U/L (ref 15–41)
Albumin: 4.4 g/dL (ref 3.5–5.0)
Alkaline Phosphatase: 79 U/L (ref 38–126)
Anion gap: 9 (ref 5–15)
BUN: 31 mg/dL — ABNORMAL HIGH (ref 8–23)
CO2: 25 mmol/L (ref 22–32)
Calcium: 8.9 mg/dL (ref 8.9–10.3)
Chloride: 102 mmol/L (ref 98–111)
Creatinine, Ser: 1.8 mg/dL — ABNORMAL HIGH (ref 0.61–1.24)
GFR calc Af Amer: 41 mL/min — ABNORMAL LOW (ref >60)
GFR calc non Af Amer: 35 mL/min — ABNORMAL LOW (ref >60)
Glucose, Bld: 87 mg/dL (ref 70–99)
Potassium: 5 mmol/L (ref 3.5–5.1)
Sodium: 136 mmol/L (ref 135–145)
Total Bilirubin: 0.7 mg/dL (ref 0.3–1.2)
Total Protein: 7.9 g/dL (ref 6.5–8.1)

## 2020-05-23 LAB — CBC WITH DIFFERENTIAL/PLATELET
Abs Immature Granulocytes: 0.02 10*3/uL (ref 0.00–0.07)
Basophils Absolute: 0 10*3/uL (ref 0.0–0.1)
Basophils Relative: 1 %
Eosinophils Absolute: 0.3 10*3/uL (ref 0.0–0.5)
Eosinophils Relative: 6 %
HCT: 39.7 % (ref 39.0–52.0)
Hemoglobin: 13.1 g/dL (ref 13.0–17.0)
Immature Granulocytes: 0 %
Lymphocytes Relative: 27 %
Lymphs Abs: 1.3 10*3/uL (ref 0.7–4.0)
MCH: 26 pg (ref 26.0–34.0)
MCHC: 33 g/dL (ref 30.0–36.0)
MCV: 78.8 fL — ABNORMAL LOW (ref 80.0–100.0)
Monocytes Absolute: 0.4 10*3/uL (ref 0.1–1.0)
Monocytes Relative: 7 %
Neutro Abs: 2.9 10*3/uL (ref 1.7–7.7)
Neutrophils Relative %: 59 %
Platelets: 113 10*3/uL — ABNORMAL LOW (ref 150–400)
RBC: 5.04 MIL/uL (ref 4.22–5.81)
RDW: 15.9 % — ABNORMAL HIGH (ref 11.5–15.5)
WBC: 4.9 10*3/uL (ref 4.0–10.5)
nRBC: 0 % (ref 0.0–0.2)

## 2020-05-23 LAB — FERRITIN: Ferritin: 36 ng/mL (ref 24–336)

## 2020-05-24 ENCOUNTER — Inpatient Hospital Stay: Payer: Medicare HMO

## 2020-05-24 ENCOUNTER — Inpatient Hospital Stay: Payer: Medicare HMO | Admitting: Oncology

## 2020-05-24 LAB — CEA: CEA: 1.1 ng/mL (ref 0.0–4.7)

## 2020-05-30 ENCOUNTER — Inpatient Hospital Stay: Payer: Medicare HMO

## 2020-05-30 ENCOUNTER — Encounter: Payer: Self-pay | Admitting: Oncology

## 2020-05-30 ENCOUNTER — Other Ambulatory Visit: Payer: Self-pay

## 2020-05-30 ENCOUNTER — Inpatient Hospital Stay: Payer: Medicare HMO | Admitting: Oncology

## 2020-05-30 VITALS — BP 126/71 | HR 80 | Temp 97.4°F | Resp 16 | Wt 199.3 lb

## 2020-05-30 DIAGNOSIS — F0280 Dementia in other diseases classified elsewhere without behavioral disturbance: Secondary | ICD-10-CM | POA: Diagnosis not present

## 2020-05-30 DIAGNOSIS — D500 Iron deficiency anemia secondary to blood loss (chronic): Secondary | ICD-10-CM | POA: Diagnosis not present

## 2020-05-30 DIAGNOSIS — D72810 Lymphocytopenia: Secondary | ICD-10-CM | POA: Diagnosis not present

## 2020-05-30 DIAGNOSIS — C180 Malignant neoplasm of cecum: Secondary | ICD-10-CM

## 2020-05-30 DIAGNOSIS — Z85038 Personal history of other malignant neoplasm of large intestine: Secondary | ICD-10-CM | POA: Diagnosis not present

## 2020-05-30 DIAGNOSIS — D509 Iron deficiency anemia, unspecified: Secondary | ICD-10-CM | POA: Diagnosis not present

## 2020-05-30 DIAGNOSIS — C7A098 Malignant carcinoid tumors of other sites: Secondary | ICD-10-CM | POA: Diagnosis not present

## 2020-05-30 DIAGNOSIS — D3A8 Other benign neuroendocrine tumors: Secondary | ICD-10-CM

## 2020-05-30 DIAGNOSIS — G309 Alzheimer's disease, unspecified: Secondary | ICD-10-CM | POA: Diagnosis not present

## 2020-05-30 DIAGNOSIS — E538 Deficiency of other specified B group vitamins: Secondary | ICD-10-CM | POA: Diagnosis not present

## 2020-05-30 DIAGNOSIS — E119 Type 2 diabetes mellitus without complications: Secondary | ICD-10-CM | POA: Diagnosis not present

## 2020-05-30 DIAGNOSIS — E785 Hyperlipidemia, unspecified: Secondary | ICD-10-CM | POA: Diagnosis not present

## 2020-05-30 DIAGNOSIS — D696 Thrombocytopenia, unspecified: Secondary | ICD-10-CM | POA: Diagnosis not present

## 2020-05-30 NOTE — Progress Notes (Signed)
Patient denies new problems/concerns today.  He is scheduled for PET scan at [facility] tomorrow.
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
## 2020-05-31 DIAGNOSIS — Z9049 Acquired absence of other specified parts of digestive tract: Secondary | ICD-10-CM | POA: Diagnosis not present

## 2020-05-31 DIAGNOSIS — C7A8 Other malignant neuroendocrine tumors: Secondary | ICD-10-CM | POA: Diagnosis not present

## 2020-05-31 DIAGNOSIS — D3A8 Other benign neuroendocrine tumors: Secondary | ICD-10-CM | POA: Diagnosis not present

## 2020-05-31 DIAGNOSIS — C19 Malignant neoplasm of rectosigmoid junction: Secondary | ICD-10-CM | POA: Diagnosis not present

## 2020-05-31 DIAGNOSIS — F0390 Unspecified dementia without behavioral disturbance: Secondary | ICD-10-CM | POA: Diagnosis not present

## 2020-05-31 DIAGNOSIS — K869 Disease of pancreas, unspecified: Secondary | ICD-10-CM | POA: Diagnosis not present

## 2020-05-31 DIAGNOSIS — E041 Nontoxic single thyroid nodule: Secondary | ICD-10-CM | POA: Diagnosis not present

## 2020-06-07 ENCOUNTER — Telehealth: Payer: Self-pay | Admitting: *Deleted

## 2020-06-07 NOTE — Telephone Encounter (Signed)
Wife called reporting that [facility] did the PET scan and that they want patient to have an [place] of his thyroid. She is asking if Dr Tasia Catchings would order this to be done at [facility] so they don't have to travel to [facility]. Please advise

## 2020-06-08 NOTE — Telephone Encounter (Signed)
Wife calling again about his Korea being done at [facility]. Please return her call

## 2020-06-09 ENCOUNTER — Other Ambulatory Visit: Payer: Self-pay

## 2020-06-09 ENCOUNTER — Inpatient Hospital Stay: Payer: Medicare HMO | Attending: Oncology

## 2020-06-09 VITALS — BP 142/72 | HR 76 | Temp 98.0°F | Resp 18

## 2020-06-09 DIAGNOSIS — D3A8 Other benign neuroendocrine tumors: Secondary | ICD-10-CM

## 2020-06-09 DIAGNOSIS — C7A098 Malignant carcinoid tumors of other sites: Secondary | ICD-10-CM | POA: Insufficient documentation

## 2020-06-09 DIAGNOSIS — Z85038 Personal history of other malignant neoplasm of large intestine: Secondary | ICD-10-CM | POA: Insufficient documentation

## 2020-06-09 DIAGNOSIS — D696 Thrombocytopenia, unspecified: Secondary | ICD-10-CM | POA: Insufficient documentation

## 2020-06-09 DIAGNOSIS — D509 Iron deficiency anemia, unspecified: Secondary | ICD-10-CM | POA: Insufficient documentation

## 2020-06-09 DIAGNOSIS — D500 Iron deficiency anemia secondary to blood loss (chronic): Secondary | ICD-10-CM

## 2020-06-09 MED ORDER — SODIUM CHLORIDE 0.9 % IV SOLN
Freq: Once | INTRAVENOUS | Status: AC
  Filled 2020-06-09: qty 250

## 2020-06-09 MED ORDER — IRON SUCROSE 20 MG/ML IV SOLN
200.0000 mg | Freq: Once | INTRAVENOUS | Status: AC
  Administered 2020-06-09: 200 mg via INTRAVENOUS
  Filled 2020-06-09: qty 10

## 2020-06-09 NOTE — Telephone Encounter (Signed)
There is a note in Care Everywhere stating order for [place] from Dr. Alwyn Pea office was faxed.  The order was not received so I called Dr. Alwyn Pea office for order to be faxed to [phone].

## 2020-06-09 NOTE — Telephone Encounter (Signed)
Per [place] message from [place], [place] has been scheduled for an [place] as requested. Called and made his wife aware of the sched appt and also made her aware that he was sched for Venofer today @ 130.. they forgot and thought Dr. Tasia Catchings told him he didn't need it right now .Marland Kitchenso I went and asked kim, ok for pt to still come if he can be here before 230.[name]"

## 2020-06-09 NOTE — Telephone Encounter (Signed)
Please schedule Korea and notify patient of appt details.  Order received and has been entered into Epic.

## 2020-06-16 ENCOUNTER — Other Ambulatory Visit: Payer: Self-pay | Admitting: Oncology

## 2020-06-16 DIAGNOSIS — D3A8 Other benign neuroendocrine tumors: Secondary | ICD-10-CM

## 2020-06-16 DIAGNOSIS — E041 Nontoxic single thyroid nodule: Secondary | ICD-10-CM

## 2020-06-17 ENCOUNTER — Ambulatory Visit
Admission: RE | Admit: 2020-06-17 | Discharge: 2020-06-17 | Disposition: A | Discharge status: Home / Self Care | Payer: Medicare HMO | Source: Ambulatory Visit | Attending: Oncology | Admitting: Oncology

## 2020-06-17 ENCOUNTER — Other Ambulatory Visit: Payer: Self-pay

## 2020-06-17 DIAGNOSIS — E041 Nontoxic single thyroid nodule: Secondary | ICD-10-CM | POA: Diagnosis not present

## 2020-06-17 DIAGNOSIS — D3A8 Other benign neuroendocrine tumors: Secondary | ICD-10-CM | POA: Diagnosis not present

## 2020-06-17 DIAGNOSIS — E079 Disorder of thyroid, unspecified: Secondary | ICD-10-CM | POA: Diagnosis not present

## 2020-06-17 DIAGNOSIS — E042 Nontoxic multinodular goiter: Secondary | ICD-10-CM | POA: Diagnosis not present

## 2020-06-27 ENCOUNTER — Telehealth: Payer: Self-pay | Admitting: *Deleted

## 2020-06-27 NOTE — Telephone Encounter (Signed)
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   Electronically Signed   By: Corrie Mckusick D.O.   On: 06/18/2020 12:02

## 2020-06-28 NOTE — Telephone Encounter (Signed)
I returned call and spoke with patient and advised that he needs to call Dr Mariah Milling at [facility] for results since they are the one who ordered this test. Patient repeated back so I need to call her? I replied yes sir. He then said OK, Thank you

## 2020-06-28 NOTE — Telephone Encounter (Signed)
K[place] was ordered by Dr. Mariah Milling at [facility].  They will need to call his office for results.

## 2020-06-29 DIAGNOSIS — E538 Deficiency of other specified B group vitamins: Secondary | ICD-10-CM | POA: Diagnosis not present

## 2020-07-19 DIAGNOSIS — E139 Other specified diabetes mellitus without complications: Secondary | ICD-10-CM | POA: Diagnosis not present

## 2020-07-19 DIAGNOSIS — D3A8 Other benign neuroendocrine tumors: Secondary | ICD-10-CM | POA: Diagnosis not present

## 2020-07-19 DIAGNOSIS — A0471 Enterocolitis due to Clostridium difficile, recurrent: Secondary | ICD-10-CM | POA: Diagnosis not present

## 2020-07-19 DIAGNOSIS — Z85038 Personal history of other malignant neoplasm of large intestine: Secondary | ICD-10-CM | POA: Diagnosis not present

## 2020-07-19 DIAGNOSIS — Z86010 Personal history of colonic polyps: Secondary | ICD-10-CM | POA: Diagnosis not present

## 2020-07-19 DIAGNOSIS — F0280 Dementia in other diseases classified elsewhere without behavioral disturbance: Secondary | ICD-10-CM | POA: Diagnosis not present

## 2020-07-19 DIAGNOSIS — G301 Alzheimer's disease with late onset: Secondary | ICD-10-CM | POA: Diagnosis not present

## 2020-07-25 ENCOUNTER — Other Ambulatory Visit: Payer: Self-pay

## 2020-07-25 ENCOUNTER — Other Ambulatory Visit
Admission: RE | Admit: 2020-07-25 | Discharge: 2020-07-25 | Disposition: A | Discharge status: Home / Self Care | Payer: Medicare HMO | Source: Ambulatory Visit | Attending: Internal Medicine | Admitting: Internal Medicine

## 2020-07-25 DIAGNOSIS — Z20822 Contact with and (suspected) exposure to covid-19: Secondary | ICD-10-CM | POA: Diagnosis not present

## 2020-07-25 DIAGNOSIS — Z01812 Encounter for preprocedural laboratory examination: Secondary | ICD-10-CM | POA: Diagnosis not present

## 2020-07-25 LAB — SARS CORONAVIRUS 2 (TAT 6-24 HRS): SARS Coronavirus 2: NEGATIVE

## 2020-07-27 ENCOUNTER — Encounter: Payer: Self-pay | Admitting: Internal Medicine

## 2020-07-27 ENCOUNTER — Ambulatory Visit: Payer: Medicare HMO | Admitting: Anesthesiology

## 2020-07-27 ENCOUNTER — Ambulatory Visit
Admission: RE | Admit: 2020-07-27 | Discharge: 2020-07-27 | Disposition: A | Discharge status: Home / Self Care | Payer: Medicare HMO | Attending: Internal Medicine | Admitting: Internal Medicine

## 2020-07-27 ENCOUNTER — Other Ambulatory Visit: Payer: Self-pay

## 2020-07-27 ENCOUNTER — Encounter
Admission: RE | Disposition: A | Discharge status: Home / Self Care | Payer: Self-pay | Source: Home / Self Care | Attending: Internal Medicine

## 2020-07-27 DIAGNOSIS — K5790 Diverticulosis of intestine, part unspecified, without perforation or abscess without bleeding: Secondary | ICD-10-CM | POA: Diagnosis not present

## 2020-07-27 DIAGNOSIS — M1990 Unspecified osteoarthritis, unspecified site: Secondary | ICD-10-CM | POA: Diagnosis not present

## 2020-07-27 DIAGNOSIS — Z1211 Encounter for screening for malignant neoplasm of colon: Secondary | ICD-10-CM | POA: Insufficient documentation

## 2020-07-27 DIAGNOSIS — Z79899 Other long term (current) drug therapy: Secondary | ICD-10-CM | POA: Insufficient documentation

## 2020-07-27 DIAGNOSIS — Z980 Intestinal bypass and anastomosis status: Secondary | ICD-10-CM | POA: Diagnosis not present

## 2020-07-27 DIAGNOSIS — Z794 Long term (current) use of insulin: Secondary | ICD-10-CM | POA: Diagnosis not present

## 2020-07-27 DIAGNOSIS — K219 Gastro-esophageal reflux disease without esophagitis: Secondary | ICD-10-CM | POA: Insufficient documentation

## 2020-07-27 DIAGNOSIS — F0390 Unspecified dementia without behavioral disturbance: Secondary | ICD-10-CM | POA: Diagnosis not present

## 2020-07-27 DIAGNOSIS — E785 Hyperlipidemia, unspecified: Secondary | ICD-10-CM | POA: Insufficient documentation

## 2020-07-27 DIAGNOSIS — Z85038 Personal history of other malignant neoplasm of large intestine: Secondary | ICD-10-CM | POA: Insufficient documentation

## 2020-07-27 DIAGNOSIS — I252 Old myocardial infarction: Secondary | ICD-10-CM | POA: Insufficient documentation

## 2020-07-27 DIAGNOSIS — Z7982 Long term (current) use of aspirin: Secondary | ICD-10-CM | POA: Diagnosis not present

## 2020-07-27 DIAGNOSIS — E119 Type 2 diabetes mellitus without complications: Secondary | ICD-10-CM | POA: Insufficient documentation

## 2020-07-27 DIAGNOSIS — K5730 Diverticulosis of large intestine without perforation or abscess without bleeding: Secondary | ICD-10-CM | POA: Insufficient documentation

## 2020-07-27 DIAGNOSIS — I10 Essential (primary) hypertension: Secondary | ICD-10-CM | POA: Insufficient documentation

## 2020-07-27 DIAGNOSIS — K648 Other hemorrhoids: Secondary | ICD-10-CM | POA: Diagnosis not present

## 2020-07-27 DIAGNOSIS — Z9049 Acquired absence of other specified parts of digestive tract: Secondary | ICD-10-CM | POA: Insufficient documentation

## 2020-07-27 DIAGNOSIS — I2510 Atherosclerotic heart disease of native coronary artery without angina pectoris: Secondary | ICD-10-CM | POA: Insufficient documentation

## 2020-07-27 DIAGNOSIS — D509 Iron deficiency anemia, unspecified: Secondary | ICD-10-CM | POA: Diagnosis not present

## 2020-07-27 DIAGNOSIS — K640 First degree hemorrhoids: Secondary | ICD-10-CM | POA: Insufficient documentation

## 2020-07-27 HISTORY — PX: COLONOSCOPY WITH PROPOFOL: SHX5780

## 2020-07-27 LAB — GLUCOSE, CAPILLARY: Glucose-Capillary: 99 mg/dL (ref 70–99)

## 2020-07-27 SURGERY — COLONOSCOPY WITH PROPOFOL
Anesthesia: General

## 2020-07-27 MED ORDER — PROPOFOL 500 MG/50ML IV EMUL
INTRAVENOUS | Status: AC
  Filled 2020-07-27: qty 50

## 2020-07-27 MED ORDER — SODIUM CHLORIDE 0.9 % IV SOLN: INTRAVENOUS | Status: DC

## 2020-07-27 MED ORDER — PROPOFOL 500 MG/50ML IV EMUL
INTRAVENOUS | Status: DC | PRN
  Administered 2020-07-27: 120 ug/kg/min via INTRAVENOUS

## 2020-07-27 MED ORDER — PHENYLEPHRINE HCL (PRESSORS) 10 MG/ML IV SOLN
INTRAVENOUS | Status: DC | PRN
  Administered 2020-07-27: 80 ug via INTRAVENOUS
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
## 2020-07-27 NOTE — Anesthesia Postprocedure Evaluation (Signed)
Anesthesia Post Note  Patient: Daniel Sherman  Procedure(s) Performed: COLONOSCOPY WITH PROPOFOL (N/A )  Patient location during evaluation: Endoscopy Anesthesia Type: General Level of consciousness: awake and alert Pain management: pain level controlled Vital Signs Assessment: post-procedure vital signs reviewed and stable Respiratory status: spontaneous breathing, nonlabored ventilation, respiratory function stable and patient connected to nasal cannula oxygen Cardiovascular status: blood pressure returned to baseline and stable Postop Assessment: no apparent nausea or vomiting Anesthetic complications: no   No complications documented.   Last Vitals:  Vitals:   07/27/20 1357 07/27/20 1407  BP: 123/63 126/71  Pulse: 83 76  Resp: (!) 22 (!) 21  Temp:    SpO2: 96% 99%    Last Pain:  Vitals:   07/27/20 1407  TempSrc:   PainSc: 0-No pain                 Arita Miss

## 2020-07-27 NOTE — Transfer of Care (Signed)
Immediate Anesthesia Transfer of Care Note  Patient: Daniel Sherman  Procedure(s) Performed: COLONOSCOPY WITH PROPOFOL (N/A )  Patient Location: PACU and Endoscopy Unit  Anesthesia Type:General  Level of Consciousness: drowsy  Airway & Oxygen Therapy: Patient Spontanous Breathing  Post-op Assessment: Report given to RN and Post -op Vital signs reviewed and stable  Post vital signs: Reviewed and stable  Last Vitals: see nursing VS flow sheet Vitals Value Taken Time  BP    Temp    Pulse    Resp    SpO2      Last Pain:  Vitals:   07/27/20 1209  TempSrc: Temporal  PainSc: 0-No pain         Complications: No complications documented.

## 2020-07-27 NOTE — Interval H&P Note (Signed)
History and Physical Interval Note:  07/27/2020 12:36 PM  Daniel Sherman  has presented today for surgery, with the diagnosis of hx colon cancer.  The various methods of treatment have been discussed with the patient and family. After consideration of risks, benefits and other options for treatment, the patient has consented to  Procedure(s): COLONOSCOPY WITH PROPOFOL (N/A) as a surgical intervention.  The patient's history has been reviewed, patient examined, no change in status, stable for surgery.  I have reviewed the patient's chart and labs.  Questions were answered to the patient's satisfaction.     [place], [place]

## 2020-07-27 NOTE — H&P (Signed)
Outpatient short stay form Pre-procedure 07/27/2020 12:28 PM Daniel Sherman K. [name], M.D.  Primary Physician: [name], M.D.  Reason for visit:Personal history of colon cancer, Aug 2020 s/p right hemicolectomy.  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 Patient denies change in bowel habits, rectal bleeding, weight loss or abdominal pain.      Current Facility-Administered Medications:  .  0.9 %  sodium chloride infusion, , Intravenous, Continuous, [name], [name], MD, Last Rate: 20 mL/hr at 07/27/20 1223, New Bag at 07/27/20 1223  Medications Prior to Admission  Medication Sig Dispense Refill Last Dose  . amitriptyline (ELAVIL) 25 MG tablet Take 25 mg by mouth every evening.    07/26/2020 at Unknown time  . aspirin 81 MG tablet Take 40.5 mg by mouth daily.    07/26/2020 at Unknown time  . carboxymethylcellulose (REFRESH PLUS) 0.5 % SOLN Place 1 drop into both eyes 2 (two) times daily as needed (dry eyes).   07/26/2020 at Unknown time  . Cyanocobalamin (VITAMIN B-12 IJ) Inject 1,000 mcg as directed every 30 (thirty) days.    07/26/2020 at Unknown time  . donepezil (ARICEPT) 5 MG tablet Take 5 mg by mouth at bedtime.   07/26/2020 at Unknown time  . ferrous sulfate 325 (65 FE) MG EC tablet Take 1 tablet (325 mg total) by mouth 2 (two) times daily with a meal. 60 tablet 3 07/26/2020 at Unknown time  . hydrochlorothiazide (HYDRODIURIL) 12.5 MG tablet Take 12.5 mg by mouth every evening.   07/26/2020 at Unknown time  . Insulin NPH Isophane & Regular (HUMULIN 70/30 Alice) Inject 10 Units into the skin 2 (two) times daily.    07/26/2020 at Unknown time  . lisinopril (PRINIVIL,ZESTRIL) 40 MG tablet Take 40 mg by mouth every evening.   07/26/2020 at Unknown time  . memantine (NAMENDA) 10 MG tablet Take 10 mg by mouth  daily.   07/26/2020 at Unknown time  . metFORMIN (GLUCOPHAGE) 1000 MG tablet Take 1,000 mg by mouth 2 (two) times daily.   07/26/2020 at Unknown time  . omeprazole (PRILOSEC) 40 MG capsule Take 40 mg by mouth daily.    07/26/2020 at Unknown time  . simvastatin (ZOCOR) 40 MG tablet Take 40 mg by mouth every evening.   07/26/2020 at Unknown time  . amoxicillin (AMOXIL) 500 MG tablet Take by mouth 2 (two) times daily. Unsure of dose.  Taking TID (Patient not taking: Reported on 05/30/2020)   Not Taking at Unknown time  . vancomycin (VANCOCIN) 125 MG capsule Take 1 capsule (125 mg total) by mouth 4 (four) times daily. (Patient not taking: Reported on 07/27/2020) 40 capsule 0 Not Taking at Unknown time     Allergies  Allergen Reactions  . Other     BLOOD PRODUCT REFUSAL     Past Medical History:  Diagnosis Date  . Arthritis   . Coronary artery disease   . Diabetes mellitus without complication ([place])   . GERD (gastroesophageal reflux disease)   . Headache    migraines in past - none since started amitriptyline (20 yrs)  . Hyperlipidemia   . Hypertension   . IDA (iron deficiency anemia) 02/22/2020  . Memory deficit   . 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 PONV (postoperative nausea and vomiting)    "when they used to use  gas"  . Wears dentures    full upper and lower    Review of systems:  Otherwise negative.    Physical Exam  Gen: Alert, oriented. Appears stated age.  HEENT: [place]/AT. PERRLA. Lungs: CTA, no wheezes. CV: RR nl S1, S2. Abd: soft, benign, no masses. BS+ Ext: No edema. Pulses 2+    Planned procedures: Proceed with colonoscopy. The patient understands the nature of the planned procedure, indications, risks, alternatives and potential complications including but not limited to bleeding, infection, perforation, damage to internal organs and possible oversedation/side effects from anesthesia. The patient agrees and gives consent to proceed.  Please refer to  procedure notes for findings, recommendations and patient disposition/instructions.     [name], M.D. Gastroenterology 07/27/2020  12:28 PM

## 2020-07-27 NOTE — Op Note (Signed)
[facility] Patient Name: Daniel Sherman Procedure Date: 07/27/2020 1:04 PM MRN: [id number] Account #: [id number] Date of Birth: August 26, 1941 Admit Type: Outpatient Age: 79 Room: [facility] ROOM 3 Gender: Male Note Status: Finalized Procedure:             Colonoscopy Indications:           High risk colon cancer surveillance: Personal history                         of colon cancer Providers:             [name]. [name] MD, MD Referring MD:          Irven Easterly. Kary Kos, MD (Referring MD) Medicines:             Propofol per Anesthesia Complications:         No immediate complications. Procedure:             Pre-Anesthesia Assessment:                        - The risks and benefits of the procedure and the                         sedation options and risks were discussed with the                         patient. All questions were answered and informed                         consent was obtained.                        - Patient identification and proposed procedure were                         verified prior to the procedure by the nurse. The                         procedure was verified in the procedure room.                        - ASA Grade Assessment: III - A patient with severe                         systemic disease.                        - After reviewing the risks and benefits, the patient                         was deemed in satisfactory condition to undergo the                         procedure.                        After obtaining informed consent, the colonoscope was                         passed under direct vision. Throughout the  procedure,                         the patient's blood pressure, pulse, and oxygen                         saturations were monitored continuously. The                         Colonoscope was introduced through the anus and                         advanced to the the ileocolonic anastomosis. The                          colonoscopy was performed without difficulty. The                         patient tolerated the procedure well. The quality of                         the bowel preparation was good. Ileocolonic                         anastomosis were photographed. Findings:      The perianal and digital rectal examinations were normal. Pertinent       negatives include normal sphincter tone and no palpable rectal lesions.      Non-bleeding internal hemorrhoids were found during retroflexion. The       hemorrhoids were Grade I (internal hemorrhoids that do not prolapse).      Many small-mouthed diverticula were found in the entire colon.      There was evidence of a prior end-to-side ileo-colonic anastomosis in       the transverse colon. This was patent and was characterized by healthy       appearing mucosa. The anastomosis was traversed.      The neo-terminal ileum appeared normal.      The exam was otherwise without abnormality. Impression:            - Non-bleeding internal hemorrhoids.                        - Diverticulosis in the entire examined colon.                        - Patent end-to-side ileo-colonic anastomosis,                         characterized by healthy appearing mucosa.                        - The examined portion of the ileum was normal.                        - The examination was otherwise normal.                        - No specimens collected. Recommendation:        - Patient has a contact number available for  emergencies. The signs and symptoms of potential                         delayed complications were discussed with the patient.                         Return to normal activities tomorrow. Written                         discharge instructions were provided to the patient.                        - Resume previous diet.                        - Continue present medications.                        - Repeat colonoscopy in 1 year  for surveillance.                        - Return to GI office PRN.                        - The findings and recommendations were discussed with                         the patient. Procedure Code(s):     --- Professional ---                        S0109, Colorectal cancer screening; colonoscopy on                         individual at high risk Diagnosis Code(s):     --- Professional ---                        K57.30, Diverticulosis of large intestine without                         perforation or abscess without bleeding                        Z98.0, Intestinal bypass and anastomosis status                        K64.0, First degree hemorrhoids                        Z85.038, Personal history of other malignant neoplasm                         of large intestine CPT copyright 2019 American Medical Association. All rights reserved. The codes documented in this report are preliminary and upon coder review may  be revised to meet current compliance requirements. Efrain Sella MD, MD 07/27/2020 1:47:21 PM This report has been signed electronically. Number of Addenda: 0 Note Initiated On: 07/27/2020 1:04 PM Scope Withdrawal Time: 0 hours 4 minutes 28 seconds  Total Procedure Duration: 0 hours 8 minutes 40 seconds  Estimated Blood Loss:  Estimated blood loss: none.  [facility]

## 2020-07-28 ENCOUNTER — Encounter: Payer: Self-pay | Admitting: Internal Medicine

## 2020-08-31 ENCOUNTER — Other Ambulatory Visit: Payer: Self-pay

## 2020-08-31 ENCOUNTER — Inpatient Hospital Stay: Payer: Medicare HMO | Attending: Oncology

## 2020-08-31 DIAGNOSIS — E119 Type 2 diabetes mellitus without complications: Secondary | ICD-10-CM | POA: Insufficient documentation

## 2020-08-31 DIAGNOSIS — Z79899 Other long term (current) drug therapy: Secondary | ICD-10-CM | POA: Insufficient documentation

## 2020-08-31 DIAGNOSIS — Z85038 Personal history of other malignant neoplasm of large intestine: Secondary | ICD-10-CM | POA: Diagnosis not present

## 2020-08-31 DIAGNOSIS — I10 Essential (primary) hypertension: Secondary | ICD-10-CM | POA: Insufficient documentation

## 2020-08-31 DIAGNOSIS — Z794 Long term (current) use of insulin: Secondary | ICD-10-CM | POA: Insufficient documentation

## 2020-08-31 DIAGNOSIS — C180 Malignant neoplasm of cecum: Secondary | ICD-10-CM

## 2020-08-31 DIAGNOSIS — D500 Iron deficiency anemia secondary to blood loss (chronic): Secondary | ICD-10-CM | POA: Diagnosis not present

## 2020-08-31 DIAGNOSIS — C7A098 Malignant carcinoid tumors of other sites: Secondary | ICD-10-CM | POA: Diagnosis not present

## 2020-08-31 DIAGNOSIS — I252 Old myocardial infarction: Secondary | ICD-10-CM | POA: Insufficient documentation

## 2020-08-31 DIAGNOSIS — Z7982 Long term (current) use of aspirin: Secondary | ICD-10-CM | POA: Diagnosis not present

## 2020-08-31 DIAGNOSIS — D696 Thrombocytopenia, unspecified: Secondary | ICD-10-CM | POA: Insufficient documentation

## 2020-08-31 LAB — CBC WITH DIFFERENTIAL/PLATELET
Abs Immature Granulocytes: 0.03 10*3/uL (ref 0.00–0.07)
Basophils Absolute: 0 10*3/uL (ref 0.0–0.1)
Basophils Relative: 1 %
Eosinophils Absolute: 0.3 10*3/uL (ref 0.0–0.5)
Eosinophils Relative: 7 %
HCT: 37.9 % — ABNORMAL LOW (ref 39.0–52.0)
Hemoglobin: 13.2 g/dL (ref 13.0–17.0)
Immature Granulocytes: 1 %
Lymphocytes Relative: 26 %
Lymphs Abs: 1.1 10*3/uL (ref 0.7–4.0)
MCH: 28.1 pg (ref 26.0–34.0)
MCHC: 34.8 g/dL (ref 30.0–36.0)
MCV: 80.8 fL (ref 80.0–100.0)
Monocytes Absolute: 0.4 10*3/uL (ref 0.1–1.0)
Monocytes Relative: 8 %
Neutro Abs: 2.6 10*3/uL (ref 1.7–7.7)
Neutrophils Relative %: 57 %
Platelets: 99 10*3/uL — ABNORMAL LOW (ref 150–400)
RBC: 4.69 MIL/uL (ref 4.22–5.81)
RDW: 13.2 % (ref 11.5–15.5)
WBC: 4.4 10*3/uL (ref 4.0–10.5)
nRBC: 0 % (ref 0.0–0.2)

## 2020-08-31 LAB — COMPREHENSIVE METABOLIC PANEL
ALT: 21 U/L (ref 0–44)
AST: 17 U/L (ref 15–41)
Albumin: 4.4 g/dL (ref 3.5–5.0)
Alkaline Phosphatase: 66 U/L (ref 38–126)
Anion gap: 11 (ref 5–15)
BUN: 24 mg/dL — ABNORMAL HIGH (ref 8–23)
CO2: 27 mmol/L (ref 22–32)
Calcium: 8.9 mg/dL (ref 8.9–10.3)
Chloride: 98 mmol/L (ref 98–111)
Creatinine, Ser: 1.69 mg/dL — ABNORMAL HIGH (ref 0.61–1.24)
GFR calc Af Amer: 44 mL/min — ABNORMAL LOW (ref >60)
GFR calc non Af Amer: 38 mL/min — ABNORMAL LOW (ref >60)
Glucose, Bld: 149 mg/dL — ABNORMAL HIGH (ref 70–99)
Potassium: 4.7 mmol/L (ref 3.5–5.1)
Sodium: 136 mmol/L (ref 135–145)
Total Bilirubin: 0.9 mg/dL (ref 0.3–1.2)
Total Protein: 7.5 g/dL (ref 6.5–8.1)

## 2020-08-31 LAB — IRON AND TIBC
Iron: 66 ug/dL (ref 45–182)
Saturation Ratios: 18 % (ref 17.9–39.5)
TIBC: 368 ug/dL (ref 250–450)
UIBC: 302 ug/dL

## 2020-08-31 LAB — FERRITIN: Ferritin: 46 ng/mL (ref 24–336)

## 2020-09-01 LAB — CEA: CEA: 1.3 ng/mL (ref 0.0–4.7)

## 2020-09-02 ENCOUNTER — Other Ambulatory Visit: Payer: Self-pay

## 2020-09-02 ENCOUNTER — Encounter: Payer: Self-pay | Admitting: Oncology

## 2020-09-02 ENCOUNTER — Inpatient Hospital Stay: Payer: Medicare HMO | Attending: Oncology | Admitting: Oncology

## 2020-09-02 ENCOUNTER — Inpatient Hospital Stay: Payer: Medicare HMO

## 2020-09-02 VITALS — BP 145/78 | HR 73 | Temp 98.1°F | Resp 16 | Wt 207.4 lb

## 2020-09-02 DIAGNOSIS — D500 Iron deficiency anemia secondary to blood loss (chronic): Secondary | ICD-10-CM | POA: Diagnosis not present

## 2020-09-02 DIAGNOSIS — Z8249 Family history of ischemic heart disease and other diseases of the circulatory system: Secondary | ICD-10-CM | POA: Diagnosis not present

## 2020-09-02 DIAGNOSIS — I2510 Atherosclerotic heart disease of native coronary artery without angina pectoris: Secondary | ICD-10-CM | POA: Insufficient documentation

## 2020-09-02 DIAGNOSIS — Z794 Long term (current) use of insulin: Secondary | ICD-10-CM | POA: Diagnosis not present

## 2020-09-02 DIAGNOSIS — D3A8 Other benign neuroendocrine tumors: Secondary | ICD-10-CM

## 2020-09-02 DIAGNOSIS — E785 Hyperlipidemia, unspecified: Secondary | ICD-10-CM | POA: Diagnosis not present

## 2020-09-02 DIAGNOSIS — D72810 Lymphocytopenia: Secondary | ICD-10-CM | POA: Insufficient documentation

## 2020-09-02 DIAGNOSIS — Z85038 Personal history of other malignant neoplasm of large intestine: Secondary | ICD-10-CM | POA: Insufficient documentation

## 2020-09-02 DIAGNOSIS — G309 Alzheimer's disease, unspecified: Secondary | ICD-10-CM | POA: Insufficient documentation

## 2020-09-02 DIAGNOSIS — C180 Malignant neoplasm of cecum: Secondary | ICD-10-CM

## 2020-09-02 DIAGNOSIS — I10 Essential (primary) hypertension: Secondary | ICD-10-CM | POA: Diagnosis not present

## 2020-09-02 DIAGNOSIS — C7A098 Malignant carcinoid tumors of other sites: Secondary | ICD-10-CM | POA: Insufficient documentation

## 2020-09-02 DIAGNOSIS — D696 Thrombocytopenia, unspecified: Secondary | ICD-10-CM | POA: Diagnosis not present

## 2020-09-02 DIAGNOSIS — F0280 Dementia in other diseases classified elsewhere without behavioral disturbance: Secondary | ICD-10-CM | POA: Insufficient documentation

## 2020-09-02 DIAGNOSIS — I252 Old myocardial infarction: Secondary | ICD-10-CM | POA: Diagnosis not present

## 2020-09-02 DIAGNOSIS — E119 Type 2 diabetes mellitus without complications: Secondary | ICD-10-CM | POA: Diagnosis not present

## 2020-09-02 DIAGNOSIS — Z7982 Long term (current) use of aspirin: Secondary | ICD-10-CM | POA: Insufficient documentation

## 2020-09-02 DIAGNOSIS — Z79899 Other long term (current) drug therapy: Secondary | ICD-10-CM | POA: Insufficient documentation
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
## 2020-09-02 NOTE — Progress Notes (Signed)
Patient denies new problems/concerns today.   °

## 2020-09-16 ENCOUNTER — Ambulatory Visit
Admission: RE | Admit: 2020-09-16 | Discharge: 2020-09-16 | Disposition: A | Discharge status: Home / Self Care | Payer: Medicare HMO | Source: Ambulatory Visit | Attending: Oncology | Admitting: Oncology

## 2020-09-16 ENCOUNTER — Other Ambulatory Visit: Payer: Self-pay

## 2020-09-16 DIAGNOSIS — I700 Atherosclerosis of aorta: Secondary | ICD-10-CM | POA: Diagnosis not present

## 2020-09-16 DIAGNOSIS — D3A8 Other benign neuroendocrine tumors: Secondary | ICD-10-CM | POA: Insufficient documentation

## 2020-09-16 DIAGNOSIS — K828 Other specified diseases of gallbladder: Secondary | ICD-10-CM | POA: Diagnosis not present

## 2020-09-16 DIAGNOSIS — K5730 Diverticulosis of large intestine without perforation or abscess without bleeding: Secondary | ICD-10-CM | POA: Diagnosis not present

## 2020-09-16 DIAGNOSIS — C189 Malignant neoplasm of colon, unspecified: Secondary | ICD-10-CM | POA: Diagnosis not present

## 2020-09-16 DIAGNOSIS — I2510 Atherosclerotic heart disease of native coronary artery without angina pectoris: Secondary | ICD-10-CM | POA: Diagnosis not present

## 2020-09-16 DIAGNOSIS — K8020 Calculus of gallbladder without cholecystitis without obstruction: Secondary | ICD-10-CM | POA: Diagnosis not present

## 2020-09-26 ENCOUNTER — Telehealth: Payer: Self-pay

## 2020-09-26 NOTE — Telephone Encounter (Signed)
Left message to call for results

## 2020-09-26 NOTE — Telephone Encounter (Signed)
-----   Message from [name], MD sent at 09/25/2020 10:07 PM EDT ----- Please let him know that CT showed stable size of the pancreatic mass. No evidence of colon cancer recurrence. Thanks.

## 2020-09-27 ENCOUNTER — Telehealth: Payer: Self-pay | Admitting: *Deleted

## 2020-09-27 NOTE — Telephone Encounter (Signed)
I have an open phone note encounter where I tried calling yesterday with results.  Will return call today.

## 2020-09-27 NOTE — Telephone Encounter (Signed)
Wife calling for patient results form 09/16/20, Next appointment not until April 2022.  IMPRESSION: 1. Status post right colon resection and reanastomosis. No noncontrast evidence of recurrent or metastatic disease in the chest, abdomen or pelvis. 2. No significant interval change in a mass of the pancreatic tail measuring 2.6 x 2.4 cm. 3. Slight interval decrease in size of a circumscribed fatty attenuation lesion in the right lower quadrant, in keeping with expected evolution of a postoperative omental or mesenteric infarct. 4. Unchanged mild splenomegaly. 5. Cholelithiasis. 6. Coronary artery disease.  Aortic Atherosclerosis (ICD10-I70.0).   Electronically Signed   By: Eddie Candle M.D.   On: 09/16/2020 10:30

## 2020-09-27 NOTE — Telephone Encounter (Signed)
Patient's wife notified of results.  

## 2020-10-07 DIAGNOSIS — Z23 Encounter for immunization: Secondary | ICD-10-CM | POA: Diagnosis not present

## 2020-11-01 DIAGNOSIS — E139 Other specified diabetes mellitus without complications: Secondary | ICD-10-CM | POA: Diagnosis not present

## 2020-11-01 DIAGNOSIS — L538 Other specified erythematous conditions: Secondary | ICD-10-CM | POA: Diagnosis not present

## 2020-11-01 DIAGNOSIS — L820 Inflamed seborrheic keratosis: Secondary | ICD-10-CM | POA: Diagnosis not present

## 2020-11-08 DIAGNOSIS — R413 Other amnesia: Secondary | ICD-10-CM | POA: Diagnosis not present

## 2020-11-08 DIAGNOSIS — Z125 Encounter for screening for malignant neoplasm of prostate: Secondary | ICD-10-CM | POA: Diagnosis not present

## 2020-11-08 DIAGNOSIS — E785 Hyperlipidemia, unspecified: Secondary | ICD-10-CM | POA: Diagnosis not present

## 2020-11-08 DIAGNOSIS — N183 Chronic kidney disease, stage 3 unspecified: Secondary | ICD-10-CM | POA: Diagnosis not present

## 2020-11-08 DIAGNOSIS — I129 Hypertensive chronic kidney disease with stage 1 through stage 4 chronic kidney disease, or unspecified chronic kidney disease: Secondary | ICD-10-CM | POA: Diagnosis not present

## 2020-11-08 DIAGNOSIS — D696 Thrombocytopenia, unspecified: Secondary | ICD-10-CM | POA: Diagnosis not present

## 2020-11-08 DIAGNOSIS — E1022 Type 1 diabetes mellitus with diabetic chronic kidney disease: Secondary | ICD-10-CM | POA: Diagnosis not present

## 2020-11-08 DIAGNOSIS — J302 Other seasonal allergic rhinitis: Secondary | ICD-10-CM | POA: Diagnosis not present

## 2020-12-15 DIAGNOSIS — D23112 Other benign neoplasm of skin of right lower eyelid, including canthus: Secondary | ICD-10-CM | POA: Diagnosis not present

## 2021-01-02 DIAGNOSIS — Z03818 Encounter for observation for suspected exposure to other biological agents ruled out: Secondary | ICD-10-CM | POA: Diagnosis not present

## 2021-01-02 DIAGNOSIS — Z20822 Contact with and (suspected) exposure to covid-19: Secondary | ICD-10-CM | POA: Diagnosis not present

## 2021-01-12 DIAGNOSIS — D23112 Other benign neoplasm of skin of right lower eyelid, including canthus: Secondary | ICD-10-CM | POA: Diagnosis not present

## 2021-01-12 DIAGNOSIS — D23122 Other benign neoplasm of skin of left lower eyelid, including canthus: Secondary | ICD-10-CM | POA: Diagnosis not present

## 2021-01-13 DIAGNOSIS — L988 Other specified disorders of the skin and subcutaneous tissue: Secondary | ICD-10-CM | POA: Diagnosis not present

## 2021-01-13 DIAGNOSIS — L820 Inflamed seborrheic keratosis: Secondary | ICD-10-CM | POA: Diagnosis not present

## 2021-01-13 DIAGNOSIS — D23112 Other benign neoplasm of skin of right lower eyelid, including canthus: Secondary | ICD-10-CM | POA: Diagnosis not present

## 2021-01-13 DIAGNOSIS — D4989 Neoplasm of unspecified behavior of other specified sites: Secondary | ICD-10-CM | POA: Diagnosis not present

## 2021-01-13 DIAGNOSIS — D23122 Other benign neoplasm of skin of left lower eyelid, including canthus: Secondary | ICD-10-CM | POA: Diagnosis not present

## 2021-02-21 DIAGNOSIS — E559 Vitamin D deficiency, unspecified: Secondary | ICD-10-CM | POA: Diagnosis not present

## 2021-02-21 DIAGNOSIS — G309 Alzheimer's disease, unspecified: Secondary | ICD-10-CM | POA: Diagnosis not present

## 2021-02-21 DIAGNOSIS — E519 Thiamine deficiency, unspecified: Secondary | ICD-10-CM | POA: Diagnosis not present

## 2021-02-21 DIAGNOSIS — Z8669 Personal history of other diseases of the nervous system and sense organs: Secondary | ICD-10-CM | POA: Diagnosis not present

## 2021-02-21 DIAGNOSIS — E538 Deficiency of other specified B group vitamins: Secondary | ICD-10-CM | POA: Diagnosis not present

## 2021-02-21 DIAGNOSIS — F0280 Dementia in other diseases classified elsewhere without behavioral disturbance: Secondary | ICD-10-CM | POA: Diagnosis not present

## 2021-02-21 DIAGNOSIS — F0150 Vascular dementia without behavioral disturbance: Secondary | ICD-10-CM | POA: Diagnosis not present

## 2021-03-03 ENCOUNTER — Inpatient Hospital Stay: Payer: Medicare HMO | Attending: Oncology

## 2021-03-03 DIAGNOSIS — D3A8 Other benign neuroendocrine tumors: Secondary | ICD-10-CM

## 2021-03-03 DIAGNOSIS — Z85038 Personal history of other malignant neoplasm of large intestine: Secondary | ICD-10-CM | POA: Diagnosis not present

## 2021-03-03 DIAGNOSIS — E538 Deficiency of other specified B group vitamins: Secondary | ICD-10-CM | POA: Diagnosis not present

## 2021-03-03 DIAGNOSIS — D72810 Lymphocytopenia: Secondary | ICD-10-CM | POA: Diagnosis not present

## 2021-03-03 DIAGNOSIS — D696 Thrombocytopenia, unspecified: Secondary | ICD-10-CM | POA: Insufficient documentation

## 2021-03-03 DIAGNOSIS — D500 Iron deficiency anemia secondary to blood loss (chronic): Secondary | ICD-10-CM | POA: Insufficient documentation

## 2021-03-03 DIAGNOSIS — C7A8 Other malignant neuroendocrine tumors: Secondary | ICD-10-CM | POA: Diagnosis not present

## 2021-03-03 DIAGNOSIS — C180 Malignant neoplasm of cecum: Secondary | ICD-10-CM

## 2021-03-03 LAB — CBC WITH DIFFERENTIAL/PLATELET
Abs Immature Granulocytes: 0.02 10*3/uL (ref 0.00–0.07)
Basophils Absolute: 0 10*3/uL (ref 0.0–0.1)
Basophils Relative: 1 %
Eosinophils Absolute: 0.3 10*3/uL (ref 0.0–0.5)
Eosinophils Relative: 5 %
HCT: 38.5 % — ABNORMAL LOW (ref 39.0–52.0)
Hemoglobin: 13 g/dL (ref 13.0–17.0)
Immature Granulocytes: 0 %
Lymphocytes Relative: 18 %
Lymphs Abs: 0.9 10*3/uL (ref 0.7–4.0)
MCH: 28.7 pg (ref 26.0–34.0)
MCHC: 33.8 g/dL (ref 30.0–36.0)
MCV: 85 fL (ref 80.0–100.0)
Monocytes Absolute: 0.3 10*3/uL (ref 0.1–1.0)
Monocytes Relative: 6 %
Neutro Abs: 3.6 10*3/uL (ref 1.7–7.7)
Neutrophils Relative %: 70 %
Platelets: 109 10*3/uL — ABNORMAL LOW (ref 150–400)
RBC: 4.53 MIL/uL (ref 4.22–5.81)
RDW: 12.3 % (ref 11.5–15.5)
WBC: 5.2 10*3/uL (ref 4.0–10.5)
nRBC: 0 % (ref 0.0–0.2)

## 2021-03-03 LAB — COMPREHENSIVE METABOLIC PANEL
ALT: 16 U/L (ref 0–44)
AST: 19 U/L (ref 15–41)
Albumin: 4.1 g/dL (ref 3.5–5.0)
Alkaline Phosphatase: 66 U/L (ref 38–126)
Anion gap: 8 (ref 5–15)
BUN: 21 mg/dL (ref 8–23)
CO2: 27 mmol/L (ref 22–32)
Calcium: 8.6 mg/dL — ABNORMAL LOW (ref 8.9–10.3)
Chloride: 101 mmol/L (ref 98–111)
Creatinine, Ser: 1.6 mg/dL — ABNORMAL HIGH (ref 0.61–1.24)
GFR, Estimated: 43 mL/min — ABNORMAL LOW (ref >60)
Glucose, Bld: 143 mg/dL — ABNORMAL HIGH (ref 70–99)
Potassium: 4.8 mmol/L (ref 3.5–5.1)
Sodium: 136 mmol/L (ref 135–145)
Total Bilirubin: 0.6 mg/dL (ref 0.3–1.2)
Total Protein: 7.2 g/dL (ref 6.5–8.1)

## 2021-03-03 LAB — IRON AND TIBC
Iron: 50 ug/dL (ref 45–182)
Saturation Ratios: 14 % — ABNORMAL LOW (ref 17.9–39.5)
TIBC: 367 ug/dL (ref 250–450)
UIBC: 317 ug/dL

## 2021-03-03 LAB — FERRITIN: Ferritin: 38 ng/mL (ref 24–336)

## 2021-03-04 LAB — CEA: CEA: 0.6 ng/mL (ref 0.0–4.7)

## 2021-03-06 ENCOUNTER — Inpatient Hospital Stay: Payer: Medicare HMO | Admitting: Oncology

## 2021-03-06 ENCOUNTER — Encounter: Payer: Self-pay | Admitting: Oncology

## 2021-03-06 VITALS — Temp 97.9°F | Wt 202.2 lb

## 2021-03-06 DIAGNOSIS — C7A8 Other malignant neuroendocrine tumors: Secondary | ICD-10-CM | POA: Diagnosis not present

## 2021-03-06 DIAGNOSIS — D72810 Lymphocytopenia: Secondary | ICD-10-CM

## 2021-03-06 DIAGNOSIS — C180 Malignant neoplasm of cecum: Secondary | ICD-10-CM | POA: Diagnosis not present

## 2021-03-06 DIAGNOSIS — Z85038 Personal history of other malignant neoplasm of large intestine: Secondary | ICD-10-CM | POA: Diagnosis not present

## 2021-03-06 DIAGNOSIS — D3A8 Other benign neuroendocrine tumors: Secondary | ICD-10-CM

## 2021-03-06 DIAGNOSIS — D500 Iron deficiency anemia secondary to blood loss (chronic): Secondary | ICD-10-CM

## 2021-03-06 DIAGNOSIS — D696 Thrombocytopenia, unspecified: Secondary | ICD-10-CM
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
## 2021-03-10 DIAGNOSIS — E538 Deficiency of other specified B group vitamins: Secondary | ICD-10-CM | POA: Diagnosis not present

## 2021-03-21 ENCOUNTER — Ambulatory Visit
Admission: RE | Admit: 2021-03-21 | Discharge: 2021-03-21 | Disposition: A | Discharge status: Home / Self Care | Payer: Medicare HMO | Source: Ambulatory Visit | Attending: Oncology | Admitting: Oncology

## 2021-03-21 ENCOUNTER — Other Ambulatory Visit: Payer: Self-pay

## 2021-03-21 DIAGNOSIS — D500 Iron deficiency anemia secondary to blood loss (chronic): Secondary | ICD-10-CM | POA: Diagnosis not present

## 2021-03-21 DIAGNOSIS — D3A8 Other benign neuroendocrine tumors: Secondary | ICD-10-CM | POA: Insufficient documentation

## 2021-03-21 DIAGNOSIS — C189 Malignant neoplasm of colon, unspecified: Secondary | ICD-10-CM | POA: Diagnosis not present

## 2021-03-21 DIAGNOSIS — C180 Malignant neoplasm of cecum: Secondary | ICD-10-CM | POA: Diagnosis not present

## 2021-03-21 DIAGNOSIS — Z86012 Personal history of benign carcinoid tumor: Secondary | ICD-10-CM | POA: Diagnosis not present

## 2021-03-21 DIAGNOSIS — K219 Gastro-esophageal reflux disease without esophagitis: Secondary | ICD-10-CM | POA: Diagnosis not present

## 2021-03-21 DIAGNOSIS — C259 Malignant neoplasm of pancreas, unspecified: Secondary | ICD-10-CM | POA: Diagnosis not present

## 2021-03-21 DIAGNOSIS — I700 Atherosclerosis of aorta: Secondary | ICD-10-CM | POA: Diagnosis not present

## 2021-03-21 DIAGNOSIS — K8020 Calculus of gallbladder without cholecystitis without obstruction: Secondary | ICD-10-CM | POA: Diagnosis not present

## 2021-04-10 ENCOUNTER — Telehealth: Payer: Self-pay

## 2021-04-10 NOTE — Telephone Encounter (Signed)
Patient notified

## 2021-04-10 NOTE — Telephone Encounter (Signed)
-----   Message from [name], MD sent at 04/08/2021  3:09 PM EDT ----- Please let him know that CT results are good thanks.

## 2021-05-03 DIAGNOSIS — E139 Other specified diabetes mellitus without complications: Secondary | ICD-10-CM | POA: Diagnosis not present

## 2021-05-03 DIAGNOSIS — I10 Essential (primary) hypertension: Secondary | ICD-10-CM | POA: Diagnosis not present

## 2021-05-03 DIAGNOSIS — E785 Hyperlipidemia, unspecified: Secondary | ICD-10-CM | POA: Diagnosis not present

## 2021-05-03 DIAGNOSIS — Z125 Encounter for screening for malignant neoplasm of prostate: Secondary | ICD-10-CM | POA: Diagnosis not present

## 2021-05-03 IMAGING — CT CT CHEST-ABD-PELV W/O CM
2 of 4 series · 13 of 36 positions shown, 15 images · non-contrast
Comparison: Multiple priors including most recent CT chest abdomen
and pelvis September 14, 2020

CLINICAL DATA: Follow-up colon cancer, pancreatic neuroendocrine
tumor.

EXAM:
CT CHEST, ABDOMEN AND PELVIS WITHOUT CONTRAST
TECHNIQUE: Multidetector CT imaging of the chest, abdomen and pelvis was
performed following the standard protocol without IV contrast.

[Series 2: axials cap 5.00 · axial · 0.82mm/px · z∈[-1525,-960]mm · 10 of 137 slices shown, 12 images]
[im 12/137  mediastinal]
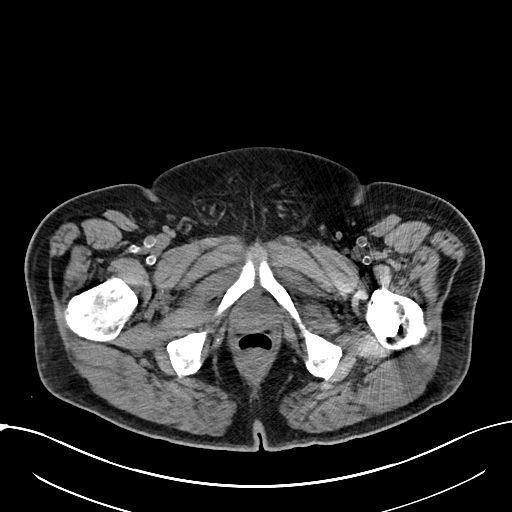
[im 12/137  bone]
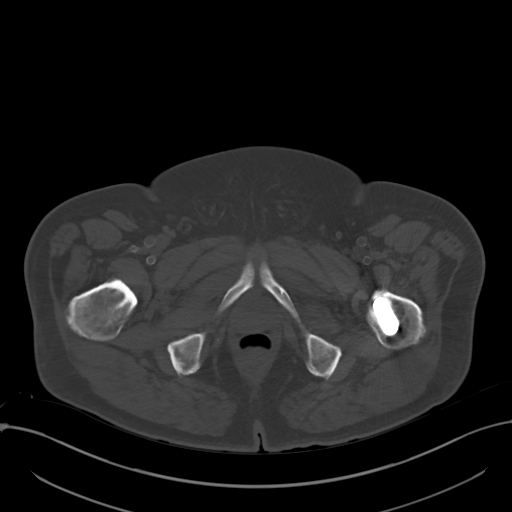
[im 23/137  mediastinal]
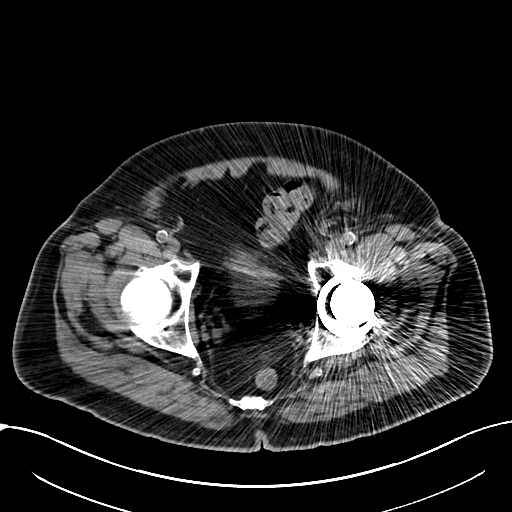
[im 35/137  mediastinal]
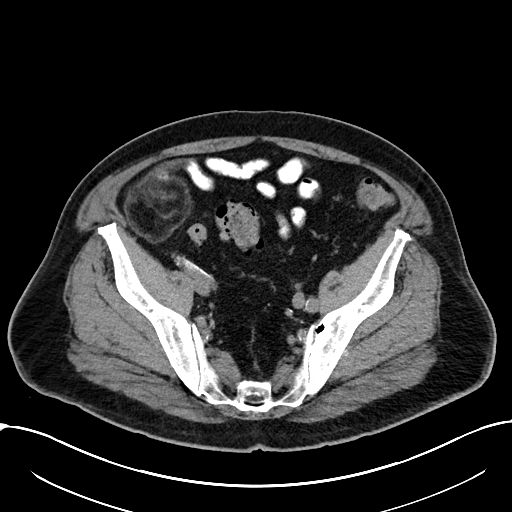
[im 46/137  mediastinal]
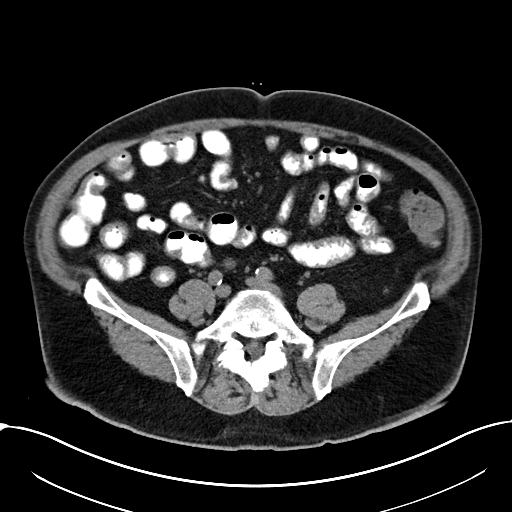
[im 57/137  mediastinal]
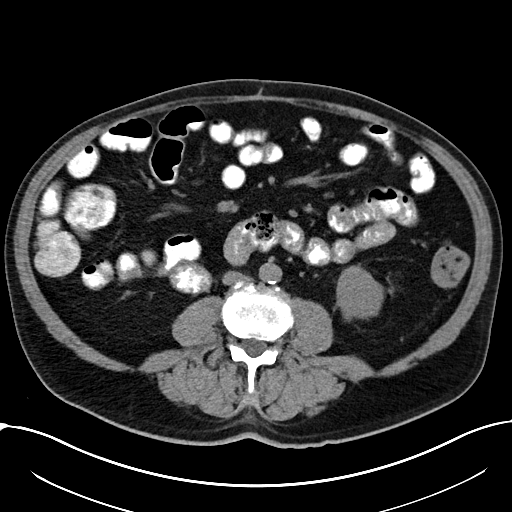
[im 80/137  mediastinal]
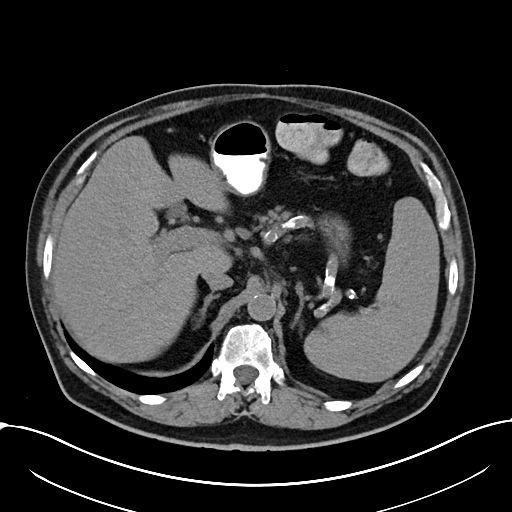
[im 91/137  mediastinal]
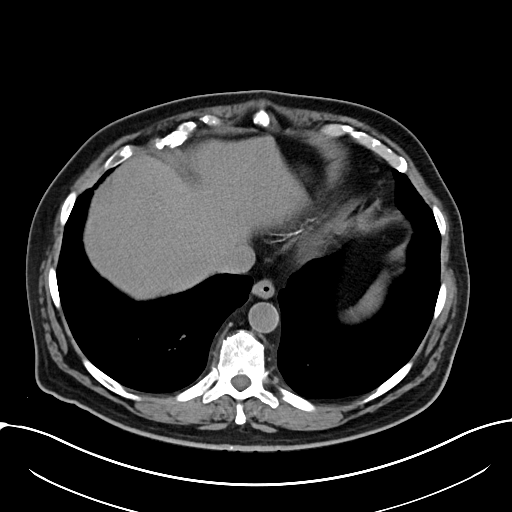
[im 103/137  mediastinal]
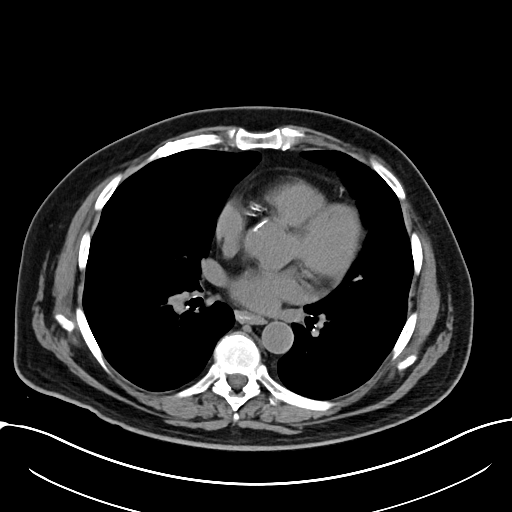
[im 114/137  mediastinal]
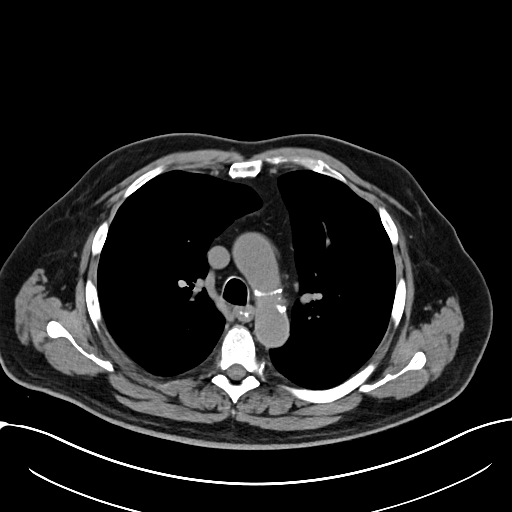
[im 114/137  bone]
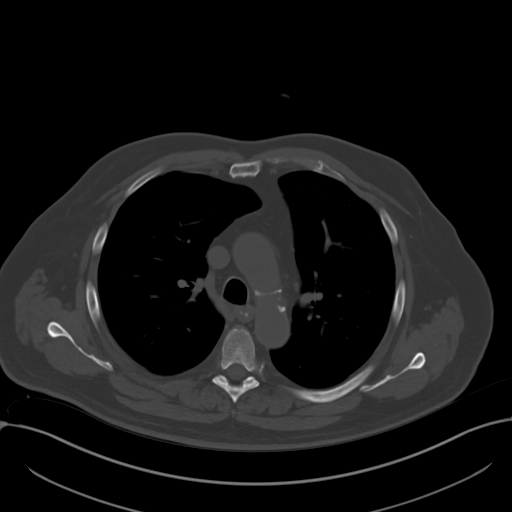
[im 125/137  mediastinal]
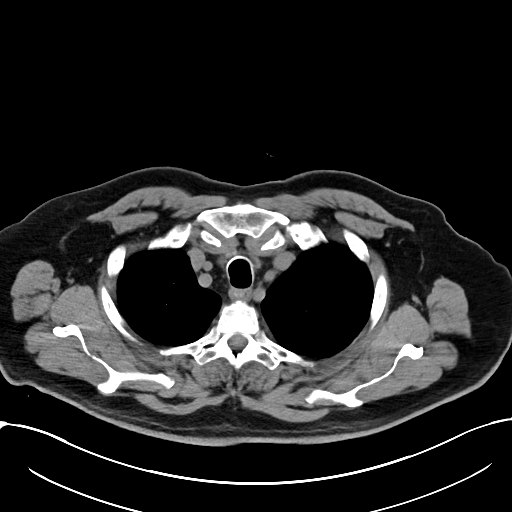

[Series 4: coronals cap 2.00 cor · coronal · 0.82mm/px · 3 of 158 slices shown]
[im 32/158  mediastinal]
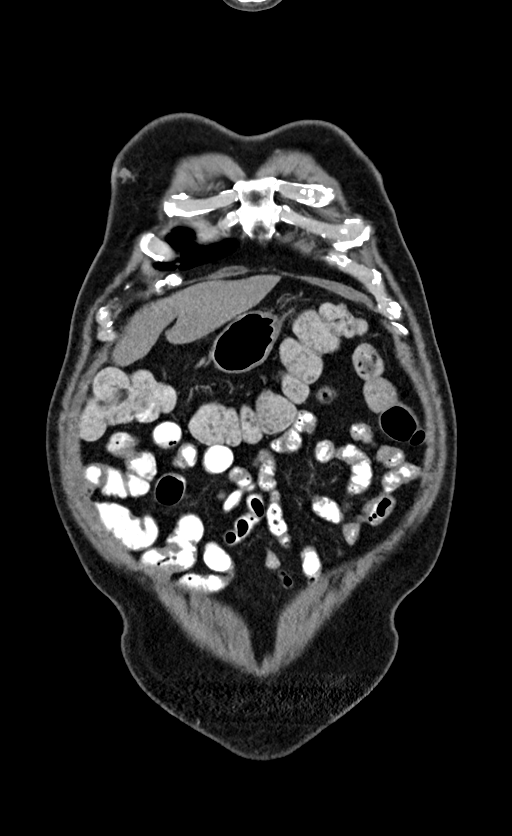
[im 63/158  mediastinal]
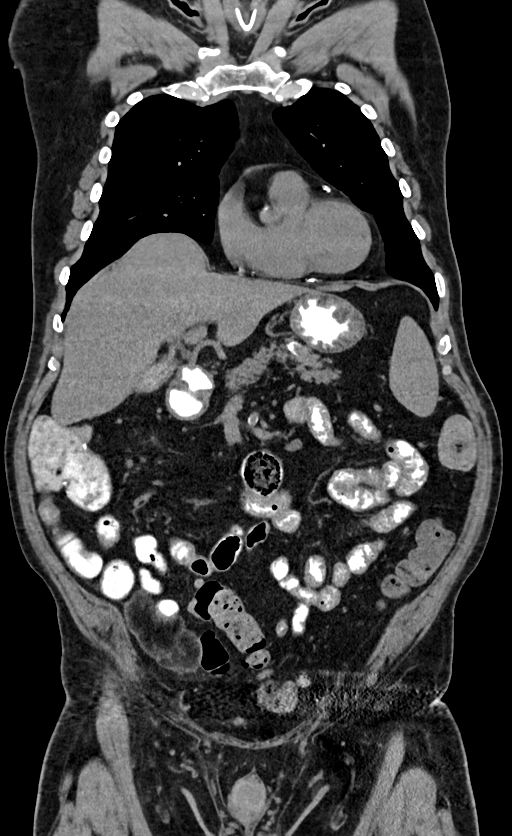
[im 95/158  mediastinal]
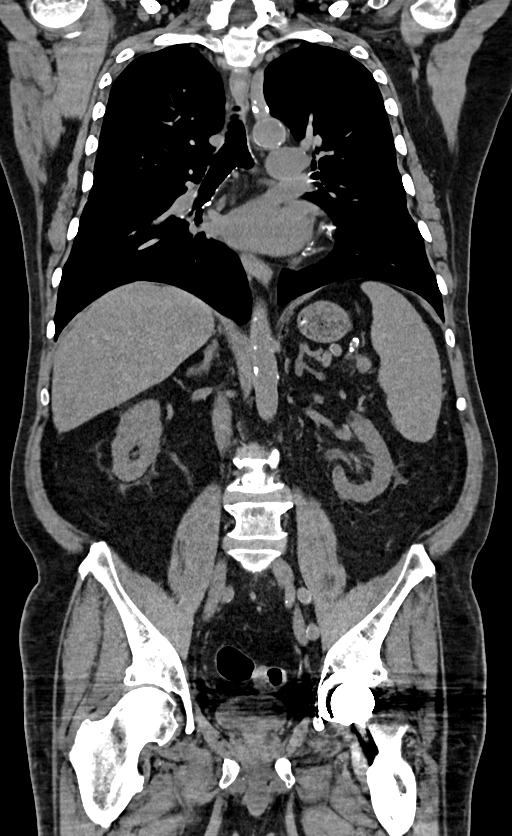

[13 of 36 positions shown; findings below may reference images not displayed]

FINDINGS: CT CHEST FINDINGS

Cardiovascular: Aortic and branch vessel atherosclerosis. No
thoracic aortic aneurysm. Extensive 3 vessel coronary artery
calcifications versus stents. No significant pericardial
effusion/thickening. Normal size heart.

Mediastinum/Nodes: Stable size of the 1.4 cm hypodense right thyroid
nodule. Not clinically significant; no follow-up imaging recommended
(ref: [HOSPITAL]. [DATE]): 143-50).No pathologically
enlarged mediastinal or axillary lymph nodes. Similar distal
esophageal wall thickening with residual/reflux contrast in the
esophagus suggestive of esophagitis with gastroesophageal reflux.
Trachea is unremarkable.

Lungs/Pleura: No suspicious pulmonary nodules or masses. No focal
consolidation. No pleural effusion. No pneumothorax.

Musculoskeletal: No aggressive lytic or blastic lesions of bone.

CT ABDOMEN PELVIS FINDINGS

Hepatobiliary: Unremarkable noncontrast appearance of the hepatic
parenchyma. Layering gallstones without findings of acute
cholecystitis. No biliary ductal dilation.

Pancreas: Unchanged size of the mass in the pancreatic tail
measuring 2.6 by 2.3 cm on image 64/2. No pancreatic ductal dilation
or inflammatory change.

Spleen: Calcified splenic granulomata. Unchanged mild splenomegaly
measuring 14 cm in maximum craniocaudal dimension.

Adrenals/Urinary Tract: Bilateral adrenal glands are unremarkable.
3.8 cm right upper pole renal cyst. No hydronephrosis. Urinary
bladder is grossly unremarkable for degree of distension.

Stomach/Bowel: Stomach is grossly unremarkable. No suspicious small
bowel wall thickening or inflammation. Left-sided colonic
diverticulosis without findings of acute diverticulitis. Similar
postsurgical changes of prior right hemicolectomy and reanastomosis.

Vascular/Lymphatic: Aortic atherosclerosis. No gastrohepatic or
hepatoduodenal ligament lymphadenopathy. No retroperitoneal or
mesenteric lymphadenopathy. No pelvic sidewall lymphadenopathy. No
groin lymphadenopathy.

Reproductive: Prostate is unremarkable.

Other: Similar appearance of the circumscribed fatty attenuation
lesion in the right lower quadrant in keeping with expected
evolution of a postoperative omental or mesenteric infarct. Slightly
decreased size of the 1.1 cm fluid attenuation cystic lesion of the
aortic bifurcation previously measuring 1.2 cm.

Musculoskeletal: No aggressive lytic or blastic lesion of bone.
Prior left hip arthroplasty.
IMPRESSION: 1. Stable examination status post right hemicolectomy and
reanastomosis. No noncontrasted evidence of recurrence or metastatic
disease in the chest, abdomen or pelvis.
2. No significant change in size of the mass in the pancreatic tail
measuring up to 2.6 cm.
3. Similar appearance of the circumscribed fatty attenuation lesion
in the right lower quadrant in keeping with expected evolution of a
postoperative omental or mesenteric infarct.
4. Similar distal esophageal wall thickening with residual/reflux
contrast in the esophagus suggestive of esophagitis with
gastroesophageal reflux.
5. Cholelithiasis without findings of acute cholecystitis.
6. Left-sided colonic diverticulosis without findings of acute
diverticulitis.
7. Similar mild splenomegaly.
8. Aortic atherosclerosis.

Aortic Atherosclerosis ([id number]).

## 2021-05-10 DIAGNOSIS — Z0000 Encounter for general adult medical examination without abnormal findings: Secondary | ICD-10-CM | POA: Diagnosis not present

## 2021-06-01 DIAGNOSIS — Z0100 Encounter for examination of eyes and vision without abnormal findings: Secondary | ICD-10-CM | POA: Diagnosis not present

## 2021-06-01 DIAGNOSIS — H353132 Nonexudative age-related macular degeneration, bilateral, intermediate dry stage: Secondary | ICD-10-CM | POA: Diagnosis not present

## 2021-06-08 DIAGNOSIS — Z8669 Personal history of other diseases of the nervous system and sense organs: Secondary | ICD-10-CM | POA: Diagnosis not present

## 2021-06-08 DIAGNOSIS — E538 Deficiency of other specified B group vitamins: Secondary | ICD-10-CM | POA: Diagnosis not present

## 2021-06-08 DIAGNOSIS — R519 Headache, unspecified: Secondary | ICD-10-CM | POA: Diagnosis not present

## 2021-06-08 DIAGNOSIS — E559 Vitamin D deficiency, unspecified: Secondary | ICD-10-CM | POA: Diagnosis not present

## 2021-06-08 DIAGNOSIS — F0150 Vascular dementia without behavioral disturbance: Secondary | ICD-10-CM | POA: Diagnosis not present

## 2021-06-08 DIAGNOSIS — G309 Alzheimer's disease, unspecified: Secondary | ICD-10-CM | POA: Diagnosis not present

## 2021-06-08 DIAGNOSIS — F0280 Dementia in other diseases classified elsewhere without behavioral disturbance: Secondary | ICD-10-CM | POA: Diagnosis not present

## 2021-06-13 DIAGNOSIS — H26491 Other secondary cataract, right eye: Secondary | ICD-10-CM | POA: Diagnosis not present

## 2021-06-16 ENCOUNTER — Other Ambulatory Visit: Payer: Self-pay

## 2021-06-16 ENCOUNTER — Emergency Department: Payer: Medicare HMO

## 2021-06-16 ENCOUNTER — Inpatient Hospital Stay
Admission: EM | Admit: 2021-06-16 | Discharge: 2021-06-20 | DRG: 419 | Disposition: A | Discharge status: Home Health | Payer: Medicare HMO | Attending: Internal Medicine | Admitting: Internal Medicine

## 2021-06-16 ENCOUNTER — Encounter: Payer: Self-pay | Admitting: Intensive Care

## 2021-06-16 DIAGNOSIS — R110 Nausea: Secondary | ICD-10-CM

## 2021-06-16 DIAGNOSIS — E1122 Type 2 diabetes mellitus with diabetic chronic kidney disease: Secondary | ICD-10-CM | POA: Diagnosis present

## 2021-06-16 DIAGNOSIS — R109 Unspecified abdominal pain: Secondary | ICD-10-CM

## 2021-06-16 DIAGNOSIS — K8020 Calculus of gallbladder without cholecystitis without obstruction: Secondary | ICD-10-CM | POA: Diagnosis not present

## 2021-06-16 DIAGNOSIS — Z87891 Personal history of nicotine dependence: Secondary | ICD-10-CM

## 2021-06-16 DIAGNOSIS — R10811 Right upper quadrant abdominal tenderness: Secondary | ICD-10-CM

## 2021-06-16 DIAGNOSIS — Z8616 Personal history of COVID-19: Secondary | ICD-10-CM | POA: Diagnosis not present

## 2021-06-16 DIAGNOSIS — Z85038 Personal history of other malignant neoplasm of large intestine: Secondary | ICD-10-CM

## 2021-06-16 DIAGNOSIS — K8012 Calculus of gallbladder with acute and chronic cholecystitis without obstruction: Secondary | ICD-10-CM | POA: Diagnosis not present

## 2021-06-16 DIAGNOSIS — E119 Type 2 diabetes mellitus without complications: Secondary | ICD-10-CM | POA: Diagnosis not present

## 2021-06-16 DIAGNOSIS — S32602A Unspecified fracture of left ischium, initial encounter for closed fracture: Secondary | ICD-10-CM | POA: Diagnosis not present

## 2021-06-16 DIAGNOSIS — K5730 Diverticulosis of large intestine without perforation or abscess without bleeding: Secondary | ICD-10-CM | POA: Diagnosis not present

## 2021-06-16 DIAGNOSIS — Z7982 Long term (current) use of aspirin: Secondary | ICD-10-CM | POA: Diagnosis not present

## 2021-06-16 DIAGNOSIS — D6949 Other primary thrombocytopenia: Secondary | ICD-10-CM | POA: Diagnosis present

## 2021-06-16 DIAGNOSIS — Z9049 Acquired absence of other specified parts of digestive tract: Secondary | ICD-10-CM

## 2021-06-16 DIAGNOSIS — I252 Old myocardial infarction: Secondary | ICD-10-CM

## 2021-06-16 DIAGNOSIS — Z7984 Long term (current) use of oral hypoglycemic drugs: Secondary | ICD-10-CM

## 2021-06-16 DIAGNOSIS — E785 Hyperlipidemia, unspecified: Secondary | ICD-10-CM | POA: Diagnosis present

## 2021-06-16 DIAGNOSIS — R531 Weakness: Secondary | ICD-10-CM | POA: Diagnosis not present

## 2021-06-16 DIAGNOSIS — N1832 Chronic kidney disease, stage 3b: Secondary | ICD-10-CM | POA: Diagnosis present

## 2021-06-16 DIAGNOSIS — K8001 Calculus of gallbladder with acute cholecystitis with obstruction: Secondary | ICD-10-CM | POA: Diagnosis present

## 2021-06-16 DIAGNOSIS — Z79899 Other long term (current) drug therapy: Secondary | ICD-10-CM

## 2021-06-16 DIAGNOSIS — R112 Nausea with vomiting, unspecified: Secondary | ICD-10-CM | POA: Diagnosis not present

## 2021-06-16 DIAGNOSIS — K660 Peritoneal adhesions (postprocedural) (postinfection): Secondary | ICD-10-CM | POA: Diagnosis not present

## 2021-06-16 DIAGNOSIS — K529 Noninfective gastroenteritis and colitis, unspecified: Secondary | ICD-10-CM | POA: Diagnosis present

## 2021-06-16 DIAGNOSIS — I129 Hypertensive chronic kidney disease with stage 1 through stage 4 chronic kidney disease, or unspecified chronic kidney disease: Secondary | ICD-10-CM | POA: Diagnosis present

## 2021-06-16 DIAGNOSIS — Z96642 Presence of left artificial hip joint: Secondary | ICD-10-CM | POA: Diagnosis present

## 2021-06-16 DIAGNOSIS — R1084 Generalized abdominal pain: Secondary | ICD-10-CM | POA: Diagnosis not present

## 2021-06-16 DIAGNOSIS — F0390 Unspecified dementia without behavioral disturbance: Secondary | ICD-10-CM | POA: Diagnosis present

## 2021-06-16 DIAGNOSIS — I2510 Atherosclerotic heart disease of native coronary artery without angina pectoris: Secondary | ICD-10-CM | POA: Diagnosis present

## 2021-06-16 DIAGNOSIS — R935 Abnormal findings on diagnostic imaging of other abdominal regions, including retroperitoneum: Secondary | ICD-10-CM | POA: Diagnosis not present

## 2021-06-16 DIAGNOSIS — I10 Essential (primary) hypertension: Secondary | ICD-10-CM | POA: Diagnosis not present

## 2021-06-16 DIAGNOSIS — K82A1 Gangrene of gallbladder in cholecystitis: Secondary | ICD-10-CM | POA: Diagnosis not present

## 2021-06-16 DIAGNOSIS — K8000 Calculus of gallbladder with acute cholecystitis without obstruction: Secondary | ICD-10-CM

## 2021-06-16 DIAGNOSIS — C189 Malignant neoplasm of colon, unspecified: Secondary | ICD-10-CM | POA: Diagnosis present

## 2021-06-16 DIAGNOSIS — Z8719 Personal history of other diseases of the digestive system: Secondary | ICD-10-CM | POA: Diagnosis not present

## 2021-06-16 DIAGNOSIS — D696 Thrombocytopenia, unspecified: Secondary | ICD-10-CM | POA: Diagnosis not present

## 2021-06-16 DIAGNOSIS — K219 Gastro-esophageal reflux disease without esophagitis: Secondary | ICD-10-CM | POA: Diagnosis present

## 2021-06-16 DIAGNOSIS — R1011 Right upper quadrant pain: Secondary | ICD-10-CM | POA: Diagnosis not present

## 2021-06-16 HISTORY — DX: Unspecified dementia, unspecified severity, without behavioral disturbance, psychotic disturbance, mood disturbance, and anxiety: F03.90

## 2021-06-16 LAB — RESP PANEL BY RT-PCR (FLU A&B, COVID) ARPGX2
Influenza A by PCR: NEGATIVE
Influenza B by PCR: NEGATIVE
SARS Coronavirus 2 by RT PCR: NEGATIVE

## 2021-06-16 LAB — URINALYSIS, COMPLETE (UACMP) WITH MICROSCOPIC
Bilirubin Urine: NEGATIVE
Glucose, UA: NEGATIVE mg/dL
Hgb urine dipstick: NEGATIVE
Ketones, ur: 5 mg/dL — AB
Nitrite: NEGATIVE
Protein, ur: NEGATIVE mg/dL
Specific Gravity, Urine: 1.023 (ref 1.005–1.030)
pH: 5 (ref 5.0–8.0)

## 2021-06-16 LAB — CBC
HCT: 39.6 % (ref 39.0–52.0)
Hemoglobin: 13.1 g/dL (ref 13.0–17.0)
MCH: 28.1 pg (ref 26.0–34.0)
MCHC: 33.1 g/dL (ref 30.0–36.0)
MCV: 84.8 fL (ref 80.0–100.0)
Platelets: 122 10*3/uL — ABNORMAL LOW (ref 150–400)
RBC: 4.67 MIL/uL (ref 4.22–5.81)
RDW: 13.4 % (ref 11.5–15.5)
WBC: 7.9 10*3/uL (ref 4.0–10.5)
nRBC: 0 % (ref 0.0–0.2)

## 2021-06-16 LAB — COMPREHENSIVE METABOLIC PANEL
ALT: 15 U/L (ref 0–44)
AST: 15 U/L (ref 15–41)
Albumin: 4.5 g/dL (ref 3.5–5.0)
Alkaline Phosphatase: 70 U/L (ref 38–126)
Anion gap: 10 (ref 5–15)
BUN: 51 mg/dL — ABNORMAL HIGH (ref 8–23)
CO2: 24 mmol/L (ref 22–32)
Calcium: 9.6 mg/dL (ref 8.9–10.3)
Chloride: 99 mmol/L (ref 98–111)
Creatinine, Ser: 1.69 mg/dL — ABNORMAL HIGH (ref 0.61–1.24)
GFR, Estimated: 41 mL/min — ABNORMAL LOW (ref >60)
Glucose, Bld: 185 mg/dL — ABNORMAL HIGH (ref 70–99)
Potassium: 4.7 mmol/L (ref 3.5–5.1)
Sodium: 133 mmol/L — ABNORMAL LOW (ref 135–145)
Total Bilirubin: 1 mg/dL (ref 0.3–1.2)
Total Protein: 7.7 g/dL (ref 6.5–8.1)

## 2021-06-16 LAB — TROPONIN I (HIGH SENSITIVITY): Troponin I (High Sensitivity): 3 ng/L (ref <18)

## 2021-06-16 LAB — LIPASE, BLOOD: Lipase: 33 U/L (ref 11–51)

## 2021-06-16 LAB — CBG MONITORING, ED: Glucose-Capillary: 194 mg/dL — ABNORMAL HIGH (ref 70–99)

## 2021-06-16 MED ORDER — MORPHINE SULFATE (PF) 4 MG/ML IV SOLN
4.0000 mg | Freq: Once | INTRAVENOUS | Status: AC
  Administered 2021-06-16: 4 mg via INTRAVENOUS
  Filled 2021-06-16: qty 1

## 2021-06-16 MED ORDER — SODIUM CHLORIDE 0.9 % IV BOLUS
1000.0000 mL | Freq: Once | INTRAVENOUS | Status: AC
  Administered 2021-06-16: 1000 mL via INTRAVENOUS

## 2021-06-16 MED ORDER — METOCLOPRAMIDE HCL 5 MG/ML IJ SOLN
10.0000 mg | Freq: Once | INTRAMUSCULAR | Status: AC
  Administered 2021-06-16: 10 mg via INTRAVENOUS
  Filled 2021-06-16: qty 2

## 2021-06-16 MED ORDER — FENTANYL CITRATE (PF) 100 MCG/2ML IJ SOLN
50.0000 ug | Freq: Once | INTRAMUSCULAR | Status: AC
  Administered 2021-06-16: 50 ug via INTRAVENOUS
  Filled 2021-06-16: qty 2

## 2021-06-16 MED ORDER — ONDANSETRON 4 MG PO TBDP
4.0000 mg | ORAL_TABLET | Freq: Once | ORAL | Status: AC | PRN
  Administered 2021-06-16: 4 mg via ORAL
  Filled 2021-06-16: qty 1

## 2021-06-16 MED ORDER — ONDANSETRON HCL 4 MG/2ML IJ SOLN
4.0000 mg | Freq: Once | INTRAMUSCULAR | Status: AC
  Administered 2021-06-16: 4 mg via INTRAVENOUS
  Filled 2021-06-16: qty 2
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
## 2021-06-16 NOTE — ED Triage Notes (Signed)
Patient c/o abdominal pain and nausea that started this AM. Reports constant burping X2 weeks. Last BM today

## 2021-06-16 NOTE — Discharge Instructions (Addendum)
In addition to included general post-operative instructions,  Diet: Resume home diet. Recommend avoiding or limiting fatty/greasy foods over the next few days/week. If you do eat these, you may (or may not) notice diarrhea. This is expected while your body adjusts to not having a gallbladder, and it typically resolves with time.   Activity: No heavy lifting >20 pounds (children, pets, laundry, garbage) or strenuous activity for 4 weeks, but light activity and walking are encouraged. Do not drive or drink alcohol if taking narcotic pain medications or having pain that might distract from driving.  Wound care: If you can keep drain site waterproofed, 2 days after surgery (07/19), you may shower/get incision wet with soapy water and pat dry (do not rub incisions), but no baths or submerging incision underwater until follow-up.   Medications: Resume all home medications. For mild to moderate pain: acetaminophen (Tylenol) or ibuprofen/naproxen (if no kidney disease). Combining Tylenol with alcohol can substantially increase your risk of causing liver disease. Narcotic pain medications, if prescribed, can be used for severe pain, though may cause nausea, constipation, and drowsiness. Do not combine Tylenol and Percocet (or similar) within a 6 hour period as Percocet (and similar) contain(s) Tylenol. If you do not need the narcotic pain medication, you do not need to fill the prescription.  Call office [phone] / [phone]) at any time if any questions, worsening pain, fevers/chills, bleeding, drainage from incision site, or other concerns.

## 2021-06-17 ENCOUNTER — Observation Stay: Payer: Medicare HMO

## 2021-06-17 ENCOUNTER — Encounter: Payer: Self-pay | Admitting: Family Medicine

## 2021-06-17 DIAGNOSIS — K82A1 Gangrene of gallbladder in cholecystitis: Secondary | ICD-10-CM | POA: Diagnosis not present

## 2021-06-17 DIAGNOSIS — I2510 Atherosclerotic heart disease of native coronary artery without angina pectoris: Secondary | ICD-10-CM | POA: Diagnosis present

## 2021-06-17 DIAGNOSIS — D6949 Other primary thrombocytopenia: Secondary | ICD-10-CM | POA: Diagnosis present

## 2021-06-17 DIAGNOSIS — E119 Type 2 diabetes mellitus without complications: Secondary | ICD-10-CM

## 2021-06-17 DIAGNOSIS — K529 Noninfective gastroenteritis and colitis, unspecified: Secondary | ICD-10-CM | POA: Diagnosis present

## 2021-06-17 DIAGNOSIS — K8020 Calculus of gallbladder without cholecystitis without obstruction: Secondary | ICD-10-CM | POA: Diagnosis not present

## 2021-06-17 DIAGNOSIS — R109 Unspecified abdominal pain: Secondary | ICD-10-CM

## 2021-06-17 DIAGNOSIS — K8000 Calculus of gallbladder with acute cholecystitis without obstruction: Secondary | ICD-10-CM

## 2021-06-17 DIAGNOSIS — F0390 Unspecified dementia without behavioral disturbance: Secondary | ICD-10-CM

## 2021-06-17 DIAGNOSIS — Z7984 Long term (current) use of oral hypoglycemic drugs: Secondary | ICD-10-CM | POA: Diagnosis not present

## 2021-06-17 DIAGNOSIS — Z85038 Personal history of other malignant neoplasm of large intestine: Secondary | ICD-10-CM | POA: Diagnosis not present

## 2021-06-17 DIAGNOSIS — E785 Hyperlipidemia, unspecified: Secondary | ICD-10-CM | POA: Diagnosis present

## 2021-06-17 DIAGNOSIS — Z9049 Acquired absence of other specified parts of digestive tract: Secondary | ICD-10-CM | POA: Diagnosis not present

## 2021-06-17 DIAGNOSIS — R110 Nausea: Secondary | ICD-10-CM | POA: Diagnosis not present

## 2021-06-17 DIAGNOSIS — I252 Old myocardial infarction: Secondary | ICD-10-CM | POA: Diagnosis not present

## 2021-06-17 DIAGNOSIS — Z8616 Personal history of COVID-19: Secondary | ICD-10-CM | POA: Diagnosis not present

## 2021-06-17 DIAGNOSIS — I129 Hypertensive chronic kidney disease with stage 1 through stage 4 chronic kidney disease, or unspecified chronic kidney disease: Secondary | ICD-10-CM | POA: Diagnosis present

## 2021-06-17 DIAGNOSIS — R935 Abnormal findings on diagnostic imaging of other abdominal regions, including retroperitoneum: Secondary | ICD-10-CM

## 2021-06-17 DIAGNOSIS — Z79899 Other long term (current) drug therapy: Secondary | ICD-10-CM | POA: Diagnosis not present

## 2021-06-17 DIAGNOSIS — E1122 Type 2 diabetes mellitus with diabetic chronic kidney disease: Secondary | ICD-10-CM | POA: Diagnosis present

## 2021-06-17 DIAGNOSIS — K219 Gastro-esophageal reflux disease without esophagitis: Secondary | ICD-10-CM | POA: Diagnosis present

## 2021-06-17 DIAGNOSIS — Z7982 Long term (current) use of aspirin: Secondary | ICD-10-CM | POA: Diagnosis not present

## 2021-06-17 DIAGNOSIS — K660 Peritoneal adhesions (postprocedural) (postinfection): Secondary | ICD-10-CM | POA: Diagnosis not present

## 2021-06-17 DIAGNOSIS — Z87891 Personal history of nicotine dependence: Secondary | ICD-10-CM | POA: Diagnosis not present

## 2021-06-17 DIAGNOSIS — Z96642 Presence of left artificial hip joint: Secondary | ICD-10-CM | POA: Diagnosis present

## 2021-06-17 DIAGNOSIS — K8001 Calculus of gallbladder with acute cholecystitis with obstruction: Secondary | ICD-10-CM | POA: Diagnosis present

## 2021-06-17 DIAGNOSIS — D696 Thrombocytopenia, unspecified: Secondary | ICD-10-CM

## 2021-06-17 DIAGNOSIS — N1832 Chronic kidney disease, stage 3b: Secondary | ICD-10-CM | POA: Diagnosis present

## 2021-06-17 DIAGNOSIS — I10 Essential (primary) hypertension: Secondary | ICD-10-CM | POA: Diagnosis not present

## 2021-06-17 LAB — CBC
HCT: 34.7 % — ABNORMAL LOW (ref 39.0–52.0)
Hemoglobin: 11.9 g/dL — ABNORMAL LOW (ref 13.0–17.0)
MCH: 28.7 pg (ref 26.0–34.0)
MCHC: 34.3 g/dL (ref 30.0–36.0)
MCV: 83.8 fL (ref 80.0–100.0)
Platelets: 97 10*3/uL — ABNORMAL LOW (ref 150–400)
RBC: 4.14 MIL/uL — ABNORMAL LOW (ref 4.22–5.81)
RDW: 13.2 % (ref 11.5–15.5)
WBC: 8.6 10*3/uL (ref 4.0–10.5)
nRBC: 0 % (ref 0.0–0.2)

## 2021-06-17 LAB — GLUCOSE, CAPILLARY
Glucose-Capillary: 201 mg/dL — ABNORMAL HIGH (ref 70–99)
Glucose-Capillary: 170 mg/dL — ABNORMAL HIGH (ref 70–99)
Glucose-Capillary: 142 mg/dL — ABNORMAL HIGH (ref 70–99)
Glucose-Capillary: 150 mg/dL — ABNORMAL HIGH (ref 70–99)
Glucose-Capillary: 158 mg/dL — ABNORMAL HIGH (ref 70–99)

## 2021-06-17 LAB — BASIC METABOLIC PANEL
Anion gap: 9 (ref 5–15)
BUN: 46 mg/dL — ABNORMAL HIGH (ref 8–23)
CO2: 23 mmol/L (ref 22–32)
Calcium: 8.8 mg/dL — ABNORMAL LOW (ref 8.9–10.3)
Chloride: 102 mmol/L (ref 98–111)
Creatinine, Ser: 1.53 mg/dL — ABNORMAL HIGH (ref 0.61–1.24)
GFR, Estimated: 46 mL/min — ABNORMAL LOW (ref >60)
Glucose, Bld: 214 mg/dL — ABNORMAL HIGH (ref 70–99)
Potassium: 4.9 mmol/L (ref 3.5–5.1)
Sodium: 134 mmol/L — ABNORMAL LOW (ref 135–145)

## 2021-06-17 LAB — HEMOGLOBIN A1C
Hgb A1c MFr Bld: 6.5 % — ABNORMAL HIGH (ref 4.8–5.6)
Mean Plasma Glucose: 139.85 mg/dL

## 2021-06-17 MED ORDER — PIPERACILLIN-TAZOBACTAM 3.375 G IVPB
3.3750 g | Freq: Three times a day (TID) | INTRAVENOUS | Status: DC
  Administered 2021-06-17 – 2021-06-20 (×8): 3.375 g via INTRAVENOUS
  Filled 2021-06-17 (×8): qty 50

## 2021-06-17 MED ORDER — ONDANSETRON HCL 4 MG PO TABS
4.0000 mg | ORAL_TABLET | Freq: Four times a day (QID) | ORAL | Status: DC | PRN
  Administered 2021-06-17: 4 mg via ORAL
  Filled 2021-06-17: qty 1

## 2021-06-17 MED ORDER — PANTOPRAZOLE SODIUM 40 MG IV SOLR
40.0000 mg | Freq: Two times a day (BID) | INTRAVENOUS | Status: DC
  Administered 2021-06-17 – 2021-06-19 (×4): 40 mg via INTRAVENOUS
  Filled 2021-06-17 (×4): qty 40

## 2021-06-17 MED ORDER — ONDANSETRON HCL 4 MG/2ML IJ SOLN
4.0000 mg | Freq: Four times a day (QID) | INTRAMUSCULAR | Status: DC | PRN
  Administered 2021-06-17: 4 mg via INTRAVENOUS
  Filled 2021-06-17: qty 2

## 2021-06-17 MED ORDER — DONEPEZIL HCL 5 MG PO TABS
10.0000 mg | ORAL_TABLET | Freq: Every day | ORAL | Status: DC
  Administered 2021-06-17 – 2021-06-19 (×4): 10 mg via ORAL
  Filled 2021-06-17 (×4): qty 2

## 2021-06-17 MED ORDER — TRAZODONE HCL 50 MG PO TABS
25.0000 mg | ORAL_TABLET | Freq: Every evening | ORAL | Status: DC | PRN
  Administered 2021-06-17 – 2021-06-20 (×4): 25 mg via ORAL
  Filled 2021-06-17 (×4): qty 1

## 2021-06-17 MED ORDER — MAGNESIUM HYDROXIDE 400 MG/5ML PO SUSP
30.0000 mL | Freq: Every day | ORAL | Status: DC | PRN
  Administered 2021-06-19 – 2021-06-20 (×2): 30 mL via ORAL
  Filled 2021-06-17 (×2): qty 30

## 2021-06-17 MED ORDER — LABETALOL HCL 5 MG/ML IV SOLN: 20.0000 mg | INTRAVENOUS | Status: DC | PRN

## 2021-06-17 MED ORDER — ENOXAPARIN SODIUM 40 MG/0.4ML IJ SOSY
40.0000 mg | PREFILLED_SYRINGE | INTRAMUSCULAR | Status: DC
  Administered 2021-06-17: 40 mg via SUBCUTANEOUS
  Filled 2021-06-17: qty 0.4

## 2021-06-17 MED ORDER — INDOCYANINE GREEN 25 MG IV SOLR
2.5000 mg | INTRAVENOUS | Status: DC
  Filled 2021-06-17: qty 10

## 2021-06-17 MED ORDER — SIMVASTATIN 20 MG PO TABS
40.0000 mg | ORAL_TABLET | Freq: Every evening | ORAL | Status: DC
  Administered 2021-06-17 – 2021-06-19 (×3): 40 mg via ORAL
  Filled 2021-06-17 (×4): qty 2

## 2021-06-17 MED ORDER — FERROUS SULFATE 325 (65 FE) MG PO TBEC: 325.0000 mg | DELAYED_RELEASE_TABLET | Freq: Two times a day (BID) | ORAL | Status: DC

## 2021-06-17 MED ORDER — HALOPERIDOL LACTATE 5 MG/ML IJ SOLN
2.0000 mg | Freq: Once | INTRAMUSCULAR | Status: AC
  Administered 2021-06-18: 2 mg via INTRAVENOUS

## 2021-06-17 MED ORDER — POLYVINYL ALCOHOL 1.4 % OP SOLN
1.0000 [drp] | Freq: Two times a day (BID) | OPHTHALMIC | Status: DC | PRN
  Filled 2021-06-17 (×2): qty 15

## 2021-06-17 MED ORDER — ACETAMINOPHEN 650 MG RE SUPP: 650.0000 mg | Freq: Four times a day (QID) | RECTAL | Status: DC | PRN

## 2021-06-17 MED ORDER — INSULIN ASPART 100 UNIT/ML IJ SOLN
0.0000 [IU] | Freq: Three times a day (TID) | INTRAMUSCULAR | Status: DC
  Administered 2021-06-17 (×2): 2 [IU] via SUBCUTANEOUS
  Administered 2021-06-17 (×2): 1 [IU] via SUBCUTANEOUS
  Administered 2021-06-18 – 2021-06-19 (×5): 2 [IU] via SUBCUTANEOUS
  Administered 2021-06-19 – 2021-06-20 (×2): 1 [IU] via SUBCUTANEOUS
  Filled 2021-06-17 (×11): qty 1

## 2021-06-17 MED ORDER — MEMANTINE HCL 5 MG PO TABS
10.0000 mg | ORAL_TABLET | Freq: Every day | ORAL | Status: DC
  Administered 2021-06-17 – 2021-06-20 (×3): 10 mg via ORAL
  Filled 2021-06-17 (×3): qty 2

## 2021-06-17 MED ORDER — INSULIN NPH ISOPHANE & REGULAR (70-30) 100 UNIT/ML ~~LOC~~ SUSP: 5.0000 [IU] | Freq: Two times a day (BID) | SUBCUTANEOUS | Status: DC

## 2021-06-17 MED ORDER — PANTOPRAZOLE SODIUM 40 MG IV SOLR: 40.0000 mg | Freq: Two times a day (BID) | INTRAVENOUS | Status: DC

## 2021-06-17 MED ORDER — GABAPENTIN 300 MG PO CAPS
300.0000 mg | ORAL_CAPSULE | Freq: Every day | ORAL | Status: DC
  Administered 2021-06-17 – 2021-06-19 (×4): 300 mg via ORAL
  Filled 2021-06-17 (×4): qty 1

## 2021-06-17 MED ORDER — ACETAMINOPHEN 325 MG PO TABS
650.0000 mg | ORAL_TABLET | Freq: Four times a day (QID) | ORAL | Status: DC | PRN
  Administered 2021-06-17 (×2): 650 mg via ORAL
  Filled 2021-06-17 (×2): qty 2

## 2021-06-17 MED ORDER — TECHNETIUM TC 99M MEBROFENIN IV KIT
5.0000 | PACK | Freq: Once | INTRAVENOUS | Status: AC | PRN
  Administered 2021-06-17: 5.07 via INTRAVENOUS

## 2021-06-17 MED ORDER — SODIUM CHLORIDE 0.9 % IV SOLN: INTRAVENOUS | Status: DC

## 2021-06-17 NOTE — Progress Notes (Signed)
PT Cancellation Note  Patient Details Name: Daniel Sherman MRN: [id number] DOB: [birth year]   Cancelled Treatment:    Reason Eval/Treat Not Completed: Patient at procedure or test/unavailable.  PT consult received.  Chart reviewed.  Upon PT arrival to pt's room, transport present to take pt off unit for testing.  Will re-attempt PT evaluation at a later date/time.  Leitha Bleak, PT 06/17/21, 2:30 PM
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
## 2021-06-17 NOTE — Progress Notes (Signed)
OT Cancellation Note  Patient Details Name: [name] MRN: [id number] DOB: [birth year]   Cancelled Treatment:    Reason Eval/Treat Not Completed: Patient at procedure or test/ unavailable Physician with patient providing education at this time. Will f/u for OT evaluation at later date/time as able. Thank you.  Daniel Sherman, [place], OTR/L ascom [phone] 06/17/21, 5:31 PM

## 2021-06-17 NOTE — Progress Notes (Signed)
06/17/21  Patient's U/S showed gallbladder wall thickening and trace pericholecystic fluid.  HIDA scan was ordered to confirm and there is no filling of the gallbladder at 90 minutes.  Morphine could not be given to augment filling of the gallbladder, but overall based on his symptoms, U/S, and HIDA scan, this is very likely to be acute cholecystitis.  Discussed with the patient's son, Audelia Acton, about surgery tomorrow.  Discussed the role of robotic assisted cholecystectomy, and reviewed the surgery in length with him, including risks of bleeding, infection, and injury to surrounding structures.  They're willing to proceed.  Will post him for OR tomorrow morning pending anesthesia team availability.  Will order clear liquids for now and NPO after midnight.  Hold lovenox for surgery tomorrow.  Consent order placed.    Face-to-face time spent with the patient and care providers was 25 minutes, with more than 50% of the time spent counseling, educating, and coordinating care of the patient.     Olean Ree, MD
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
## 2021-06-17 NOTE — Progress Notes (Signed)
PROGRESS NOTE    Daniel Sherman  KYH:[id number] DOB: 1941-11-08 DOA: 06/16/2021 PCP: [name], MD    Chief Complaint  Patient presents with   Abdominal Pain   Nausea    Brief Narrative:  80 y.o. Caucasian male with medical history significant for type 2 diabetes mellitus, hypertension, dyslipidemia, GERD, Colon cancer status post right hemicolectomy coronary artery disease and dementia, who presented to the emergency room with acute onset of belching and burping over the last couple of days as well as nausea with associated vomiting/spitting  and abdominal pain mainly in the periumbilical area with a feeling of heartburn that has been going on over the last couple weeks.  CT of the abdomen showed no acute intra-abdominal pathology, cholelithiasis. 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  Will probably need enhanced MRI examination.  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 Stable ununited left ischial fracture.   Assessment & Plan:   Active Problems:   Diabetes mellitus without complication (HCC)   Primary thrombocytopenia (HCC)   Essential hypertension   Colon cancer (HCC)   Acute gastroenteritis  Recurrent nausea vomiting with abdominal pain suspect possibly from acute gastritis/gastroenteritis. Rule out acute cholecystitis as he was found to have cholelithiasis with some right upper quadrant tenderness. Start the patient on IV Protonix PPI twice daily for 24 hours later changed to 40 mg every 24 hours. Continue with IV antiemetics, gentle hydration. Pain control.  Clear liquid diet as tolerated.    Cholelithiasis with some mild right upper quadrant tenderness Ultrasound of the abdomen ordered to evaluate for acute cholecystitis General  surgery consulted by EDP.    Type 2 diabetes mellitus, non-insulin-dependent, CBG (last 3)  Recent Labs    06/16/21 2052 06/17/21 0111 06/17/21 0831  GLUCAP 194* 201* 170*   Continue with sliding scale insulin at this time Obtain hemoglobin A1c.    Essential hypertension Blood pressure parameters appear to be optimal at this time    Mild dementia Patient is on Aricept and Namenda, continue the same.   Hyperlipidemia Continue with Zocor.   History of chronic thrombocytopenia No bleeding evident continue to monitor.   History of colon cancer s/p hemicolectomy CT of the abdomen pelvis showed stable hemicolectomy.   DVT prophylaxis: (Lovenox) Code Status: (Full code) Family Communication: (family at bedside).  Disposition:   Status is: Observation  The patient will require care spanning > 2 midnights and should be moved to inpatient because: Ongoing diagnostic testing needed not appropriate for outpatient work up and IV treatments appropriate due to intensity of illness or inability to take PO  Dispo: The patient is from: Home              Anticipated d/c is to:  pending.               Patient currently is not medically stable to d/c.   Difficult to place patient No       Consultants:  General surgery.   Procedures: CT abdomen and pelvis US abdomen.   Antimicrobials: none.    Subjective: Nausea, abdomina pain has improved, but not resolved   Objective: Vitals:   06/16/21 2300 06/17/21 0000 06/17/21 0104 06/17/21 0654  BP: (!) 149/67 (!) 141/64 (!) 147/65 (!) 124/50  Pulse: 78 86 88 94  Resp: (!) 21  19 20 20   Temp:   97.8 F (36.6 C) 97.7 F (36.5 C)  TempSrc:   Oral   SpO2: 98% 96% 100% 96%  Weight:   85.7 kg   Height:   5\' 5"  (1.651 m)     Intake/Output Summary (Last 24 hours) at 06/17/2021 1113 Last data filed at 06/17/2021 0644 Gross per 24 hour  Intake 1522.78 ml  Output 350 ml  Net 1172.78 ml   Filed Weights   06/16/21 1750  06/17/21 0104  Weight: 81.6 kg 85.7 kg    Examination:  General exam: Appears calm and comfortable  Respiratory system: Clear to auscultation. Respiratory effort normal. Cardiovascular system: S1 & S2 heard, RRR. No JVD,  No pedal edema. Gastrointestinal system: Abdomen is nondistended, soft and nontender. Normal bowel sounds heard. Central nervous system: SLEEPY BUT ABLE to answer simple questions. Reports he didn't sleep well last night.  Extremities: no pedal edema.  Skin: No rashes, lesions or ulcers Psychiatry: cannot be assessed.     Data Reviewed: I have personally reviewed following labs and imaging studies  CBC: Recent Labs  Lab 06/16/21 1759 06/17/21 0518  WBC 7.9 8.6  HGB 13.1 11.9*  HCT 39.6 34.7*  MCV 84.8 83.8  PLT 122* 97*    Basic Metabolic Panel: Recent Labs  Lab 06/16/21 1759 06/17/21 0518  NA 133* 134*  K 4.7 4.9  CL 99 102  CO2 24 23  GLUCOSE 185* 214*  BUN 51* 46*  CREATININE 1.69* 1.53*  CALCIUM 9.6 8.8*    GFR: Estimated Creatinine Clearance: 38.8 mL/min (A) (by C-G formula based on SCr of 1.53 mg/dL (H)).  Liver Function Tests: Recent Labs  Lab 06/16/21 1759  AST 15  ALT 15  ALKPHOS 70  BILITOT 1.0  PROT 7.7  ALBUMIN 4.5    CBG: Recent Labs  Lab 06/16/21 2052 06/17/21 0111 06/17/21 0831  GLUCAP 194* 201* 170*     Recent Results (from the past 240 hour(s))  Resp Panel by RT-PCR (Flu A&B, Covid) Nasopharyngeal Swab     Status: None   Collection Time: 06/16/21  9:31 PM   Specimen: Nasopharyngeal Swab; Nasopharyngeal(NP) swabs in vial transport medium  Result Value Ref Range Status   SARS Coronavirus 2 by RT PCR NEGATIVE NEGATIVE Final    Comment: (NOTE) SARS-CoV-2 target nucleic acids are NOT DETECTED.  The SARS-CoV-2 RNA is generally detectable in upper respiratory specimens during the acute phase of infection. The lowest concentration of SARS-CoV-2 viral copies this assay can detect is 138 copies/mL. A negative  result does not preclude SARS-Cov-2 infection and should not be used as the sole basis for treatment or other patient management decisions. A negative result may occur with  improper specimen collection/handling, submission of specimen other than nasopharyngeal swab, presence of viral mutation(s) within the areas targeted by this assay, and inadequate number of viral copies(<138 copies/mL). A negative result must be combined with clinical observations, patient history, and epidemiological information. The expected result is Negative.  Fact Sheet for Patients:  EntrepreneurPulse.com.au  Fact Sheet for Healthcare Providers:  IncredibleEmployment.be  This test is no t yet approved or cleared by the [place] FDA and  has been authorized for detection and/or diagnosis of SARS-CoV-2 by FDA under an Emergency Use Authorization (EUA). This EUA will remain  in effect (meaning this test can be used) for the duration of the COVID-19 declaration under Section 564(b)(1) of the Act, 21 U.S.C.section 360bbb-3(b)(1), unless the authorization is terminated  or revoked  sooner.       Influenza A by PCR NEGATIVE NEGATIVE Final   Influenza B by PCR NEGATIVE NEGATIVE Final    Comment: (NOTE) The Xpert Xpress SARS-CoV-2/FLU/RSV plus assay is intended as an aid in the diagnosis of influenza from Nasopharyngeal swab specimens and should not be used as a sole basis for treatment. Nasal washings and aspirates are unacceptable for Xpert Xpress SARS-CoV-2/FLU/RSV testing.  Fact Sheet for Patients: EntrepreneurPulse.com.au  Fact Sheet for Healthcare Providers: IncredibleEmployment.be  This test is not yet approved or cleared by the [place] FDA and has been authorized for detection and/or diagnosis of SARS-CoV-2 by FDA under an Emergency Use Authorization (EUA). This EUA will remain in effect (meaning this test can be used)  for the duration of the COVID-19 declaration under Section 564(b)(1) of the Act, 21 U.S.C. section 360bbb-3(b)(1), unless the authorization is terminated or revoked.  Performed at [facility], [address] [place], [place] [address]          Radiology Studies: CT ABDOMEN PELVIS WO CONTRAST  Result Date: 06/16/2021 CLINICAL DATA:  Nausea, acute nonlocalized abdominal pain EXAM: CT ABDOMEN AND PELVIS WITHOUT CONTRAST TECHNIQUE: Multidetector CT imaging of the abdomen and pelvis was performed following the standard protocol without IV contrast. COMPARISON:  03/21/2021 FINDINGS: Lower chest: The visualized lung bases are clear. Extensive multi-vessel coronary artery calcification is noted. Global cardiac size within normal limits. Hepatobiliary: Cholelithiasis noted without pericholecystic inflammatory change. Liver unremarkable. No intra or extrahepatic biliary ductal dilation. Pancreas: When measured in similar fashion, stable mass within the tail the pancreas measuring 3.5 x 2.5 cm, not well characterized on this examination. The pancreas is otherwise unremarkable. Spleen: Mild splenomegaly is stable with the spleen measuring 14.8 cm in greatest dimension. Adrenals/Urinary Tract: The adrenal glands are unremarkable. The kidneys are normal in size and position. Exophytic 3.6 cm cortical cyst noted arising from the upper pole of the right kidney. Mild bilateral nonspecific perinephric stranding. No intrarenal or definite ureteral calculi. No hydronephrosis. Streak artifact slightly limits evaluation of the distal ureters and bladder. The visualized bladder is unremarkable. Stomach/Bowel: Moderate descending and sigmoid colonic diverticulosis. Right hemicolectomy has been performed. Stable encapsulated fat within the right lower quadrant may reflect the sequela of omental infarct/fat necrosis and may be postsurgical in nature. The stomach, small bowel, and large bowel are otherwise  unremarkable. No free intraperitoneal gas or fluid. Vascular/Lymphatic: Moderate aortoiliac atherosclerotic calcification. Moderate atherosclerotic calcification noted at the origin of the superior mesenteric artery. No aortic aneurysm. No pathologic adenopathy within the abdomen and pelvis. Reproductive: Prostate is unremarkable. Other: No abdominal wall hernia. Musculoskeletal: Bilateral L5 pars defects are identified without associated spondylolisthesis. Mild degenerative changes are seen within the lumbar spine. Left total hip arthroplasty has been performed. There is a mildly displaced anatomically aligned ununited remote fracture of the left ischium, unchanged from prior examination. No acute bone abnormality. IMPRESSION: No acute intra-abdominal pathology identified. No definite radiographic explanation for the patient's reported symptoms. Extensive coronary artery calcification. Cholelithiasis. 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 While stable since immediate prior examination, this demonstrates slow progressive growth since remote examination of 12/10/2016 and was not present on remote prior examination of 06/09/2014. This could be better assessed with contrast enhanced MRI examination, if clinically indicated. Moderate distal colonic diverticulosis. Stable changes of right hemicolectomy with stable omental infarct/fat necrosis within the right lower quadrant, possibly postsurgical in nature. Stable ununited left ischial fracture. Aortic  Atherosclerosis (ICD10-I70.0). Electronically Signed   By: Fidela Salisbury MD   On: 06/16/2021 19:46        Scheduled Meds:  donepezil  10 mg Oral QHS   enoxaparin (LOVENOX) injection  40 mg Subcutaneous Q24H   gabapentin  300 mg Oral QHS   haloperidol lactate  2 mg Intravenous Once   insulin aspart  0-9 Units Subcutaneous TID AC & HS   memantine  10 mg Oral Daily   simvastatin  40 mg Oral QPM    Continuous Infusions:  sodium chloride Stopped (06/17/21 0749)     LOS: 0 days        Hosie Poisson, MD [facility]   To contact the attending provider between 7A-7P or the covering provider during after hours 7P-7A, please log into the web site [email or web address] and access using universal  password for that web site. If you do not have the password, please call the hospital operator.  06/17/2021, 11:13 AM

## 2021-06-17 NOTE — Progress Notes (Signed)
Pharmacy Antibiotic Note  RAWAD BOCHICCHIO is a 80 y.o. male with medical history significant for type 2 diabetes mellitus, hypertension, dyslipidemia, GERD, colon cancer status post right hemicolectomy coronary artery disease and dementia, who presented to the emergency room with acute onset of belching as well as nausea with associated vomiting/spitting and abdominal pain with a feeling of heartburn. CT of the abdomen from 7/15 showed no acute intra-abdominal pathology, cholelithiasis. US abdomen from 7/16 with cholelithiasis and mild gallbladder wall thickening with minimal pericholecystic fluid, possibly represent early changes of acute cholecystitis. Pharmacy has been consulted for Zoysn dosing for acute cholecystitis.  Plan: Start Zosyn 3.375g IV q8h (4 hour infusion) Monitor renal function and adjust dose as clinically indicated  Height: 5\' 5"  (165.1 cm) Weight: 85.7 kg (189 lb) IBW/kg (Calculated) : 61.5  Temp (24hrs), Avg:97.7 F (36.5 C), Min:97.7 F (36.5 C), Max:97.8 F (36.6 C)  Recent Labs  Lab 06/16/21 1759 06/17/21 0518  WBC 7.9 8.6  CREATININE 1.69* 1.53*    Estimated Creatinine Clearance: 38.8 mL/min (A) (by C-G formula based on SCr of 1.53 mg/dL (H)).    Allergies  Allergen Reactions   Other     BLOOD PRODUCT REFUSAL    Antimicrobials this admission: 7/16 Zosyn >>   Dose adjustments this admission:  Microbiology results:   Thank you for allowing pharmacy to be a part of this patient's care.  Sherilyn Banker, PharmD Clinical Pharmacist 06/17/2021 3:45 PM

## 2021-06-17 NOTE — Plan of Care (Signed)
  Problem: Coping: Goal: Level of anxiety will decrease Outcome: Progressing   Problem: Elimination: Goal: Will not experience complications related to bowel motility Outcome: Progressing Goal: Will not experience complications related to urinary retention Outcome: Progressing   Problem: Safety: Goal: Ability to remain free from injury will improve Outcome: Progressing   Problem: Education: Goal: Knowledge of General Education information will improve Description: Including pain rating scale, medication(s)/side effects and non-pharmacologic comfort measures Outcome: Not Progressing   Problem: Health Behavior/Discharge Planning: Goal: Ability to manage health-related needs will improve Outcome: Not Progressing   Problem: Clinical Measurements: Goal: Ability to maintain clinical measurements within normal limits will improve Outcome: Not Progressing Goal: Will remain free from infection Outcome: Not Progressing Goal: Diagnostic test results will improve Outcome: Not Progressing Goal: Respiratory complications will improve Outcome: Not Progressing Goal: Cardiovascular complication will be avoided Outcome: Not Progressing   Problem: Activity: Goal: Risk for activity intolerance will decrease Outcome: Not Progressing   Problem: Nutrition: Goal: Adequate nutrition will be maintained Outcome: Not Progressing   Problem: Pain Managment: Goal: General experience of comfort will improve Outcome: Not Progressing   Problem: Skin Integrity: Goal: Risk for impaired skin integrity will decrease Outcome: Not Progressing   The patient is admitted to 1 A room 159 with the diagnosis of acute gastroenteritis. A & O x 2. No acute distress noted. Wife at bedside voiced no concern. Full assessment to epic completed. Will continue to monitor.

## 2021-06-18 ENCOUNTER — Inpatient Hospital Stay: Payer: Medicare HMO | Admitting: Anesthesiology

## 2021-06-18 ENCOUNTER — Encounter
Admission: EM | Disposition: A | Discharge status: Home Health | Payer: Self-pay | Source: Home / Self Care | Attending: Internal Medicine

## 2021-06-18 DIAGNOSIS — K82A1 Gangrene of gallbladder in cholecystitis: Secondary | ICD-10-CM

## 2021-06-18 DIAGNOSIS — K8001 Calculus of gallbladder with acute cholecystitis with obstruction: Principal | ICD-10-CM

## 2021-06-18 DIAGNOSIS — K8000 Calculus of gallbladder with acute cholecystitis without obstruction: Secondary | ICD-10-CM

## 2021-06-18 DIAGNOSIS — Z9049 Acquired absence of other specified parts of digestive tract: Secondary | ICD-10-CM

## 2021-06-18 DIAGNOSIS — K660 Peritoneal adhesions (postprocedural) (postinfection): Secondary | ICD-10-CM

## 2021-06-18 LAB — BASIC METABOLIC PANEL
Anion gap: 7 (ref 5–15)
BUN: 30 mg/dL — ABNORMAL HIGH (ref 8–23)
CO2: 26 mmol/L (ref 22–32)
Calcium: 8.3 mg/dL — ABNORMAL LOW (ref 8.9–10.3)
Chloride: 103 mmol/L (ref 98–111)
Creatinine, Ser: 1.61 mg/dL — ABNORMAL HIGH (ref 0.61–1.24)
GFR, Estimated: 43 mL/min — ABNORMAL LOW (ref >60)
Glucose, Bld: 141 mg/dL — ABNORMAL HIGH (ref 70–99)
Potassium: 4.5 mmol/L (ref 3.5–5.1)
Sodium: 136 mmol/L (ref 135–145)

## 2021-06-18 LAB — CBC WITH DIFFERENTIAL/PLATELET
Abs Immature Granulocytes: 0.08 10*3/uL — ABNORMAL HIGH (ref 0.00–0.07)
Basophils Absolute: 0 10*3/uL (ref 0.0–0.1)
Basophils Relative: 0 %
Eosinophils Absolute: 0 10*3/uL (ref 0.0–0.5)
Eosinophils Relative: 0 %
HCT: 36 % — ABNORMAL LOW (ref 39.0–52.0)
Hemoglobin: 11.9 g/dL — ABNORMAL LOW (ref 13.0–17.0)
Immature Granulocytes: 1 %
Lymphocytes Relative: 6 %
Lymphs Abs: 0.5 10*3/uL — ABNORMAL LOW (ref 0.7–4.0)
MCH: 27.7 pg (ref 26.0–34.0)
MCHC: 33.1 g/dL (ref 30.0–36.0)
MCV: 83.7 fL (ref 80.0–100.0)
Monocytes Absolute: 0.5 10*3/uL (ref 0.1–1.0)
Monocytes Relative: 6 %
Neutro Abs: 7.6 10*3/uL (ref 1.7–7.7)
Neutrophils Relative %: 87 %
Platelets: 82 10*3/uL — ABNORMAL LOW (ref 150–400)
RBC: 4.3 MIL/uL (ref 4.22–5.81)
RDW: 13.4 % (ref 11.5–15.5)
WBC: 8.8 10*3/uL (ref 4.0–10.5)
nRBC: 0 % (ref 0.0–0.2)

## 2021-06-18 LAB — SURGICAL PCR SCREEN
MRSA, PCR: NEGATIVE
Staphylococcus aureus: POSITIVE — AB

## 2021-06-18 LAB — URINE CULTURE: Culture: <10000 — AB

## 2021-06-18 LAB — GLUCOSE, CAPILLARY
Glucose-Capillary: 116 mg/dL — ABNORMAL HIGH (ref 70–99)
Glucose-Capillary: 208 mg/dL — ABNORMAL HIGH (ref 70–99)
Glucose-Capillary: 197 mg/dL — ABNORMAL HIGH (ref 70–99)
Glucose-Capillary: 179 mg/dL — ABNORMAL HIGH (ref 70–99)

## 2021-06-18 SURGERY — CHOLECYSTECTOMY, ROBOT-ASSISTED, LAPAROSCOPIC
Anesthesia: General | Site: Abdomen

## 2021-06-18 MED ORDER — FENTANYL CITRATE (PF) 100 MCG/2ML IJ SOLN
25.0000 ug | INTRAMUSCULAR | Status: DC | PRN
Start: 2021-06-18 — End: 2021-06-18

## 2021-06-18 MED ORDER — FENTANYL CITRATE (PF) 100 MCG/2ML IJ SOLN
INTRAMUSCULAR | Status: AC
  Filled 2021-06-18: qty 2

## 2021-06-18 MED ORDER — MIDAZOLAM HCL 2 MG/2ML IJ SOLN
INTRAMUSCULAR | Status: AC
  Administered 2021-06-18: 1 mg
  Filled 2021-06-18: qty 2

## 2021-06-18 MED ORDER — LIDOCAINE HCL (PF) 2 % IJ SOLN
INTRAMUSCULAR | Status: AC
  Filled 2021-06-18: qty 5

## 2021-06-18 MED ORDER — ROCURONIUM BROMIDE 100 MG/10ML IV SOLN
INTRAVENOUS | Status: DC | PRN
  Administered 2021-06-18: 30 mg via INTRAVENOUS
  Administered 2021-06-18: 50 mg via INTRAVENOUS
  Administered 2021-06-18: 20 mg via INTRAVENOUS

## 2021-06-18 MED ORDER — SODIUM CHLORIDE 0.9 % IR SOLN
Status: DC | PRN
  Administered 2021-06-18: 1000 mL

## 2021-06-18 MED ORDER — ONDANSETRON HCL 4 MG/2ML IJ SOLN
4.0000 mg | Freq: Once | INTRAMUSCULAR | Status: DC | PRN
Start: 2021-06-18 — End: 2021-06-18

## 2021-06-18 MED ORDER — ONDANSETRON HCL 4 MG/2ML IJ SOLN
INTRAMUSCULAR | Status: DC | PRN
  Administered 2021-06-18: 4 mg via INTRAVENOUS

## 2021-06-18 MED ORDER — FENTANYL CITRATE (PF) 100 MCG/2ML IJ SOLN
INTRAMUSCULAR | Status: DC | PRN
  Administered 2021-06-18 (×3): 25 ug via INTRAVENOUS
  Administered 2021-06-18: 50 ug via INTRAVENOUS
  Administered 2021-06-18 (×3): 25 ug via INTRAVENOUS

## 2021-06-18 MED ORDER — PROPOFOL 10 MG/ML IV BOLUS
INTRAVENOUS | Status: DC | PRN
  Administered 2021-06-18: 80 mg via INTRAVENOUS
  Administered 2021-06-18: 50 mg via INTRAVENOUS

## 2021-06-18 MED ORDER — OXYCODONE HCL 5 MG PO TABS
5.0000 mg | ORAL_TABLET | ORAL | Status: DC | PRN
  Administered 2021-06-19: 5 mg via ORAL
  Filled 2021-06-18 (×3): qty 1

## 2021-06-18 MED ORDER — BUPIVACAINE-EPINEPHRINE (PF) 0.25% -1:200000 IJ SOLN
INTRAMUSCULAR | Status: DC | PRN
  Administered 2021-06-18: 30 mL via PERINEURAL

## 2021-06-18 MED ORDER — DEXAMETHASONE SODIUM PHOSPHATE 10 MG/ML IJ SOLN
INTRAMUSCULAR | Status: AC
  Filled 2021-06-18: qty 1

## 2021-06-18 MED ORDER — PHENYLEPHRINE HCL (PRESSORS) 10 MG/ML IV SOLN
INTRAVENOUS | Status: AC
  Filled 2021-06-18: qty 1

## 2021-06-18 MED ORDER — SUGAMMADEX SODIUM 200 MG/2ML IV SOLN
INTRAVENOUS | Status: DC | PRN
  Administered 2021-06-18: 200 mg via INTRAVENOUS

## 2021-06-18 MED ORDER — HALOPERIDOL LACTATE 5 MG/ML IJ SOLN: 2.0000 mg | Freq: Once | INTRAMUSCULAR | Status: AC

## 2021-06-18 MED ORDER — HALOPERIDOL LACTATE 5 MG/ML IJ SOLN
INTRAMUSCULAR | Status: AC
  Administered 2021-06-18: 2 mg via INTRAVENOUS
  Filled 2021-06-18: qty 1

## 2021-06-18 MED ORDER — PROPOFOL 10 MG/ML IV BOLUS
INTRAVENOUS | Status: AC
  Filled 2021-06-18: qty 20

## 2021-06-18 MED ORDER — ACETAMINOPHEN 500 MG PO TABS
1000.0000 mg | ORAL_TABLET | Freq: Four times a day (QID) | ORAL | Status: DC | PRN
  Filled 2021-06-18: qty 2

## 2021-06-18 MED ORDER — PHENYLEPHRINE HCL (PRESSORS) 10 MG/ML IV SOLN
INTRAVENOUS | Status: DC | PRN
  Administered 2021-06-18 (×2): 200 ug via INTRAVENOUS

## 2021-06-18 MED ORDER — ONDANSETRON HCL 4 MG/2ML IJ SOLN
INTRAMUSCULAR | Status: AC
  Filled 2021-06-18: qty 2

## 2021-06-18 MED ORDER — EPHEDRINE SULFATE 50 MG/ML IJ SOLN
INTRAMUSCULAR | Status: DC | PRN
  Administered 2021-06-18: 10 mg via INTRAVENOUS

## 2021-06-18 MED ORDER — ROCURONIUM BROMIDE 10 MG/ML (PF) SYRINGE
PREFILLED_SYRINGE | INTRAVENOUS | Status: AC
  Filled 2021-06-18: qty 10

## 2021-06-18 MED ORDER — DEXAMETHASONE SODIUM PHOSPHATE 10 MG/ML IJ SOLN
INTRAMUSCULAR | Status: DC | PRN
  Administered 2021-06-18: 10 mg via INTRAVENOUS

## 2021-06-18 MED ORDER — INDOCYANINE GREEN 25 MG IV SOLR
INTRAVENOUS | Status: DC | PRN
  Administered 2021-06-18: 2.5 mg via INTRAVENOUS

## 2021-06-18 MED ORDER — HEMOSTATIC AGENTS (NO CHARGE) OPTIME
TOPICAL | Status: DC | PRN
  Administered 2021-06-18: 1 via TOPICAL

## 2021-06-18 MED ORDER — 0.9 % SODIUM CHLORIDE (POUR BTL) OPTIME
TOPICAL | Status: DC | PRN
  Administered 2021-06-18: 500 mL

## 2021-06-18 MED ORDER — EPHEDRINE 5 MG/ML INJ
INTRAVENOUS | Status: AC
  Filled 2021-06-18: qty 10

## 2021-06-18 MED ORDER — MORPHINE SULFATE (PF) 2 MG/ML IV SOLN: 2.0000 mg | INTRAVENOUS | Status: DC | PRN

## 2021-06-18 SURGICAL SUPPLY — 74 items
ADH SKN CLS APL DERMABOND .7 (GAUZE/BANDAGES/DRESSINGS) ×1
APL PRP STRL LF DISP 70% ISPRP (MISCELLANEOUS) ×1
APL SRG 38 LTWT LNG FL B (MISCELLANEOUS) ×1
APPLICATOR ARISTA FLEXITIP XL (MISCELLANEOUS) ×2 IMPLANT
BAG INFUSER PRESSURE 100CC (MISCELLANEOUS) IMPLANT
BAG SPEC RTRVL LRG 6X4 10 (ENDOMECHANICALS) ×1
BULB RESERV EVAC DRAIN JP 100C (MISCELLANEOUS) ×1 IMPLANT
CANNULA REDUC XI 12-8 STAPL (CANNULA) ×2
CANNULA REDUCER 12-8 DVNC XI (CANNULA) ×1 IMPLANT
CHLORAPREP W/TINT 26 (MISCELLANEOUS) ×2 IMPLANT
CLIP VESOLOCK MED LG 6/CT (CLIP) ×3 IMPLANT
CUP MEDICINE 2OZ PLAST GRAD ST (MISCELLANEOUS) ×2 IMPLANT
DECANTER SPIKE VIAL GLASS SM (MISCELLANEOUS) ×2 IMPLANT
DEFOGGER SCOPE WARMER CLEARIFY (MISCELLANEOUS) ×2 IMPLANT
DERMABOND ADVANCED (GAUZE/BANDAGES/DRESSINGS) ×1
DERMABOND ADVANCED .7 DNX12 (GAUZE/BANDAGES/DRESSINGS) ×1 IMPLANT
DRAIN CHANNEL JP 19F (MISCELLANEOUS) ×2 IMPLANT
DRAPE ARM DVNC X/XI (DISPOSABLE) ×4 IMPLANT
DRAPE COLUMN DVNC XI (DISPOSABLE) ×1 IMPLANT
DRAPE DA VINCI XI ARM (DISPOSABLE) ×8
DRAPE DA VINCI XI COLUMN (DISPOSABLE) ×2
DRSG TEGADERM 4X4.75 (GAUZE/BANDAGES/DRESSINGS) ×1 IMPLANT
ELECT CAUTERY BLADE TIP 2.5 (TIP) ×2
ELECT REM PT RETURN 9FT ADLT (ELECTROSURGICAL) ×2
ELECTRODE CAUTERY BLDE TIP 2.5 (TIP) ×1 IMPLANT
ELECTRODE REM PT RTRN 9FT ADLT (ELECTROSURGICAL) ×1 IMPLANT
GAUZE 4X4 16PLY ~~LOC~~+RFID DBL (SPONGE) ×2 IMPLANT
GAUZE SPONGE 4X4 16PLY XRAY LF (GAUZE/BANDAGES/DRESSINGS) ×1 IMPLANT
GLOVE SURG SYN 7.0 (GLOVE) ×4 IMPLANT
GLOVE SURG SYN 7.0 PF PI (GLOVE) ×2 IMPLANT
GLOVE SURG SYN 7.5  E (GLOVE) ×4
GLOVE SURG SYN 7.5 E (GLOVE) ×2 IMPLANT
GOWN STRL REUS W/ TWL LRG LVL3 (GOWN DISPOSABLE) ×4 IMPLANT
GOWN STRL REUS W/TWL LRG LVL3 (GOWN DISPOSABLE) ×8
HEMOSTAT ARISTA ABSORB 3G PWDR (HEMOSTASIS) ×1 IMPLANT
IRRIGATOR SUCT 8 DISP DVNC XI (IRRIGATION / IRRIGATOR) IMPLANT
IRRIGATOR SUCTION 8MM XI DISP (IRRIGATION / IRRIGATOR) ×2
IV NS 1000ML (IV SOLUTION) ×2
IV NS 1000ML BAXH (IV SOLUTION) IMPLANT
KIT PINK PAD W/HEAD ARE REST (MISCELLANEOUS) ×2 IMPLANT
KIT PINK PAD W/HEAD ARM REST (MISCELLANEOUS) ×1 IMPLANT
LABEL OR SOLS (LABEL) ×2 IMPLANT
MANIFOLD NEPTUNE II (INSTRUMENTS) ×2 IMPLANT
NEEDLE HYPO 22GX1.5 SAFETY (NEEDLE) ×2 IMPLANT
NS IRRIG 500ML POUR BTL (IV SOLUTION) ×2 IMPLANT
OBTURATOR OPTICAL STANDARD 8MM (TROCAR) ×2
OBTURATOR OPTICAL STND 8 DVNC (TROCAR) ×1
OBTURATOR OPTICALSTD 8 DVNC (TROCAR) ×1 IMPLANT
PACK LAP CHOLECYSTECTOMY (MISCELLANEOUS) ×2 IMPLANT
PENCIL ELECTRO HAND CTR (MISCELLANEOUS) ×2 IMPLANT
POUCH SPECIMEN RETRIEVAL 10MM (ENDOMECHANICALS) ×2 IMPLANT
RELOAD STAPLE 45 3.5 BLU DVNC (STAPLE) IMPLANT
RELOAD STAPLER 3.5X45 BLU DVNC (STAPLE) ×1 IMPLANT
SEAL CANN UNIV 5-8 DVNC XI (MISCELLANEOUS) ×3 IMPLANT
SEAL XI 5MM-8MM UNIVERSAL (MISCELLANEOUS) ×6
SET TUBE SMOKE EVAC HIGH FLOW (TUBING) ×2 IMPLANT
SOLUTION ELECTROLUBE (MISCELLANEOUS) ×2 IMPLANT
SPONGE DRAIN TRACH 4X4 STRL 2S (GAUZE/BANDAGES/DRESSINGS) ×2 IMPLANT
SPONGE T-LAP 18X18 ~~LOC~~+RFID (SPONGE) ×1 IMPLANT
SPONGE T-LAP 4X18 ~~LOC~~+RFID (SPONGE) ×2 IMPLANT
STAPLER 45 DA VINCI SURE FORM (STAPLE) ×2
STAPLER 45 SUREFORM DVNC (STAPLE) ×1 IMPLANT
STAPLER CANNULA SEAL DVNC XI (STAPLE) ×1 IMPLANT
STAPLER CANNULA SEAL XI (STAPLE) ×2
STAPLER RELOAD 3.5X45 BLU DVNC (STAPLE) ×1
STAPLER RELOAD 3.5X45 BLUE (STAPLE) ×2
SUT ETHILON 3-0 FS-10 30 BLK (SUTURE) ×2
SUT MNCRL AB 4-0 PS2 18 (SUTURE) ×2 IMPLANT
SUT VIC AB 3-0 SH 27 (SUTURE)
SUT VIC AB 3-0 SH 27X BRD (SUTURE) IMPLANT
SUT VICRYL 0 AB UR-6 (SUTURE) ×4 IMPLANT
SUTURE EHLN 3-0 FS-10 30 BLK (SUTURE) IMPLANT
TAPE TRANSPORE STRL 2 31045 (GAUZE/BANDAGES/DRESSINGS) ×2 IMPLANT
TROCAR BALLN GELPORT 12X130M (ENDOMECHANICALS) ×2 IMPLANT

## 2021-06-18 NOTE — Progress Notes (Signed)
PT Cancellation Note  Patient Details Name: BHAVESH VAZQUEZ MRN: [id number] DOB: 01-Nov-1941   Cancelled Treatment:    Reason Eval/Treat Not Completed: Patient at procedure or test/unavailable.  Chart reviewed.  Upon arrival to pt's room, pt in bed being transported off floor by staff.  Per chart, plan for surgery today.  Will re-attempt PT evaluation at a later date/time as medically appropriate.  Leitha Bleak, PT 06/18/21, 8:56 AM

## 2021-06-18 NOTE — Progress Notes (Signed)
06/18/2021  Subjective: No acute events overnight.  Patient had HIDA scan yesterday which was concerning for possible cholecystitis given the gallbladder did not fill after 90 minutes.  Patient this morning seems more awake and conversational after being very sleepy yesterday.  Denies any abdominal pain at this point.  Vital signs: Temp:  [97.9 F (36.6 C)-99.5 F (37.5 C)] 98.6 F (37 C) (07/17 0520) Pulse Rate:  [98-102] 102 (07/17 0520) Resp:  [16-19] 19 (07/17 0520) BP: (106-118)/(49-60) 118/60 (07/17 0520) SpO2:  [94 %-97 %] 97 % (07/17 0520)   Intake/Output: 07/16 0701 - 07/17 0700 In: -  Out: 100 [Urine:100] Last BM Date: 06/16/21  Physical Exam: Constitutional: No acute distress Abdomen: Soft, nondistended, currently nontender to palpation.  Labs:  Recent Labs    06/17/21 0518 06/18/21 0504  WBC 8.6 8.8  HGB 11.9* 11.9*  HCT 34.7* 36.0*  PLT 97* 82*   Recent Labs    06/17/21 0518 06/18/21 0504  NA 134* 136  K 4.9 4.5  CL 102 103  CO2 23 26  GLUCOSE 214* 141*  BUN 46* 30*  CREATININE 1.53* 1.61*  CALCIUM 8.8* 8.3*   No results for input(s): LABPROT, INR in the last 72 hours.  Imaging: NM Hepatobiliary Liver Func  Result Date: 06/17/2021 CLINICAL DATA:  Concern for acute cholecystitis. RIGHT upper quadrant pain. Distended gallbladder on ultrasound with stones. EXAM: NUCLEAR MEDICINE HEPATOBILIARY IMAGING TECHNIQUE: Sequential images of the abdomen were obtained out to 60 minutes following intravenous administration of radiopharmaceutical. RADIOPHARMACEUTICALS:  5.1 mCi Tc-84m  Choletec IV COMPARISON:  Ultrasound 06/17/2021 FINDINGS: Difficult exam with patient moving during exam. There is prompt clearance of radiotracer from blood pool and homogeneous uptake in the liver. Counts are evident within the common bile duct and small bowel by 15 minutes. The gallbladder fails to fill after 90 minutes of imaging. There are insufficient counts within the liver to  administered morphine. No booster radiotracer dose is available. IMPRESSION: Equivocal exam. Non filling of the gallbladder over 90 minutes. Morphine was not able to be administered to augment filling of the gallbladder. Differential would include acute cholecystitis versus prolonged fasting state. Recommend correlation with last meal. Electronically Signed   By: Suzy Bouchard M.D.   On: 06/17/2021 16:54   US Abdomen Limited RUQ (LIVER/GB)  Result Date: 06/17/2021 CLINICAL DATA:  Right upper quadrant abdominal pain and tenderness for the past 2 weeks. EXAM: ULTRASOUND ABDOMEN LIMITED RIGHT UPPER QUADRANT COMPARISON:  12/09/2016.  Abdomen and pelvis CT dated 06/16/2021. FINDINGS: Gallbladder: Again demonstrated are multiple gallstones in the gallbladder measuring up to 13 mm in maximum diameter each. Mild gallbladder wall thickening and edema, measuring 6.7 mm in maximum thickness. Minimal pericholecystic fluid. No sonographic Murphy sign. Common bile duct: Diameter: 3.2 mm Liver: Diffusely echogenic. No corresponding low density on the recent CT or other findings. Portal vein is patent on color Doppler imaging with normal direction of blood flow towards the liver. Other: None. IMPRESSION: 1. Cholelithiasis and mild gallbladder wall thickening with minimal pericholecystic fluid, possibly represent early changes of acute cholecystitis. 2. Diffusely echogenic liver. This is most likely due to CT occult steatosis. Chronic hepatitis and cirrhosis can also produce this appearance. Electronically Signed   By: Claudie Revering M.D.   On: 06/17/2021 11:57    Assessment/Plan: This is a 80 y.o. male with likely acute cholecystitis.  - Discussed again with his family and the patient that given the initial presentation to the hospital, the findings on his ultrasound,  and now the findings on his HIDA scan, the most likely culprit is his gallbladder in the setting of acute cholecystitis.  Although currently he is  pain-free, my concern is that with an obstructed gallbladder his symptoms will recur relatively quickly and could potentially be worse.  They are in agreement with proceeding with surgery and will be taking him today for robotic cholecystectomy. - Of note, the patient is a [employer] account executive [employer] and the family has clarified that albumin is okay to be given if needed.  Face-to-face time spent with the patient and care providers was 25 minutes, with more than 50% of the time spent counseling, educating, and coordinating care of the patient.      Melvyn Neth, [facility]

## 2021-06-18 NOTE — Op Note (Signed)
Procedure Date:  06/18/2021  Pre-operative Diagnosis:  Acute cholecystitis  Post-operative Diagnosis: Acute cholecystitis  Procedure:  Robotic assisted cholecystectomy with ICG FireFly cholangiogram  Surgeon:  Melvyn Neth, MD  Anesthesia:  General endotracheal  Estimated Blood Loss:  100 ml  Specimens:  gallbladder  Complications:  None  Indications for Procedure:  This is a 80 y.o. male who presents with abdominal pain and workup revealing acute cholecystitis.  The benefits, complications, treatment options, and expected outcomes were discussed with the patient. The risks of bleeding, infection, recurrence of symptoms, failure to resolve symptoms, bile duct damage, bile duct leak, retained common bile duct stone, bowel injury, and need for further procedures were all discussed with the patient and he was willing to proceed.  Description of Procedure: The patient was correctly identified in the preoperative area and brought into the operating room.  The patient was placed supine with VTE prophylaxis in place.  Appropriate time-outs were performed.  Anesthesia was induced and the patient was intubated.  Appropriate antibiotics were infused.  The abdomen was prepped and draped in a sterile fashion. An infraumbilical incision was made. A cutdown technique was used to enter the abdominal cavity without injury, and a 12 mm robotic port was inserted.  Pneumoperitoneum was obtained with appropriate opening pressures.  Three 8-mm ports were placed in the mid abdomen at the level of the umbilicus under direct visualization.  The DaVinci platform was docked, camera targeted, and instruments were placed under direct visualization.  The gallbladder was identified.  It was very inflamed and distended, consistent with acute cholecystitis.  The patient had a prior right colectomy.  Part of the omentum in the RUQ and mesentery were adhered to the gallbladder.  These adhesions were taken down  carefully using cautery and blunt dissection.  This allowed finally better mobilization of the gallbladder.  The gallbladder was too distended to allow graspers, so it was needle decompressed first.  Then, the fundus was grasped and retracted cephalad.  Further adhesions were lysed bluntly and with electrocautery.  As the neck of the gallbladder was further dissected, it was noticeable that the hepatic artery was coursing anterior to the gallbladder going laterally.  This could possibly be an aberrant anatomy.  Firefly cholangiogram was obtained to better evaluate the biliary anatomy, and only the common bile duct was visualized, which is consistent with obstruction of the cystic duct noted on HIDA scan yesterday.  The cystic artery was first identified coming off the hepatic artery and was clipped twice proximally and once distally, cutting in between.  This allowed [place] to mobile the neck of the gallbladder even more, which then allowed identification of the cystic duct.  The cystic duct was also seen coursing laterally rather than medially confirming the different anatomical configuration.  FireFly cholangiogram was tried again, only showing the common bile duct.  However, the cystic duct was identified as the only structure going to the gallbladder.  This was very distended as well, and we used a 45 mm blue load staple load to transect it.  The gallbladder was then taken from the gallbladder fossa in a retrograde fashion with electrocautery. There was bile spillage towards the top of the gallbladder.  The gallbladder was placed in an Endocatch bag. The liver bed was inspected and any bleeding was controlled with electrocautery. The right upper quadrant was then inspected again revealing intact clips and staple line, no bleeding, and no ductal injury.  The area was thoroughly irrigated until clear fluid  return.  Then, 3 gm of Arista powder was sprayed over the gallbladder fossa to help with hemostasis given that  the patient is thrombocytopenic.  The DaVinci platform was then undocked and instruments removed.  A 19 Fr. Blake drain was introduced via the right lateral port and placed in the RUQ and gallbladder fossa.  Then, the 8 mm ports were removed under direct visualization and the 12 mm port was removed.  The Endocatch bag was brought out via the umbilical incision which had to be extended due to the size of the gallbladder. The fascial opening was closed using 0 vicryl sutures.  Local anesthetic was infused in all incisions.  The umbilical incision was closed using 3-0 Vicryl and 4-0 Monocryl, and the remaining port incisions were closed with 4-0 Monocryl.  The drain was secured to the skin using 3-0 Nylon.  The wounds were cleaned and sealed with DermaBond.  The drain site was dressed with 4x4 gauze and TegaDerm.  The patient was emerged from anesthesia and extubated and brought to the recovery room for further management.  The patient tolerated the procedure well and all counts were correct at the end of the case.   Melvyn Neth, MD
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
## 2021-06-18 NOTE — Progress Notes (Signed)
Waking up in PACU, pt trying to get OOB. Confused, combative and unable console.  Dr. Hampton Abbot at bs to reinforce JP drain w/abd pad and tape per son's request so pt would not pull out.  Order obtained from anesthesia for a dose of Haldol 2mg  and if no change, 1mg  Versed.  After versed, pt calmed down and went to sleep.  Vital signs remained stable. Transferred back to pt's room after 30 min and report given.

## 2021-06-18 NOTE — Progress Notes (Signed)
OT Cancellation Note  Patient Details Name: Daniel Sherman MRN: [id number] DOB: [birth year]   Cancelled Treatment:    Reason Eval/Treat Not Completed: Patient at procedure or test/ unavailable. Orders received and chart reviewed. Pt currently off floor. Per chart review, plan for surgery today. OT to evaluate at later date/time as able.   Fredirick Maudlin, OTR/L [place]

## 2021-06-18 NOTE — Transfer of Care (Signed)
Immediate Anesthesia Transfer of Care Note  Patient: Daniel Sherman  Procedure(s) Performed: XI ROBOTIC ASSISTED LAPAROSCOPIC CHOLECYSTECTOMY (Abdomen)  Patient Location: PACU  Anesthesia Type:General  Level of Consciousness: drowsy  Airway & Oxygen Therapy: Patient Spontanous Breathing and Patient connected to face mask oxygen  Post-op Assessment: Report given to RN and Post -op Vital signs reviewed and stable  Post vital signs: Reviewed and stable  Last Vitals:  Vitals Value Taken Time  BP    Temp    Pulse    Resp    SpO2      Last Pain:  Vitals:   06/18/21 0820  TempSrc: Oral  PainSc: 0-No pain         Complications: No notable events documented.

## 2021-06-18 NOTE — Anesthesia Procedure Notes (Signed)
Procedure Name: Intubation Date/Time: 06/18/2021 9:23 AM Performed by: Esaw Grandchild, CRNA Pre-anesthesia Checklist: Patient identified, Emergency Drugs available, Suction available and Patient being monitored Patient Re-evaluated:Patient Re-evaluated prior to induction Oxygen Delivery Method: Circle system utilized Preoxygenation: Pre-oxygenation with 100% oxygen Induction Type: IV induction Ventilation: Mask ventilation without difficulty Laryngoscope Size: Miller and 2 Grade View: Grade I Tube type: Oral Tube size: 7.5 mm Number of attempts: 1 Airway Equipment and Method: Stylet and Oral airway Placement Confirmation: ETT inserted through vocal cords under direct vision, positive ETCO2 and breath sounds checked- equal and bilateral Secured at: 23 cm Tube secured with: Tape Dental Injury: Teeth and Oropharynx as per pre-operative assessment

## 2021-06-18 NOTE — Progress Notes (Signed)
PROGRESS NOTE    Daniel Sherman  WUJ:[id number] DOB: [birth year] DOA: 06/16/2021 PCP: [name], MD    Chief Complaint  Patient presents with   Abdominal Pain   Nausea    Brief Narrative:  80 y.o. Caucasian male with medical history significant for type 2 diabetes mellitus, hypertension, dyslipidemia, GERD, Colon cancer status post right hemicolectomy coronary artery disease and dementia, who presented to the emergency room with acute onset of belching and burping over the last couple of days as well as nausea with associated vomiting/spitting  and abdominal pain mainly in the periumbilical area with a feeling of heartburn that has been going on over the last couple weeks.  CT of the abdomen showed no acute intra-abdominal pathology, cholelithiasis. 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  Will probably need enhanced MRI examination.  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 Stable ununited left ischial fracture. Pt seen and examined at bedside. He remains pain free and slightly nauseated, hungry.    Assessment & Plan:   Active Problems:   Diabetes mellitus without complication (HCC)   Primary thrombocytopenia (HCC)   Essential hypertension   Colon cancer (HCC)   Acute gastroenteritis   Abdominal pain   Nausea   Gastroenteritis   Calculus of gallbladder with acute cholecystitis without obstruction  Recurrent nausea vomiting with abdominal pain suspect possibly from acute gastritis/gastroenteritis. Rule out acute cholecystitis as he was found to have cholelithiasis with some right upper quadrant tenderness. Start the patient on IV Protonix PPI twice daily for 24 hours later changed to 40 mg every 24 hours. Continue with IV antiemetics, gentle  hydration. US showed GB inflammation and pericholecystic fluid , plan for OR today. Appreciate Dr Mont Dutton recommendations.      Cholelithiasis with some mild right upper quadrant tenderness Ultrasound of the abdomen ordered , showed Cholelithiasis and mild gallbladder wall thickening with minimal pericholecystic fluid, possibly represent early changes of acute cholecystitis. Hida scan showed Differential would include acute cholecystitis versus prolonged fasting state. He was started on IV zosyn and plan for OR today.  General surgery consulted by EDP.    Type 2 diabetes mellitus, non-insulin-dependent, CBG (last 3)  Recent Labs    06/17/21 1702 06/17/21 2138 06/18/21 0758  GLUCAP 150* 158* 116*    Continue with sliding scale insulin at this time Obtain hemoglobin A1c.    Essential hypertension Blood pressure parameters are borderline normal.     Mild dementia Patient is on Aricept and Namenda, continue the same.   Hyperlipidemia Continue with Zocor.   History of chronic thrombocytopenia No bleeding evident continue to monitor.   History of colon cancer s/p hemicolectomy CT of the abdomen pelvis showed stable hemicolectomy.   DVT prophylaxis: (Lovenox) Code Status: (Full code) Family Communication: (family at bedside).  Disposition:   Status is: Observation  The patient will require care spanning > 2 midnights and should be moved to inpatient because: Ongoing diagnostic testing needed not appropriate for outpatient work up and IV treatments appropriate due to intensity of illness or inability to take PO  Dispo: The patient is from: Home              Anticipated d/c is to:  pending.               Patient currently is  not medically stable to d/c.   Difficult to place patient No       Consultants:  General surgery.   Procedures: CT abdomen and pelvis US abdomen.   Antimicrobials: none.    Subjective: No abd pain.   Objective: Vitals:    06/17/21 1752 06/17/21 2001 06/18/21 0520 06/18/21 0820  BP: (!) 113/52 (!) 106/49 118/60 (!) 99/51  Pulse: 98 99 (!) 102 93  Resp: 16 19 19 20   Temp: 97.9 F (36.6 C) 99.5 F (37.5 C) 98.6 F (37 C) 97.9 F (36.6 C)  TempSrc:      SpO2: 94% 97% 97% 96%  Weight:      Height:        Intake/Output Summary (Last 24 hours) at 06/18/2021 0959 Last data filed at 06/18/2021 0542 Gross per 24 hour  Intake --  Output 100 ml  Net -100 ml    Filed Weights   06/16/21 1750 06/17/21 0104  Weight: 81.6 kg 85.7 kg    Examination:  General exam: alert and comfortable.  Respiratory system: air entry fair, no wheezing heard.  Cardiovascular system: S1S2, HEARD, RRR, no JVD, no pedal edema.  Gastrointestinal system: Abdomen is soft, NT ND BS+ Central nervous system: Alert and answering questions appropriately.  Extremities: no pedal edema.  Skin: No rashes seen.  Psychiatry: mood is appropriate.     Data Reviewed: I have personally reviewed following labs and imaging studies  CBC: Recent Labs  Lab 06/16/21 1759 06/17/21 0518 06/18/21 0504  WBC 7.9 8.6 8.8  NEUTROABS  --   --  7.6  HGB 13.1 11.9* 11.9*  HCT 39.6 34.7* 36.0*  MCV 84.8 83.8 83.7  PLT 122* 97* 82*     Basic Metabolic Panel: Recent Labs  Lab 06/16/21 1759 06/17/21 0518 06/18/21 0504  NA 133* 134* 136  K 4.7 4.9 4.5  CL 99 102 103  CO2 24 23 26   GLUCOSE 185* 214* 141*  BUN 51* 46* 30*  CREATININE 1.69* 1.53* 1.61*  CALCIUM 9.6 8.8* 8.3*     GFR: Estimated Creatinine Clearance: 36.9 mL/min (A) (by C-G formula based on SCr of 1.61 mg/dL (H)).  Liver Function Tests: Recent Labs  Lab 06/16/21 1759  AST 15  ALT 15  ALKPHOS 70  BILITOT 1.0  PROT 7.7  ALBUMIN 4.5     CBG: Recent Labs  Lab 06/17/21 0831 06/17/21 1239 06/17/21 1702 06/17/21 2138 06/18/21 0758  GLUCAP 170* 142* 150* 158* 116*      Recent Results (from the past 240 hour(s))  Resp Panel by RT-PCR (Flu A&B, Covid)  Nasopharyngeal Swab     Status: None   Collection Time: 06/16/21  9:31 PM   Specimen: Nasopharyngeal Swab; Nasopharyngeal(NP) swabs in vial transport medium  Result Value Ref Range Status   SARS Coronavirus 2 by RT PCR NEGATIVE NEGATIVE Final    Comment: (NOTE) SARS-CoV-2 target nucleic acids are NOT DETECTED.  The SARS-CoV-2 RNA is generally detectable in upper respiratory specimens during the acute phase of infection. The lowest concentration of SARS-CoV-2 viral copies this assay can detect is 138 copies/mL. A negative result does not preclude SARS-Cov-2 infection and should not be used as the sole basis for treatment or other patient management decisions. A negative result may occur with  improper specimen collection/handling, submission of specimen other than nasopharyngeal swab, presence of viral mutation(s) within the areas targeted by this assay, and inadequate number of viral copies(<138 copies/mL). A negative result must be combined with clinical  observations, patient history, and epidemiological information. The expected result is Negative.  Fact Sheet for Patients:  EntrepreneurPulse.com.au  Fact Sheet for Healthcare Providers:  IncredibleEmployment.be  This test is no t yet approved or cleared by the [place] FDA and  has been authorized for detection and/or diagnosis of SARS-CoV-2 by FDA under an Emergency Use Authorization (EUA). This EUA will remain  in effect (meaning this test can be used) for the duration of the COVID-19 declaration under Section 564(b)(1) of the Act, 21 U.S.C.section 360bbb-3(b)(1), unless the authorization is terminated  or revoked sooner.       Influenza A by PCR NEGATIVE NEGATIVE Final   Influenza B by PCR NEGATIVE NEGATIVE Final    Comment: (NOTE) The Xpert Xpress SARS-CoV-2/FLU/RSV plus assay is intended as an aid in the diagnosis of influenza from Nasopharyngeal swab specimens and should not be  used as a sole basis for treatment. Nasal washings and aspirates are unacceptable for Xpert Xpress SARS-CoV-2/FLU/RSV testing.  Fact Sheet for Patients: EntrepreneurPulse.com.au  Fact Sheet for Healthcare Providers: IncredibleEmployment.be  This test is not yet approved or cleared by the [place] FDA and has been authorized for detection and/or diagnosis of SARS-CoV-2 by FDA under an Emergency Use Authorization (EUA). This EUA will remain in effect (meaning this test can be used) for the duration of the COVID-19 declaration under Section 564(b)(1) of the Act, 21 U.S.C. section 360bbb-3(b)(1), unless the authorization is terminated or revoked.  Performed at [facility], [address] [place], [place] [address]           Radiology Studies: CT ABDOMEN PELVIS WO CONTRAST  Result Date: 06/16/2021 CLINICAL DATA:  Nausea, acute nonlocalized abdominal pain EXAM: CT ABDOMEN AND PELVIS WITHOUT CONTRAST TECHNIQUE: Multidetector CT imaging of the abdomen and pelvis was performed following the standard protocol without IV contrast. COMPARISON:  03/21/2021 FINDINGS: Lower chest: The visualized lung bases are clear. Extensive multi-vessel coronary artery calcification is noted. Global cardiac size within normal limits. Hepatobiliary: Cholelithiasis noted without pericholecystic inflammatory change. Liver unremarkable. No intra or extrahepatic biliary ductal dilation. Pancreas: When measured in similar fashion, stable mass within the tail the pancreas measuring 3.5 x 2.5 cm, not well characterized on this examination. The pancreas is otherwise unremarkable. Spleen: Mild splenomegaly is stable with the spleen measuring 14.8 cm in greatest dimension. Adrenals/Urinary Tract: The adrenal glands are unremarkable. The kidneys are normal in size and position. Exophytic 3.6 cm cortical cyst noted arising from the upper pole of the right kidney. Mild  bilateral nonspecific perinephric stranding. No intrarenal or definite ureteral calculi. No hydronephrosis. Streak artifact slightly limits evaluation of the distal ureters and bladder. The visualized bladder is unremarkable. Stomach/Bowel: Moderate descending and sigmoid colonic diverticulosis. Right hemicolectomy has been performed. Stable encapsulated fat within the right lower quadrant may reflect the sequela of omental infarct/fat necrosis and may be postsurgical in nature. The stomach, small bowel, and large bowel are otherwise unremarkable. No free intraperitoneal gas or fluid. Vascular/Lymphatic: Moderate aortoiliac atherosclerotic calcification. Moderate atherosclerotic calcification noted at the origin of the superior mesenteric artery. No aortic aneurysm. No pathologic adenopathy within the abdomen and pelvis. Reproductive: Prostate is unremarkable. Other: No abdominal wall hernia. Musculoskeletal: Bilateral L5 pars defects are identified without associated spondylolisthesis. Mild degenerative changes are seen within the lumbar spine. Left total hip arthroplasty has been performed. There is a mildly displaced anatomically aligned ununited remote fracture of the left ischium, unchanged from prior examination. No acute bone abnormality. IMPRESSION: No acute intra-abdominal pathology  identified. No definite radiographic explanation for the patient's reported symptoms. Extensive coronary artery calcification. Cholelithiasis. 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 While stable since immediate prior examination, this demonstrates slow progressive growth since remote examination of 12/10/2016 and was not present on remote prior examination of 06/09/2014. This could be better assessed with contrast enhanced MRI examination, if clinically indicated. Moderate distal colonic diverticulosis. Stable changes of right hemicolectomy with stable omental infarct/fat  necrosis within the right lower quadrant, possibly postsurgical in nature. Stable ununited left ischial fracture. Aortic Atherosclerosis (ICD10-I70.0). Electronically Signed   By: Fidela Salisbury MD   On: 06/16/2021 19:46   NM Hepatobiliary Liver Func  Result Date: 06/17/2021 CLINICAL DATA:  Concern for acute cholecystitis. RIGHT upper quadrant pain. Distended gallbladder on ultrasound with stones. EXAM: NUCLEAR MEDICINE HEPATOBILIARY IMAGING TECHNIQUE: Sequential images of the abdomen were obtained out to 60 minutes following intravenous administration of radiopharmaceutical. RADIOPHARMACEUTICALS:  5.1 mCi Tc-59m  Choletec IV COMPARISON:  Ultrasound 06/17/2021 FINDINGS: Difficult exam with patient moving during exam. There is prompt clearance of radiotracer from blood pool and homogeneous uptake in the liver. Counts are evident within the common bile duct and small bowel by 15 minutes. The gallbladder fails to fill after 90 minutes of imaging. There are insufficient counts within the liver to administered morphine. No booster radiotracer dose is available. IMPRESSION: Equivocal exam. Non filling of the gallbladder over 90 minutes. Morphine was not able to be administered to augment filling of the gallbladder. Differential would include acute cholecystitis versus prolonged fasting state. Recommend correlation with last meal. Electronically Signed   By: Suzy Bouchard M.D.   On: 06/17/2021 16:54   US Abdomen Limited RUQ (LIVER/GB)  Result Date: 06/17/2021 CLINICAL DATA:  Right upper quadrant abdominal pain and tenderness for the past 2 weeks. EXAM: ULTRASOUND ABDOMEN LIMITED RIGHT UPPER QUADRANT COMPARISON:  12/09/2016.  Abdomen and pelvis CT dated 06/16/2021. FINDINGS: Gallbladder: Again demonstrated are multiple gallstones in the gallbladder measuring up to 13 mm in maximum diameter each. Mild gallbladder wall thickening and edema, measuring 6.7 mm in maximum thickness. Minimal pericholecystic fluid. No  sonographic Murphy sign. Common bile duct: Diameter: 3.2 mm Liver: Diffusely echogenic. No corresponding low density on the recent CT or other findings. Portal vein is patent on color Doppler imaging with normal direction of blood flow towards the liver. Other: None. IMPRESSION: 1. Cholelithiasis and mild gallbladder wall thickening with minimal pericholecystic fluid, possibly represent early changes of acute cholecystitis. 2. Diffusely echogenic liver. This is most likely due to CT occult steatosis. Chronic hepatitis and cirrhosis can also produce this appearance. Electronically Signed   By: Claudie Revering M.D.   On: 06/17/2021 11:57        Scheduled Meds:  [MAR Hold] donepezil  10 mg Oral QHS   [MAR Hold] gabapentin  300 mg Oral QHS   [MAR Hold] haloperidol lactate  2 mg Intravenous Once   indocyanine green  2.5 mg Intravenous 60 min Pre-Op   [MAR Hold] insulin aspart  0-9 Units Subcutaneous TID AC & HS   [MAR Hold] memantine  10 mg Oral Daily   [MAR Hold] pantoprazole (PROTONIX) IV  40 mg Intravenous Q12H   [MAR Hold] simvastatin  40 mg Oral QPM   Continuous Infusions:  sodium chloride 100 mL/hr at 06/18/21 0914   [MAR Hold] piperacillin-tazobactam (ZOSYN)  IV 3.375 g (06/18/21 0521)     LOS: 1 day  Hosie Poisson, MD [facility]   To contact the attending provider between 7A-7P or the covering provider during after hours 7P-7A, please log into the web site [email or web address] and access using universal [place] password for that web site. If you do not have the password, please call the hospital operator.  06/18/2021, 9:59 AM

## 2021-06-19 LAB — BASIC METABOLIC PANEL
Anion gap: 7 (ref 5–15)
BUN: 32 mg/dL — ABNORMAL HIGH (ref 8–23)
CO2: 24 mmol/L (ref 22–32)
Calcium: 7.8 mg/dL — ABNORMAL LOW (ref 8.9–10.3)
Chloride: 103 mmol/L (ref 98–111)
Creatinine, Ser: 1.49 mg/dL — ABNORMAL HIGH (ref 0.61–1.24)
GFR, Estimated: 47 mL/min — ABNORMAL LOW (ref >60)
Glucose, Bld: 148 mg/dL — ABNORMAL HIGH (ref 70–99)
Potassium: 3.8 mmol/L (ref 3.5–5.1)
Sodium: 134 mmol/L — ABNORMAL LOW (ref 135–145)

## 2021-06-19 LAB — HEPATIC FUNCTION PANEL
ALT: 30 U/L (ref 0–44)
AST: 27 U/L (ref 15–41)
Albumin: 2.8 g/dL — ABNORMAL LOW (ref 3.5–5.0)
Alkaline Phosphatase: 41 U/L (ref 38–126)
Bilirubin, Direct: 0.2 mg/dL (ref 0.0–0.2)
Indirect Bilirubin: 0.8 mg/dL (ref 0.3–0.9)
Total Bilirubin: 1 mg/dL (ref 0.3–1.2)
Total Protein: 6.2 g/dL — ABNORMAL LOW (ref 6.5–8.1)

## 2021-06-19 LAB — GLUCOSE, CAPILLARY
Glucose-Capillary: 157 mg/dL — ABNORMAL HIGH (ref 70–99)
Glucose-Capillary: 168 mg/dL — ABNORMAL HIGH (ref 70–99)
Glucose-Capillary: 125 mg/dL — ABNORMAL HIGH (ref 70–99)
Glucose-Capillary: 154 mg/dL — ABNORMAL HIGH (ref 70–99)

## 2021-06-19 LAB — CBC
HCT: 32.8 % — ABNORMAL LOW (ref 39.0–52.0)
Hemoglobin: 11 g/dL — ABNORMAL LOW (ref 13.0–17.0)
MCH: 27.9 pg (ref 26.0–34.0)
MCHC: 33.5 g/dL (ref 30.0–36.0)
MCV: 83.2 fL (ref 80.0–100.0)
Platelets: 81 10*3/uL — ABNORMAL LOW (ref 150–400)
RBC: 3.94 MIL/uL — ABNORMAL LOW (ref 4.22–5.81)
RDW: 13.3 % (ref 11.5–15.5)
WBC: 6.4 10*3/uL (ref 4.0–10.5)
nRBC: 0 % (ref 0.0–0.2)

## 2021-06-19 MED ORDER — PANTOPRAZOLE SODIUM 40 MG PO TBEC
40.0000 mg | DELAYED_RELEASE_TABLET | Freq: Every day | ORAL | Status: DC
  Administered 2021-06-20: 40 mg via ORAL
  Filled 2021-06-19: qty 1

## 2021-06-19 NOTE — Evaluation (Signed)
Occupational Therapy Evaluation Patient Details Name: Daniel Sherman MRN: [id number] DOB: 23-Jun-1941 Today's Date: 06/19/2021    History of Present Illness 80 y.o. Caucasian male with medical history significant for type 2 diabetes mellitus, hypertension, dyslipidemia, GERD, Colon cancer status post right hemicolectomy, coronary artery disease, and dementia. Pt presented to the emergency room with acute onset of belching and burping over the last couple of days as well as nausea with associated vomiting/spitting. S/p cholecystectomy on 06/18/21.   Clinical Impression   Daniel Sherman was seen for OT evaluation this date. Prior to hospital admission, pt was Independent with mobility and ADLs, intermittent cues 2/2 baseline dementia. Pt lives with wife in mobile home c 5 STE and B rails. Pt presents to acute OT demonstrating impaired ADL performance and functional mobility 2/2 decreased activity tolerance, functional strength/ROM/balance deficits, and poor safety awareness. Pt currently requires MAX A don B socks seated EOB - grimacing with attempts at LB access, poor carryover of abdominal pcns education. SBA + si ngle UE support standing grooming tasks. Initial CGA for ADL t/f, pt noted to be reaching for objects and provided RW - improved to SBA + RW for limited in room mobility ~15 ft - assist for lines/leads mgmt.   Pt would benefit from skilled OT to address noted impairments and functional limitations (see below for any additional details) in order to maximize safety and independence while minimizing falls risk and caregiver burden. Upon hospital discharge, recommend HHOT to maximize pt safety and return to functional independence during meaningful occupations of daily life.     Follow Up Recommendations  Home health OT;Other (comment) (Supervision for OOB/mobility)    Equipment Recommendations  3 in 1 bedside commode    Recommendations for Other Services       Precautions / Restrictions  Precautions Precautions: Fall Restrictions Weight Bearing Restrictions: No      Mobility Bed Mobility Overal bed mobility: Needs Assistance Bed Mobility: Supine to Sit     Supine to sit: Min assist     General bed mobility comments: MIN A from flat bed    Transfers Overall transfer level: Needs assistance Equipment used: 1 person hand held assist Transfers: Sit to/from Stand Sit to Stand: Min guard              Balance Overall balance assessment: Needs assistance Sitting-balance support: No upper extremity supported;Feet supported Sitting balance-Leahy Scale: Good     Standing balance support: Single extremity supported;During functional activity Standing balance-Leahy Scale: Fair Standing balance comment: poor balance with no UE support, improved fair with single UE support                           ADL either performed or assessed with clinical judgement   ADL Overall ADL's : Needs assistance/impaired                                       General ADL Comments: MAX A don B socks seated EOB - grimacing with attempts at LB access, poor carryover of abdominal pcns education. SBA + si ngle UE support standing grooming tasks. Initial CGA for ADL t/f, pt noted to be reaching for objects and provided RW - improved to SBA + RW for limited in room mobility ~15 ft - assist for lines/leads mgmt      Pertinent Vitals/Pain Pain Assessment: Faces Faces  Pain Scale: Hurts a little bit Pain Location: abdomen Pain Descriptors / Indicators: Discomfort Pain Intervention(s): Limited activity within patient's tolerance;Repositioned     Hand Dominance Right   Extremity/Trunk Assessment Upper Extremity Assessment Upper Extremity Assessment: Generalized weakness   Lower Extremity Assessment Lower Extremity Assessment: Generalized weakness       Communication Communication Communication: HOH   Cognition Arousal/Alertness: Awake/alert Behavior  During Therapy: WFL for tasks assessed/performed Overall Cognitive Status: History of cognitive impairments - at baseline                                        Exercises Exercises: Other exercises Other Exercises Other Exercises: Pt and family educated re: OT role, DME recs, d/c recs, falls prevention, ECS, abdominal pcns for comfort Other Exercises: LBD, grooming, sup>sit, sit<>stand, sitting/standing balance/toelrance, ~15 ft mobility, self-feeding   Shoulder Instructions      Home Living Family/patient expects to be discharged to:: Private residence Living Arrangements: Spouse/significant other Available Help at Discharge: Family;Available 24 hours/day Type of Home: Mobile home Home Access: Stairs to enter Entrance Stairs-Number of Steps: 5 Entrance Stairs-Rails: Left;Right (wide rails) Home Layout: One level     Bathroom Shower/Tub: Walk-in shower         Home Equipment: Shower seat;Grab bars - tub/shower (walker - unable to state which kind)          Prior Functioning/Environment Level of Independence: Independent        Comments: Pt used walker following prior colon surgery, no AD recently. Wife reports pt Independent with ADLs        OT Problem List: Decreased range of motion;Decreased activity tolerance;Decreased strength;Impaired balance (sitting and/or standing);Decreased safety awareness      OT Treatment/Interventions: Self-care/ADL training;Therapeutic exercise;DME and/or AE instruction;Energy conservation;Therapeutic activities;Patient/family education;Balance training    OT Goals(Current goals can be found in the care plan section) Acute Rehab OT Goals Patient Stated Goal: To go home OT Goal Formulation: With patient/family Time For Goal Achievement: 07/03/21 Potential to Achieve Goals: Good ADL Goals Pt Will Perform Grooming: with modified independence;standing (c LRAD PRN) Pt Will Perform Lower Body Dressing: with modified  independence;sit to/from stand (c LRAD PRN) Pt Will Transfer to Toilet: ambulating;Independently;regular height toilet  OT Frequency: Min 1X/week    AM-PAC OT "6 Clicks" Daily Activity     Outcome Measure Help from another person eating meals?: None Help from another person taking care of personal grooming?: A Little Help from another person toileting, which includes using toliet, bedpan, or urinal?: A Little Help from another person bathing (including washing, rinsing, drying)?: A Little Help from another person to put on and taking off regular upper body clothing?: None Help from another person to put on and taking off regular lower body clothing?: A Little 6 Click Score: 20   End of Session Equipment Utilized During Treatment: Rolling walker  Activity Tolerance: Patient tolerated treatment well Patient left: in chair;with call bell/phone within reach;with chair alarm set;with family/visitor present  OT Visit Diagnosis: Other abnormalities of gait and mobility (R26.89)                Time: 4081-4481 OT Time Calculation (min): 20 min Charges:  OT General Charges $OT Visit: 1 Visit OT Evaluation $OT Eval Low Complexity: 1 Low OT Treatments $Self Care/Home Management : 8-22 mins  Dessie Coma, M.S. OTR/L  06/19/21, 10:34 AM  ascom [phone]]

## 2021-06-19 NOTE — Progress Notes (Signed)
Physical Therapy Treatment Patient Details Name: Daniel Sherman MRN: [id number] DOB: Mar 22, 1941 Today's Date: 06/19/2021    History of Present Illness 80 y.o. Caucasian male with medical history significant for type 2 diabetes mellitus, hypertension, dyslipidemia, GERD, Colon cancer status post right hemicolectomy, coronary artery disease, and dementia. Pt presented to the emergency room with acute onset of belching and burping over the last couple of days as well as nausea with associated vomiting/spitting. S/p cholecystectomy on 06/18/21.    PT Comments    Pt was seen this afternoon to assess stair navigation per request of MD and pt wife at rounds for possible d/c this afternoon. Pt navigated 4 steps x2 reps with CGA for safety and VC on technique and sequencing. Pt wife is concerned about not being able to provide this amount of assist. PT spoke to MD and wife regarding possible advanced planning for son/daughter-in-law to be present when entering/exiting the home. He has progressed with other functional tasks since this morning including improved steadiness and safety during STS, now requiring SUP for 4 reps during this session. Continued CGA during ambulation for safety. Would benefit from skilled PT to address above deficits and promote optimal return to PLOF.   Follow Up Recommendations  Home health PT;Supervision/Assistance - 24 hour (CGA with ambulation)     Equipment Recommendations  Rolling walker with 5" wheels    Recommendations for Other Services       Precautions / Restrictions Precautions Precautions: Fall Restrictions Weight Bearing Restrictions: No    Mobility  Bed Mobility Overal bed mobility: Needs Assistance Bed Mobility: Supine to Sit     Supine to sit: Min assist     General bed mobility comments: Did not assess bed mobility - pt in recliner at start and end of session    Transfers Overall transfer level: Needs assistance Equipment used: Rolling walker  (2 wheeled) Transfers: Sit to/from Stand Sit to Stand: Supervision         General transfer comment: close SUP for safety  Ambulation/Gait Ambulation/Gait assistance: Min guard Gait Distance (Feet): 10 Feet (48ft x 2 reps during ambulatory transfer) Assistive device: Rolling walker (2 wheeled) Gait Pattern/deviations: Step-through pattern;Decreased stride length;Trunk flexed;Narrow base of support Gait velocity: decreased   General Gait Details: VC to remain within RW. Decresed gait velocity. Consistent check-ins to monitor pt pain; after 33ft pt reported an increase in pain.   Stairs Stairs: Yes Stairs assistance: Min guard Stair Management: One rail Right;Step to pattern;Sideways   General stair comments: 4 steps x 2 reps with CGA to steady and for safety. VC on technique with visual demonstration.   Wheelchair Mobility    Modified Rankin (Stroke Patients Only)       Balance Overall balance assessment: Needs assistance Sitting-balance support: No upper extremity supported;Feet supported Sitting balance-Leahy Scale: Good     Standing balance support: Bilateral upper extremity supported;During functional activity Standing balance-Leahy Scale: Fair Standing balance comment: Did not assess without UE support. Forward posture with CGA to steady upon standing.                            Cognition Arousal/Alertness: Awake/alert Behavior During Therapy: WFL for tasks assessed/performed Overall Cognitive Status: History of cognitive impairments - at baseline                                 General Comments: Oriented  to self only.      Exercises Other Exercises Other Exercises: Pt and family educated re: OT role, DME recs, d/c recs, falls prevention, ECS, abdominal pcns for comfort Other Exercises: LBD, grooming, sup>sit, sit<>stand, sitting/standing balance/toelrance, ~15 ft mobility, self-feeding Other Exercises: Ambulation for 16ft with  extensive VC on use of RW for safety. Also VC on pacing and monitoring pain throughout session.    General Comments        Pertinent Vitals/Pain Pain Assessment: No/denies pain Pain Score: 0-No pain Faces Pain Scale: No hurt Pain Location: abdomen Pain Descriptors / Indicators: Discomfort Pain Intervention(s): Limited activity within patient's tolerance;Monitored during session    Home Living Family/patient expects to be discharged to:: Private residence Living Arrangements: Spouse/significant other Available Help at Discharge: Available 24 hours/day;Family Type of Home: Mobile home Home Access: Stairs to enter Entrance Stairs-Rails: Left;Right Home Layout: One level Home Equipment: Shower seat;Grab bars - tub/shower ("walker" - not sure which one, unable to describe)      Prior Function Level of Independence: Independent      Comments: Pt used walker following prior colon surgery, no AD recently. Wife reports pt Independent with ADLs   PT Goals (current goals can now be found in the care plan section) Acute Rehab PT Goals Patient Stated Goal: To go home PT Goal Formulation: With patient/family Time For Goal Achievement: 07/03/21 Potential to Achieve Goals: Good    Frequency    Min 2X/week      PT Plan      Co-evaluation              AM-PAC PT "6 Clicks" Mobility   Outcome Measure  Help needed turning from your back to your side while in a flat bed without using bedrails?: None Help needed moving from lying on your back to sitting on the side of a flat bed without using bedrails?: None Help needed moving to and from a bed to a chair (including a wheelchair)?: A Little Help needed standing up from a chair using your arms (e.g., wheelchair or bedside chair)?: A Little Help needed to walk in hospital room?: A Little Help needed climbing 3-5 steps with a railing? : A Little 6 Click Score: 20    End of Session Equipment Utilized During Treatment: Gait  belt Activity Tolerance: Patient tolerated treatment well;Patient limited by fatigue Patient left: in chair;with chair alarm set;with family/visitor present;with call bell/phone within reach Nurse Communication: Mobility status;Other (comment) (discharge recs) PT Visit Diagnosis: Unsteadiness on feet (R26.81);Other abnormalities of gait and mobility (R26.89);Muscle weakness (generalized) (M62.81)     Time: 1315-1340 PT Time Calculation (min) (ACUTE ONLY): 25 min  Charges:  $Gait Training: 8-22 mins $Therapeutic Activity: 23-37 mins                     Patrina Levering PT, DPT 06/19/21 2:00 PM [phone]    [facility] 06/19/2021, 1:54 PM

## 2021-06-19 NOTE — Progress Notes (Signed)
Met with the patient and family at the bedside to discuss DC plan and needs He needs a rolling walker and 3 in 1, Notified ronda top bring from [employer] to the patient room I set him u with [facility] thru [facility], he has transportation with family,  No additional needs

## 2021-06-19 NOTE — Progress Notes (Signed)
PROGRESS NOTE    Daniel Sherman  DJM:[id number] DOB: 10/25/1941 DOA: 06/16/2021 PCP: [name], MD    Chief Complaint  Patient presents with   Abdominal Pain   Nausea    Brief Narrative:  80 y.o. Caucasian male with medical history significant for type 2 diabetes mellitus, hypertension, dyslipidemia, GERD, Colon cancer status post right hemicolectomy coronary artery disease and dementia, who presented to the emergency room with acute onset of belching and burping over the last couple of days as well as nausea with associated vomiting/spitting  and abdominal pain mainly in the periumbilical area with a feeling of heartburn that has been going on over the last couple weeks.  CT of the abdomen showed no acute intra-abdominal pathology, cholelithiasis. 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  Will probably need enhanced MRI examination.  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 Stable ununited left ischial fracture. Pt seen and examined at bedside. Family reports some bleeding at the site of the drain, they do not want to be discharged today.    Assessment & Plan:   Principal Problem:   Calculus of gallbladder with acute cholecystitis without obstruction Active Problems:   Diabetes mellitus without complication (HCC)   Primary thrombocytopenia (HCC)   Essential hypertension   Colon cancer (HCC)   Abdominal pain   Nausea   Gastroenteritis  Recurrent nausea vomiting with abdominal pain suspect possibly from acute cholecystitis.  Start the patient on IV Protonix PPI twice daily for 24 hours later changed to 40 mg every 24 hours. Continue with IV antiemetics, gentle hydration. US showed GB inflammation and pericholecystic fluid , underwent  robotic  cholecystectomy and a surgical drain put in.     Cholelithiasis with some mild right upper quadrant tenderness Ultrasound of the abdomen ordered , showed Cholelithiasis and mild gallbladder wall thickening with minimal pericholecystic fluid, possibly represent early changes of acute cholecystitis. Hida scan showed Differential would include acute cholecystitis versus prolonged fasting state. He was started on IV zosyn and  underwent  robotic cholecystectomy and a surgical drain put in.  Plan to transition to oral Augmentin on discharge.  Pt and family reports some bleeding at the site of the drain and do nto want to be discharged today.  General surgery recommends outpatient follow up in 5 to 7 days for drain removal.     Type 2 diabetes mellitus, non-insulin-dependent, CBG (last 3)  Recent Labs    06/18/21 2137 06/19/21 0825 06/19/21 1121  GLUCAP 179* 157* 168*    Continue with sliding scale insulin at this time Hemoglobin a1c is 7.5 %    Essential hypertension Blood pressure parameters are optimal.     Mild dementia Patient is on Aricept and Namenda, continue the same.   Hyperlipidemia Continue with Zocor.   History of chronic thrombocytopenia No bleeding evident continue to monitor.   History of colon cancer s/p hemicolectomy CT of the abdomen pelvis showed stable hemicolectomy.   DVT prophylaxis: (Lovenox) Code Status: (Full code) Family Communication: (family at bedside).  Disposition:   Status is: inpatient.   The patient will require care spanning > 2 midnights and should be moved to inpatient because: IV treatments appropriate due to intensity of illness or inability to take PO  Dispo: The patient is from: Home  Anticipated d/c is to:  Home.               Patient currently is not medically stable to d/c.   Difficult to place patient No       Consultants:  General surgery.   Procedures: CT abdomen and pelvis US abdomen.    Antimicrobials: none.    Subjective: No nausea, vomiting, abd pain has improved.  Slghtly confused this am. .   Objective: Vitals:   06/18/21 2345 06/19/21 0429 06/19/21 0823 06/19/21 1530  BP: 125/65 120/62 123/62 125/76  Pulse: 91 80 87 84  Resp: 20 18 16 16   Temp: 98.3 F (36.8 C) 98 F (36.7 C) 98.1 F (36.7 C) 97.8 F (36.6 C)  TempSrc:      SpO2: 93% 94% 93% 95%  Weight:      Height:        Intake/Output Summary (Last 24 hours) at 06/19/2021 1616 Last data filed at 06/19/2021 1300 Gross per 24 hour  Intake 280 ml  Output 225 ml  Net 55 ml    Filed Weights   06/16/21 1750 06/17/21 0104  Weight: 81.6 kg 85.7 kg    Examination:  General exam:  elderly gentleman, not  in distress.  Respiratory system: clear to auscultation, no wheezing heard.  Cardiovascular system: RRR no JVD, no pedal edema.  Gastrointestinal system: Abdomen is soft, mildly tender in the area of the drain, bowel sounds wnl.  Central nervous system: Alert , slightly confused , but able to answer simple questions.  Extremities: no pedal edema.  Skin: No rashes seen.  Psychiatry: cannot be assessed due to confusion.     Data Reviewed: I have personally reviewed following labs and imaging studies  CBC: Recent Labs  Lab 06/16/21 1759 06/17/21 0518 06/18/21 0504 06/19/21 0833  WBC 7.9 8.6 8.8 6.4  NEUTROABS  --   --  7.6  --   HGB 13.1 11.9* 11.9* 11.0*  HCT 39.6 34.7* 36.0* 32.8*  MCV 84.8 83.8 83.7 83.2  PLT 122* 97* 82* 81*     Basic Metabolic Panel: Recent Labs  Lab 06/16/21 1759 06/17/21 0518 06/18/21 0504 06/19/21 0833  NA 133* 134* 136 134*  K 4.7 4.9 4.5 3.8  CL 99 102 103 103  CO2 24 23 26 24   GLUCOSE 185* 214* 141* 148*  BUN 51* 46* 30* 32*  CREATININE 1.69* 1.53* 1.61* 1.49*  CALCIUM 9.6 8.8* 8.3* 7.8*     GFR: Estimated Creatinine Clearance: 39.8 mL/min (A) (by C-G formula based on SCr of 1.49 mg/dL (H)).  Liver Function Tests: Recent Labs  Lab  06/16/21 1759 06/19/21 0833  AST 15 27  ALT 15 30  ALKPHOS 70 41  BILITOT 1.0 1.0  PROT 7.7 6.2*  ALBUMIN 4.5 2.8*     CBG: Recent Labs  Lab 06/18/21 1252 06/18/21 1653 06/18/21 2137 06/19/21 0825 06/19/21 1121  GLUCAP 208* 197* 179* 157* 168*      Recent Results (from the past 240 hour(s))  Urine Culture     Status: Abnormal   Collection Time: 06/16/21  6:07 PM   Specimen: Urine, Clean Catch  Result Value Ref Range Status   Specimen Description   Final    URINE, CLEAN CATCH Performed at [facility], [address], [place], [place] [address]    Special Requests   Final    NONE Performed at [facility], [address], [place], [place] [address]    Culture (A)  Final    <10,000 COLONIES/mL INSIGNIFICANT GROWTH Performed at [facility] [address] [place], [place] [address]    Report Status 06/18/2021 FINAL  Final  Resp Panel by RT-PCR (Flu A&B, Covid) Nasopharyngeal Swab     Status: None   Collection Time: 06/16/21  9:31 PM   Specimen: Nasopharyngeal Swab; Nasopharyngeal(NP) swabs in vial transport medium  Result Value Ref Range Status   SARS Coronavirus 2 by RT PCR NEGATIVE NEGATIVE Final    Comment: (NOTE) SARS-CoV-2 target nucleic acids are NOT DETECTED.  The SARS-CoV-2 RNA is generally detectable in upper respiratory specimens during the acute phase of infection. The lowest concentration of SARS-CoV-2 viral copies this assay can detect is 138 copies/mL. A negative result does not preclude SARS-Cov-2 infection and should not be used as the sole basis for treatment or other patient management decisions. A negative result may occur with  improper specimen collection/handling, submission of specimen other than nasopharyngeal swab, presence of viral mutation(s) within the areas targeted by this assay, and inadequate number of viral copies(<138 copies/mL). A negative result must be combined with clinical observations,  patient history, and epidemiological information. The expected result is Negative.  Fact Sheet for Patients:  EntrepreneurPulse.com.au  Fact Sheet for Healthcare Providers:  IncredibleEmployment.be  This test is no t yet approved or cleared by the [place] FDA and  has been authorized for detection and/or diagnosis of SARS-CoV-2 by FDA under an Emergency Use Authorization (EUA). This EUA will remain  in effect (meaning this test can be used) for the duration of the COVID-19 declaration under Section 564(b)(1) of the Act, 21 U.S.C.section 360bbb-3(b)(1), unless the authorization is terminated  or revoked sooner.       Influenza A by PCR NEGATIVE NEGATIVE Final   Influenza B by PCR NEGATIVE NEGATIVE Final    Comment: (NOTE) The Xpert Xpress SARS-CoV-2/FLU/RSV plus assay is intended as an aid in the diagnosis of influenza from Nasopharyngeal swab specimens and should not be used as a sole basis for treatment. Nasal washings and aspirates are unacceptable for Xpert Xpress SARS-CoV-2/FLU/RSV testing.  Fact Sheet for Patients: EntrepreneurPulse.com.au  Fact Sheet for Healthcare Providers: IncredibleEmployment.be  This test is not yet approved or cleared by the [place] FDA and has been authorized for detection and/or diagnosis of SARS-CoV-2 by FDA under an Emergency Use Authorization (EUA). This EUA will remain in effect (meaning this test can be used) for the duration of the COVID-19 declaration under Section 564(b)(1) of the Act, 21 U.S.C. section 360bbb-3(b)(1), unless the authorization is terminated or revoked.  Performed at [facility], [address] [place], [place] [address]   Surgical pcr screen     Status: Abnormal   Collection Time: 06/18/21  8:28 AM   Specimen: Nasal Mucosa; Nasal Swab  Result Value Ref Range Status   MRSA, PCR NEGATIVE NEGATIVE Final   Staphylococcus  aureus POSITIVE (A) NEGATIVE Final    Comment: (NOTE) The Xpert SA Assay (FDA approved for NASAL specimens in patients 77 years of age and older), is one component of a comprehensive surveillance program. It is not intended to diagnose infection nor to guide or monitor treatment. Performed at [facility], [address], [place], [place] [address]           Radiology Studies: NM Hepatobiliary Liver Func  Result Date: 06/17/2021 CLINICAL DATA:  Concern for acute cholecystitis. RIGHT upper quadrant pain. Distended gallbladder on ultrasound with stones. EXAM: NUCLEAR MEDICINE HEPATOBILIARY IMAGING TECHNIQUE:  Sequential images of the abdomen were obtained out to 60 minutes following intravenous administration of radiopharmaceutical. RADIOPHARMACEUTICALS:  5.1 mCi Tc-34m  Choletec IV COMPARISON:  Ultrasound 06/17/2021 FINDINGS: Difficult exam with patient moving during exam. There is prompt clearance of radiotracer from blood pool and homogeneous uptake in the liver. Counts are evident within the common bile duct and small bowel by 15 minutes. The gallbladder fails to fill after 90 minutes of imaging. There are insufficient counts within the liver to administered morphine. No booster radiotracer dose is available. IMPRESSION: Equivocal exam. Non filling of the gallbladder over 90 minutes. Morphine was not able to be administered to augment filling of the gallbladder. Differential would include acute cholecystitis versus prolonged fasting state. Recommend correlation with last meal. Electronically Signed   By: Suzy Bouchard M.D.   On: 06/17/2021 16:54        Scheduled Meds:  donepezil  10 mg Oral QHS   gabapentin  300 mg Oral QHS   insulin aspart  0-9 Units Subcutaneous TID AC & HS   memantine  10 mg Oral Daily   pantoprazole (PROTONIX) IV  40 mg Intravenous Q12H   simvastatin  40 mg Oral QPM   Continuous Infusions:  sodium chloride 100 mL/hr at 06/19/21 0420    piperacillin-tazobactam (ZOSYN)  IV 3.375 g (06/19/21 1551)     LOS: 2 days        Hosie Poisson, MD [facility]   To contact the attending provider between 7A-7P or the covering provider during after hours 7P-7A, please log into the web site [email or web address] and access using universal [place] password for that web site. If you do not have the password, please call the hospital operator.  06/19/2021, 4:16 PM

## 2021-06-19 NOTE — Evaluation (Signed)
Physical Therapy Evaluation Patient Details Name: Daniel Sherman MRN: [id number] DOB: [birth year] Today's Date: 06/19/2021   History of Present Illness  80 y.o. Caucasian male with medical history significant for type 2 diabetes mellitus, hypertension, dyslipidemia, GERD, Colon cancer status post right hemicolectomy, coronary artery disease, and dementia. Pt presented to the emergency room with acute onset of belching and burping over the last couple of days as well as nausea with associated vomiting/spitting. S/p cholecystectomy on 06/18/21.  Clinical Impression  Pt admitted with above diagnosis. Prior to hospitalization, he was independent with mobility; occasional assist from wife 2/2 baseline dementia. He was received sitting upright in the recliner with his wife present; agreeable to therapy. BLE strength, ROM and sensation are WFL. Functional mobility including transfers and ambulation require CGA to steady and increased VC for safety. He was able to ambulate 198ft CGA using RW with an increase in pain from 5/10 (resting) to 7/10, RN notified. Pt and wife are concerned about 5 STE home; PT to assess prior to d/c. Wife is available 24/7 to assist as needed at home. Patient's performance this date reveals decreased independence, safety and and tolerance in performing basic mobility required to perform ADL's. Pt requires additional DME, close physical assistance, and cues for safe participate in mobility. Pt will benefit from skilled PT intervention to increase independence and safety with basic mobility in preparation for discharge to the venue listed below.    Follow Up Recommendations Home health PT    Equipment Recommendations  Rolling walker with 5" wheels    Recommendations for Other Services       Precautions / Restrictions Precautions Precautions: Fall Restrictions Weight Bearing Restrictions: No      Mobility  Bed Mobility Overal bed mobility: Needs Assistance Bed Mobility:  Supine to Sit     Supine to sit: Min assist     General bed mobility comments: Did not assess bed mobility - pt in recliner at start and end of session    Transfers Overall transfer level: Needs assistance Equipment used: Rolling walker (2 wheeled) Transfers: Sit to/from Stand Sit to Stand: Min guard         General transfer comment: CGA to steady, VC on hand placement  Ambulation/Gait Ambulation/Gait assistance: Min guard Gait Distance (Feet): 180 Feet Assistive device: Rolling walker (2 wheeled) Gait Pattern/deviations: Step-through pattern;Decreased stride length;Trunk flexed;Narrow base of support Gait velocity: decreased   General Gait Details: VC to remain within RW. Decresed gait velocity. Consistent check-ins to monitor pt pain; after 65ft pt reported an increase in pain.  Stairs            Wheelchair Mobility    Modified Rankin (Stroke Patients Only)       Balance Overall balance assessment: Needs assistance Sitting-balance support: No upper extremity supported;Feet supported Sitting balance-Leahy Scale: Good     Standing balance support: Bilateral upper extremity supported;During functional activity Standing balance-Leahy Scale: Fair Standing balance comment: Did not assess without UE support. Forward posture with CGA to steady upon standing.                             Pertinent Vitals/Pain Pain Assessment: 0-10 Pain Score: 5  Faces Pain Scale: Hurts a little bit Pain Location: abdomen Pain Descriptors / Indicators: Discomfort Pain Intervention(s): Limited activity within patient's tolerance;Monitored during session    [place] expects to be discharged to:: Private residence Living Arrangements: Spouse/significant other Available Help at  Discharge: Available 24 hours/day;Family Type of Home: Mobile home Home Access: Stairs to enter Entrance Stairs-Rails: Chemical engineer of Steps: 5 Home  Layout: One level Home Equipment: Shower seat;Grab bars - tub/shower ("walker" - not sure which one, unable to describe)      Prior Function Level of Independence: Independent         Comments: Pt used walker following prior colon surgery, no AD recently. Wife reports pt Independent with ADLs     Hand Dominance   Dominant Hand: Right    Extremity/Trunk Assessment   Upper Extremity Assessment Upper Extremity Assessment: Generalized weakness    Lower Extremity Assessment Lower Extremity Assessment: Overall WFL for tasks assessed (4+/5 throughout BLE)       Communication   Communication: HOH  Cognition Arousal/Alertness: Awake/alert Behavior During Therapy: WFL for tasks assessed/performed Overall Cognitive Status: History of cognitive impairments - at baseline                                 General Comments: Oriented to self only.      General Comments      Exercises Other Exercises Other Exercises: Pt and family educated re: OT role, DME recs, d/c recs, falls prevention, ECS, abdominal pcns for comfort Other Exercises: LBD, grooming, sup>sit, sit<>stand, sitting/standing balance/toelrance, ~15 ft mobility, self-feeding Other Exercises: Ambulation for 153ft with extensive VC on use of RW for safety. Also VC on pacing and monitoring pain throughout session.   Assessment/Plan    PT Assessment Patient needs continued PT services  PT Problem List Decreased strength;Decreased mobility;Decreased safety awareness;Decreased knowledge of precautions;Decreased activity tolerance;Decreased balance;Decreased knowledge of use of DME;Pain       PT Treatment Interventions Therapeutic activities;Gait training;Therapeutic exercise;Patient/family education;Stair training;Balance training;Functional mobility training;Neuromuscular re-education;Manual techniques    PT Goals (Current goals can be found in the Care Plan section)  Acute Rehab PT Goals Patient Stated  Goal: To go home; be able to go up steps PT Goal Formulation: With patient/family Time For Goal Achievement: 07/03/21 Potential to Achieve Goals: Good    Frequency Min 2X/week   Barriers to discharge   Pt and wife concerned about 5 STE home - PT will assess    Co-evaluation               AM-PAC PT "6 Clicks" Mobility  Outcome Measure Help needed turning from your back to your side while in a flat bed without using bedrails?: None Help needed moving from lying on your back to sitting on the side of a flat bed without using bedrails?: None Help needed moving to and from a bed to a chair (including a wheelchair)?: A Little Help needed standing up from a chair using your arms (e.g., wheelchair or bedside chair)?: A Little Help needed to walk in hospital room?: A Little Help needed climbing 3-5 steps with a railing? : A Little 6 Click Score: 20    End of Session Equipment Utilized During Treatment: Gait belt Activity Tolerance: Patient tolerated treatment well;Patient limited by pain Patient left: in chair;with chair alarm set;with family/visitor present;with call bell/phone within reach Nurse Communication: Mobility status PT Visit Diagnosis: Unsteadiness on feet (R26.81);Other abnormalities of gait and mobility (R26.89);Muscle weakness (generalized) (M62.81)    Time: 1010-1032 PT Time Calculation (min) (ACUTE ONLY): 22 min   Charges:   PT Evaluation $PT Eval Low Complexity: 1 Low PT Treatments $Gait Training: 8-22 mins  Patrina Levering PT, DPT 06/19/21 11:46 AM [phone]   Ramonita Lab 06/19/2021, 11:46 AM

## 2021-06-19 NOTE — Progress Notes (Signed)
[place] Hospital Day(s): 2.   Post op day(s): 1 Day Post-Op.   Interval History:  Patient seen and examined No acute events or new complaints overnight.  Patient reports he is feeling well this morning; he is having some incisional soreness No fever, chills, nausea, emesis Wife is at bedside; corroborates history given his dementia history No new labs this morning Surgical drain with 110 ccs out; serous He has been on CLD post-operatively; tolerating well Has not ambulated or been OOB since surgery  Vital signs in last 24 hours: [min-max] current  Temp:  [97.1 F (36.2 C)-99.2 F (37.3 C)] 98 F (36.7 C) (07/18 0429) Pulse Rate:  [80-107] 80 (07/18 0429) Resp:  [18-24] 18 (07/18 0429) BP: (99-134)/(51-76) 120/62 (07/18 0429) SpO2:  [93 %-100 %] 94 % (07/18 0429)     Height: 5\' 5"  (165.1 cm) Weight: 85.7 kg BMI (Calculated): 31.45   Intake/Output last 2 shifts:  07/17 0701 - 07/18 0700 In: 1428.4 [P.O.:280; I.V.:1000; IV Piggyback:148.4] Out: 310 [Urine:100; Drains:110; Blood:100]   Physical Exam:  Constitutional: alert, cooperative and no distress  Respiratory: breathing non-labored at rest  Cardiovascular: regular rate and sinus rhythm  Gastrointestinal: Soft, mild incisional soreness, non-distended, no rebound/guarding. Surgical drain in right abdomen, output serous Integumentary: Laparoscopic incisions are CDI with dermabond, no erythema or drainage   Labs:  CBC Latest Ref Rng & Units 06/18/2021 06/17/2021 06/16/2021  WBC 4.0 - 10.5 K/uL 8.8 8.6 7.9  Hemoglobin 13.0 - 17.0 g/dL 11.9(L) 11.9(L) 13.1  Hematocrit 39.0 - 52.0 % 36.0(L) 34.7(L) 39.6  Platelets 150 - 400 K/uL 82(L) 97(L) 122(L)   CMP Latest Ref Rng & Units 06/18/2021 06/17/2021 06/16/2021  Glucose 70 - 99 mg/dL 141(H) 214(H) 185(H)  BUN 8 - 23 mg/dL 30(H) 46(H) 51(H)  Creatinine 0.61 - 1.24 mg/dL 1.61(H) 1.53(H) 1.69(H)  Sodium 135 - 145 mmol/L 136 134(L) 133(L)   Potassium 3.5 - 5.1 mmol/L 4.5 4.9 4.7  Chloride 98 - 111 mmol/L 103 102 99  CO2 22 - 32 mmol/L 26 23 24   Calcium 8.9 - 10.3 mg/dL 8.3(L) 8.8(L) 9.6  Total Protein 6.5 - 8.1 g/dL - - 7.7  Total Bilirubin 0.3 - 1.2 mg/dL - - 1.0  Alkaline Phos 38 - 126 U/L - - 70  AST 15 - 41 U/L - - 15  ALT 0 - 44 U/L - - 15     Imaging studies: No new pertinent imaging studies   Assessment/Plan:  80 y.o. male doing well 1 Day Post-Op s/p robotic assisted laparoscopic cholecystectomy for cholecystitis   - Will advance diet as tolerated   - Continue IV Abx (Zosyn); will need 7 days Augmentin PO BID for home   - Continue surgical drain; monitor and record output; he will go home with this; handouts/teaching provided    - Monitor abdominal examination; on-going bowel function - Pain control prn; antiemetics prn - Mobilization as tolerated; okay to work with therapies - Further management per primary service  - Discharge Planning: Okay for discharge from surgical standpoint once tolerating diet. Wife would like patient to ambulate before discharge, which is reasonable. Abx as above, drain as above. I will update discharge instruction. He will need follow up in 5-7 days for drain removal.   All of the above findings and recommendations were discussed with the patient, patient's family (wife at bedside), and the medical team, and all of their questions were answered to their expressed satisfaction.  -- Alroy Dust  Freda Jackson [facility] 06/19/2021, 7:33 AM [phone] M-F: 7am - 4pm

## 2021-06-20 LAB — SURGICAL PATHOLOGY

## 2021-06-20 LAB — GLUCOSE, CAPILLARY: Glucose-Capillary: 132 mg/dL — ABNORMAL HIGH (ref 70–99)

## 2021-06-20 MED ORDER — AMOXICILLIN-POT CLAVULANATE 875-125 MG PO TABS: 1.0000 | ORAL_TABLET | Freq: Two times a day (BID) | ORAL | 0 refills | Status: AC

## 2021-06-20 NOTE — Plan of Care (Signed)
  Problem: Education: Goal: Knowledge of General Education information will improve Description: Including pain rating scale, medication(s)/side effects and non-pharmacologic comfort measures 06/20/2021 1013 by Orvan Seen, RN Outcome: Completed/Met 06/20/2021 0958 by Orvan Seen, RN Outcome: Progressing   Problem: Health Behavior/Discharge Planning: Goal: Ability to manage health-related needs will improve 06/20/2021 1013 by Orvan Seen, RN Outcome: Completed/Met 06/20/2021 0958 by Orvan Seen, RN Outcome: Progressing   Problem: Clinical Measurements: Goal: Ability to maintain clinical measurements within normal limits will improve 06/20/2021 1013 by Orvan Seen, RN Outcome: Completed/Met 06/20/2021 0958 by Orvan Seen, RN Outcome: Progressing Goal: Will remain free from infection 06/20/2021 1013 by Orvan Seen, RN Outcome: Completed/Met 06/20/2021 0958 by Orvan Seen, RN Outcome: Progressing Goal: Diagnostic test results will improve 06/20/2021 1013 by Orvan Seen, RN Outcome: Completed/Met 06/20/2021 0958 by Orvan Seen, RN Outcome: Progressing Goal: Respiratory complications will improve 06/20/2021 1013 by Orvan Seen, RN Outcome: Completed/Met 06/20/2021 0958 by Orvan Seen, RN Outcome: Progressing Goal: Cardiovascular complication will be avoided 06/20/2021 1013 by Orvan Seen, RN Outcome: Completed/Met 06/20/2021 0958 by Orvan Seen, RN Outcome: Progressing   Problem: Activity: Goal: Risk for activity intolerance will decrease 06/20/2021 1013 by Orvan Seen, RN Outcome: Completed/Met 06/20/2021 0958 by Orvan Seen, RN Outcome: Progressing   Problem: Nutrition: Goal: Adequate nutrition will be maintained 06/20/2021 1013 by Orvan Seen, RN Outcome: Completed/Met 06/20/2021 0958 by Orvan Seen, RN Outcome: Progressing   Problem: Coping: Goal: Level of anxiety will decrease 06/20/2021  1013 by Orvan Seen, RN Outcome: Completed/Met 06/20/2021 0958 by Orvan Seen, RN Outcome: Progressing   Problem: Elimination: Goal: Will not experience complications related to bowel motility 06/20/2021 1013 by Orvan Seen, RN Outcome: Completed/Met 06/20/2021 0958 by Orvan Seen, RN Outcome: Progressing Goal: Will not experience complications related to urinary retention 06/20/2021 1013 by Orvan Seen, RN Outcome: Completed/Met 06/20/2021 0958 by Orvan Seen, RN Outcome: Progressing   Problem: Pain Managment: Goal: General experience of comfort will improve 06/20/2021 1013 by Orvan Seen, RN Outcome: Completed/Met 06/20/2021 0958 by Orvan Seen, RN Outcome: Progressing   Problem: Safety: Goal: Ability to remain free from injury will improve 06/20/2021 1013 by Orvan Seen, RN Outcome: Completed/Met 06/20/2021 0958 by Orvan Seen, RN Outcome: Progressing   Problem: Skin Integrity: Goal: Risk for impaired skin integrity will decrease 06/20/2021 1013 by Orvan Seen, RN Outcome: Completed/Met 06/20/2021 0958 by Orvan Seen, RN Outcome: Progressing

## 2021-06-20 NOTE — Progress Notes (Signed)
[place] Hospital Day(s): 3.   Post op day(s): 2 Days Post-Op.   Interval History:  Patient seen and examined No acute events or new complaints overnight.  Patient reports he is doing well No abdominal pain, nausea, emesis, fever No new labs this morning Surgical drain with 900 ccs recorded; suspect this is entered in error; minimal serous output He has tolerated advancement of diet Worked with therapies yesterday; recommending HHPT  Vital signs in last 24 hours: [min-max] current  Temp:  [97.6 F (36.4 C)-98.2 F (36.8 C)] 98.2 F (36.8 C) (07/19 0553) Pulse Rate:  [81-87] 81 (07/19 0553) Resp:  [16-19] 18 (07/19 0553) BP: (102-129)/(55-82) 129/55 (07/19 0553) SpO2:  [93 %-95 %] 94 % (07/19 0553)     Height: 5\' 5"  (165.1 cm) Weight: 85.7 kg BMI (Calculated): 31.45   Intake/Output last 2 shifts:  07/18 0701 - 07/19 0700 In: -  Out: 915 [Drains:915]   Physical Exam:  Constitutional: alert, cooperative and no distress Respiratory: breathing non-labored at rest Cardiovascular: regular rate and sinus rhythm Gastrointestinal: Soft, mild incisional soreness, non-distended, no rebound/guarding. Surgical drain in right abdomen, output serous Integumentary: Laparoscopic incisions are CDI with dermabond, no erythema or drainage   Labs:  CBC Latest Ref Rng & Units 06/19/2021 06/18/2021 06/17/2021  WBC 4.0 - 10.5 K/uL 6.4 8.8 8.6  Hemoglobin 13.0 - 17.0 g/dL 11.0(L) 11.9(L) 11.9(L)  Hematocrit 39.0 - 52.0 % 32.8(L) 36.0(L) 34.7(L)  Platelets 150 - 400 K/uL 81(L) 82(L) 97(L)   CMP Latest Ref Rng & Units 06/19/2021 06/18/2021 06/17/2021  Glucose 70 - 99 mg/dL 148(H) 141(H) 214(H)  BUN 8 - 23 mg/dL 32(H) 30(H) 46(H)  Creatinine 0.61 - 1.24 mg/dL 1.49(H) 1.61(H) 1.53(H)  Sodium 135 - 145 mmol/L 134(L) 136 134(L)  Potassium 3.5 - 5.1 mmol/L 3.8 4.5 4.9  Chloride 98 - 111 mmol/L 103 103 102  CO2 22 - 32 mmol/L 24 26 23   Calcium 8.9 - 10.3  mg/dL 7.8(L) 8.3(L) 8.8(L)  Total Protein 6.5 - 8.1 g/dL 6.2(L) - -  Total Bilirubin 0.3 - 1.2 mg/dL 1.0 - -  Alkaline Phos 38 - 126 U/L 41 - -  AST 15 - 41 U/L 27 - -  ALT 0 - 44 U/L 30 - -     Imaging studies: No new pertinent imaging studies   Assessment/Plan:  80 y.o. male 2 Days Post-Op s/p robotic assisted laparoscopic cholecystectomy for cholecystitis   - Continue diet as tolerated   - Continue IV Abx (Zosyn); will need 7 days Augmentin PO BID for home              - Continue surgical drain; monitor and record output; he will go home with this; handouts/teaching provided               - Monitor abdominal examination; on-going bowel function - Pain control prn; antiemetics prn - Mobilization as tolerated; worked with PT; HHPT - Further management per primary service     - Discharge Planning: Okay for discharge from surgical standpoint, ABX as above, he will go home with drain. I will see him on Friday for drain removal.   All of the above findings and recommendations were discussed with the patient, patient's family(wife), and the medical team, and all of patient's and family's questions were answered to their expressed satisfaction.  -- Edison Simon, PA-C [facility] 06/20/2021, 7:02 AM [phone] M-F: 7am - 4pm

## 2021-06-20 NOTE — Progress Notes (Signed)
Patient discharged to home with wife wheeled out of unit by transport with all belongings.  VSS.  No complaints of pain.  Medications and discharged instructions reviewed with patient, wife, son and daughter-in-law.  All questions answered.  PIV x 1 removed, no bleeding, intact.  Patient and wife verbalized understanding of signs and symptoms of infection.  Patient and caregiver (wife) agreed to follow up with all appointments as listed on AVS.  Patient and caregiver satisfied with overall care at [facility].

## 2021-06-20 NOTE — Discharge Summary (Signed)
Physician Discharge Summary  Daniel Sherman [facility] TFT:[id number] DOB: [birth year] DOA: 06/16/2021  PCP: [name], MD  Admit date: 06/16/2021 Discharge date: 06/20/2021  Admitted From: Home  Disposition:  Home.   Recommendations for Outpatient Follow-up:  Follow up with PCP in 1-2 weeks Please obtain BMP/CBC in one week Please follow up with surgery as recommended on Friday.  Recommend outpatient follow up with MRI of the abdomen for the evaluation of pancreatic tail mass   Home Health:yes   Discharge Condition:stable. CODE STATUS:full code.  Diet recommendation: Heart Healthy    Brief/Interim Summary: 80 y.o. Caucasian male with medical history significant for type 2 diabetes mellitus, hypertension, dyslipidemia, GERD, Colon cancer status post right hemicolectomy coronary artery disease and dementia, who presented to the emergency room with acute onset of belching and burping over the last couple of days as well as nausea with associated vomiting/spitting  and abdominal pain mainly in the periumbilical area with a feeling of heartburn that has been going on over the last couple weeks. CT of the abdomen showed no acute intra-abdominal pathology, cholelithiasis. 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  Will probably need enhanced MRI examination.  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 Stable ununited left ischial fracture.  Discharge Diagnoses:  Principal Problem:   Calculus of gallbladder with acute cholecystitis without obstruction Active Problems:   Diabetes mellitus without complication (HCC)   Primary thrombocytopenia (HCC)   Essential hypertension   Colon cancer (HCC)   Abdominal pain   Nausea   Gastroenteritis    Recurrent nausea vomiting with  abdominal pain suspect possibly from acute cholecystitis.  Start the patient on IV Protonix PPI twice daily for 24 hours later changed to 40 mg every 24 hours. Continue with IV antiemetics, gentle hydration. US showed GB inflammation and pericholecystic fluid , underwent  robotic cholecystectomy and a surgical drain put in.       Cholelithiasis with some mild right upper quadrant tenderness Ultrasound of the abdomen ordered , showed Cholelithiasis and mild gallbladder wall thickening with minimal pericholecystic fluid, possibly represent early changes of acute cholecystitis. Hida scan showed Differential would include acute cholecystitis versus prolonged fasting state. He was started on IV zosyn and  underwent  robotic cholecystectomy and a surgical drain put in.  Plan to transition to oral Augmentin on discharge. General surgery recommends outpatient follow up in 5 to 7 days for drain removal.        Type 2 diabetes mellitus, non-insulin-dependent, Resume home meds on discharge.  Hemoglobin a1c is 7.5 %       Essential hypertension Blood pressure parameters are optimal.        Mild dementia Patient is on Aricept and Namenda, continue the same.     Hyperlipidemia Continue with Zocor.     History of chronic thrombocytopenia No bleeding evident continue to monitor.     History of colon cancer s/p hemicolectomy CT of the abdomen pelvis showed stable hemicolectomy.    Discharge Instructions  Discharge Instructions     Diet - low sodium heart healthy   Complete by: As directed    Discharge wound care:   Complete by: As directed    As per general sugery, recommend outpatient follow up for drain removal.      Allergies as of 06/20/2021  Reactions   Other    BLOOD PRODUCT REFUSAL        Medication List     STOP taking these medications    amitriptyline 25 MG tablet Commonly known as: ELAVIL   aspirin 81 MG tablet   ferrous sulfate 325 (65 FE) MG  EC tablet       TAKE these medications    amoxicillin-clavulanate 875-125 MG tablet Commonly known as: Augmentin Take 1 tablet by mouth every 12 (twelve) hours for 7 days.   carboxymethylcellulose 0.5 % Soln Commonly known as: REFRESH PLUS Place 1 drop into both eyes 2 (two) times daily as needed (dry eyes).   donepezil 10 MG tablet Commonly known as: ARICEPT Take 10 mg by mouth daily.   gabapentin 300 MG capsule Commonly known as: NEURONTIN Take 300 mg by mouth at bedtime.   HUMULIN 70/30 [place] Inject 10 Units into the skin 2 (two) times daily.   hydrochlorothiazide 12.5 MG tablet Commonly known as: HYDRODIURIL Take 12.5 mg by mouth every evening.   lisinopril 40 MG tablet Commonly known as: ZESTRIL Take 40 mg by mouth every evening.   memantine 10 MG tablet Commonly known as: NAMENDA Take 10 mg by mouth daily.   metFORMIN 1000 MG tablet Commonly known as: GLUCOPHAGE Take 1,000 mg by mouth 2 (two) times daily.   omeprazole 40 MG capsule Commonly known as: PRILOSEC Take 40 mg by mouth daily.   simvastatin 40 MG tablet Commonly known as: ZOCOR Take 40 mg by mouth every evening.   VITAMIN B-12 IJ Inject 1,000 mcg as directed every 30 (thirty) days.               Durable Medical Equipment  (From admission, onward)           Start     Ordered   06/19/21 1450  For home use only DME Walker rolling  Once       Question Answer Comment  Walker: With [place] Wheels   Patient needs a walker to treat with the following condition Weakness      06/19/21 1449              Discharge Care Instructions  (From admission, onward)           Start     Ordered   06/20/21 0000  Discharge wound care:       Comments: As per general sugery, recommend outpatient follow up for drain removal.   06/20/21 1005            Follow-up Information     [name], MD. Schedule an appointment as soon as possible for a visit .   Specialty: Family  Medicine Why: Office will call Patient for Appt. Contact information: [place] [address] [phone]         Tylene Fantasia, PA-C. Go on 06/23/2021.   Specialty: Physician Assistant Why: s/p lap cholecystectomy, with drain;  Appt @ 11:00 am Contact information: [address] [place] [address] [phone]         [name], MD .   Specialty: Family Medicine Contact information: [address] [place] [address] [phone]                Allergies  Allergen Reactions   Other     BLOOD PRODUCT REFUSAL    Consultations: General surgery.    Procedures/Studies: CT ABDOMEN PELVIS WO CONTRAST  Result Date:  06/16/2021 CLINICAL DATA:  Nausea, acute nonlocalized abdominal pain EXAM: CT ABDOMEN AND PELVIS WITHOUT CONTRAST TECHNIQUE: Multidetector CT imaging of the abdomen and pelvis was performed following the standard protocol without IV contrast. COMPARISON:  03/21/2021 FINDINGS: Lower chest: The visualized lung bases are clear. Extensive multi-vessel coronary artery calcification is noted. Global cardiac size within normal limits. Hepatobiliary: Cholelithiasis noted without pericholecystic inflammatory change. Liver unremarkable. No intra or extrahepatic biliary ductal dilation. Pancreas: When measured in similar fashion, stable mass within the tail the pancreas measuring 3.5 x 2.5 cm, not well characterized on this examination. The pancreas is otherwise unremarkable. Spleen: Mild splenomegaly is stable with the spleen measuring 14.8 cm in greatest dimension. Adrenals/Urinary Tract: The adrenal glands are unremarkable. The kidneys are normal in size and position. Exophytic 3.6 cm cortical cyst noted arising from the upper pole of the right kidney. Mild bilateral nonspecific perinephric stranding. No intrarenal or definite ureteral calculi. No hydronephrosis. Streak artifact slightly limits evaluation of the  distal ureters and bladder. The visualized bladder is unremarkable. Stomach/Bowel: Moderate descending and sigmoid colonic diverticulosis. Right hemicolectomy has been performed. Stable encapsulated fat within the right lower quadrant may reflect the sequela of omental infarct/fat necrosis and may be postsurgical in nature. The stomach, small bowel, and large bowel are otherwise unremarkable. No free intraperitoneal gas or fluid. Vascular/Lymphatic: Moderate aortoiliac atherosclerotic calcification. Moderate atherosclerotic calcification noted at the origin of the superior mesenteric artery. No aortic aneurysm. No pathologic adenopathy within the abdomen and pelvis. Reproductive: Prostate is unremarkable. Other: No abdominal wall hernia. Musculoskeletal: Bilateral L5 pars defects are identified without associated spondylolisthesis. Mild degenerative changes are seen within the lumbar spine. Left total hip arthroplasty has been performed. There is a mildly displaced anatomically aligned ununited remote fracture of the left ischium, unchanged from prior examination. No acute bone abnormality. IMPRESSION: No acute intra-abdominal pathology identified. No definite radiographic explanation for the patient's reported symptoms. Extensive coronary artery calcification. Cholelithiasis. 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 While stable since immediate prior examination, this demonstrates slow progressive growth since remote examination of 12/10/2016 and was not present on remote prior examination of 06/09/2014. This could be better assessed with contrast enhanced MRI examination, if clinically indicated. Moderate distal colonic diverticulosis. Stable changes of right hemicolectomy with stable omental infarct/fat necrosis within the right lower quadrant, possibly postsurgical in nature. Stable ununited left ischial fracture. Aortic Atherosclerosis (ICD10-I70.0).  Electronically Signed   By: Fidela Salisbury MD   On: 06/16/2021 19:46   NM Hepatobiliary Liver Func  Result Date: 06/17/2021 CLINICAL DATA:  Concern for acute cholecystitis. RIGHT upper quadrant pain. Distended gallbladder on ultrasound with stones. EXAM: NUCLEAR MEDICINE HEPATOBILIARY IMAGING TECHNIQUE: Sequential images of the abdomen were obtained out to 60 minutes following intravenous administration of radiopharmaceutical. RADIOPHARMACEUTICALS:  5.1 mCi Tc-67m  Choletec IV COMPARISON:  Ultrasound 06/17/2021 FINDINGS: Difficult exam with patient moving during exam. There is prompt clearance of radiotracer from blood pool and homogeneous uptake in the liver. Counts are evident within the common bile duct and small bowel by 15 minutes. The gallbladder fails to fill after 90 minutes of imaging. There are insufficient counts within the liver to administered morphine. No booster radiotracer dose is available. IMPRESSION: Equivocal exam. Non filling of the gallbladder over 90 minutes. Morphine was not able to be administered to augment filling of the gallbladder. Differential would include acute cholecystitis versus prolonged fasting state. Recommend correlation with last meal. Electronically Signed   By:  Suzy Bouchard M.D.   On: 06/17/2021 16:54   US Abdomen Limited RUQ (LIVER/GB)  Result Date: 06/17/2021 CLINICAL DATA:  Right upper quadrant abdominal pain and tenderness for the past 2 weeks. EXAM: ULTRASOUND ABDOMEN LIMITED RIGHT UPPER QUADRANT COMPARISON:  12/09/2016.  Abdomen and pelvis CT dated 06/16/2021. FINDINGS: Gallbladder: Again demonstrated are multiple gallstones in the gallbladder measuring up to 13 mm in maximum diameter each. Mild gallbladder wall thickening and edema, measuring 6.7 mm in maximum thickness. Minimal pericholecystic fluid. No sonographic Murphy sign. Common bile duct: Diameter: 3.2 mm Liver: Diffusely echogenic. No corresponding low density on the recent CT or other findings.  Portal vein is patent on color Doppler imaging with normal direction of blood flow towards the liver. Other: None. IMPRESSION: 1. Cholelithiasis and mild gallbladder wall thickening with minimal pericholecystic fluid, possibly represent early changes of acute cholecystitis. 2. Diffusely echogenic liver. This is most likely due to CT occult steatosis. Chronic hepatitis and cirrhosis can also produce this appearance. Electronically Signed   By: Claudie Revering M.D.   On: 06/17/2021 11:57      Subjective: No new complaints today.   Discharge Exam: Vitals:   06/20/21 0553 06/20/21 0804  BP: (!) 129/55 122/73  Pulse: 81 66  Resp: 18 16  Temp: 98.2 F (36.8 C) 98.7 F (37.1 C)  SpO2: 94% 96%   Vitals:   06/19/21 1530 06/19/21 2107 06/20/21 0553 06/20/21 0804  BP: 125/76 102/82 (!) 129/55 122/73  Pulse: 84 86 81 66  Resp: 16 19 18 16   Temp: 97.8 F (36.6 C) 97.6 F (36.4 C) 98.2 F (36.8 C) 98.7 F (37.1 C)  TempSrc:      SpO2: 95% 94% 94% 96%  Weight:      Height:        General: Pt is alert, awake, not in acute distress Cardiovascular: RRR, S1/S2 +, no rubs, no gallops Respiratory: CTA bilaterally, no wheezing, no rhonchi Abdominal: Soft, NT, ND, bowel sounds + Extremities: no edema, no cyanosis    The results of significant diagnostics from this hospitalization (including imaging, microbiology, ancillary and laboratory) are listed below for reference.     Microbiology: Recent Results (from the past 240 hour(s))  Urine Culture     Status: Abnormal   Collection Time: 06/16/21  6:07 PM   Specimen: Urine, Clean Catch  Result Value Ref Range Status   Specimen Description   Final    URINE, CLEAN CATCH Performed at [facility], [address], [place], [place] [address]    Special Requests   Final    NONE Performed at [facility], [address] [place], [place] [address]    Culture (A)  Final    <10,000 COLONIES/mL INSIGNIFICANT GROWTH Performed  at [place] [address], [place], [place] [address]    Report Status 06/18/2021 FINAL  Final  Resp Panel by RT-PCR (Flu A&B, Covid) Nasopharyngeal Swab     Status: None   Collection Time: 06/16/21  9:31 PM   Specimen: Nasopharyngeal Swab; Nasopharyngeal(NP) swabs in vial transport medium  Result Value Ref Range Status   SARS Coronavirus 2 by RT PCR NEGATIVE NEGATIVE Final    Comment: (NOTE) SARS-CoV-2 target nucleic acids are NOT DETECTED.  The SARS-CoV-2 RNA is generally detectable in upper respiratory specimens during the acute phase of infection. The lowest concentration of SARS-CoV-2 viral copies this assay can detect is 138 copies/mL. A negative result does not preclude SARS-Cov-2 infection and should  not be used as the sole basis for treatment or other patient management decisions. A negative result may occur with  improper specimen collection/handling, submission of specimen other than nasopharyngeal swab, presence of viral mutation(s) within the areas targeted by this assay, and inadequate number of viral copies(<138 copies/mL). A negative result must be combined with clinical observations, patient history, and epidemiological information. The expected result is Negative.  Fact Sheet for Patients:  EntrepreneurPulse.com.au  Fact Sheet for Healthcare Providers:  IncredibleEmployment.be  This test is no t yet approved or cleared by the [place] FDA and  has been authorized for detection and/or diagnosis of SARS-CoV-2 by FDA under an Emergency Use Authorization (EUA). This EUA will remain  in effect (meaning this test can be used) for the duration of the COVID-19 declaration under Section 564(b)(1) of the Act, 21 U.S.C.section 360bbb-3(b)(1), unless the authorization is terminated  or revoked sooner.       Influenza A by PCR NEGATIVE NEGATIVE Final   Influenza B by PCR NEGATIVE NEGATIVE Final    Comment: (NOTE) The  Xpert Xpress SARS-CoV-2/FLU/RSV plus assay is intended as an aid in the diagnosis of influenza from Nasopharyngeal swab specimens and should not be used as a sole basis for treatment. Nasal washings and aspirates are unacceptable for Xpert Xpress SARS-CoV-2/FLU/RSV testing.  Fact Sheet for Patients: EntrepreneurPulse.com.au  Fact Sheet for Healthcare Providers: IncredibleEmployment.be  This test is not yet approved or cleared by the [place] FDA and has been authorized for detection and/or diagnosis of SARS-CoV-2 by FDA under an Emergency Use Authorization (EUA). This EUA will remain in effect (meaning this test can be used) for the duration of the COVID-19 declaration under Section 564(b)(1) of the Act, 21 U.S.C. section 360bbb-3(b)(1), unless the authorization is terminated or revoked.  Performed at [facility], [address] [place], [place] [address]   Surgical pcr screen     Status: Abnormal   Collection Time: 06/18/21  8:28 AM   Specimen: Nasal Mucosa; Nasal Swab  Result Value Ref Range Status   MRSA, PCR NEGATIVE NEGATIVE Final   Staphylococcus aureus POSITIVE (A) NEGATIVE Final    Comment: (NOTE) The Xpert SA Assay (FDA approved for NASAL specimens in patients 69 years of age and older), is one component of a comprehensive surveillance program. It is not intended to diagnose infection nor to guide or monitor treatment. Performed at [facility], [address] [place], [place] [address]      Labs: BNP (last 3 results) No results for input(s): BNP in the last 8760 hours. Basic Metabolic Panel: Recent Labs  Lab 06/16/21 1759 06/17/21 0518 06/18/21 0504 06/19/21 0833  NA 133* 134* 136 134*  K 4.7 4.9 4.5 3.8  CL 99 102 103 103  CO2 24 23 26 24   GLUCOSE 185* 214* 141* 148*  BUN 51* 46* 30* 32*  CREATININE 1.69* 1.53* 1.61* 1.49*  CALCIUM 9.6 8.8* 8.3* 7.8*   Liver Function Tests: Recent Labs   Lab 06/16/21 1759 06/19/21 0833  AST 15 27  ALT 15 30  ALKPHOS 70 41  BILITOT 1.0 1.0  PROT 7.7 6.2*  ALBUMIN 4.5 2.8*   Recent Labs  Lab 06/16/21 1759  LIPASE 33   No results for input(s): AMMONIA in the last 168 hours. CBC: Recent Labs  Lab 06/16/21 1759 06/17/21 0518 06/18/21 0504 06/19/21 0833  WBC 7.9 8.6 8.8 6.4  NEUTROABS  --   --  7.6  --   HGB 13.1 11.9*  11.9* 11.0*  HCT 39.6 34.7* 36.0* 32.8*  MCV 84.8 83.8 83.7 83.2  PLT 122* 97* 82* 81*   Cardiac Enzymes: No results for input(s): CKTOTAL, CKMB, CKMBINDEX, TROPONINI in the last 168 hours. BNP: Invalid input(s): POCBNP CBG: Recent Labs  Lab 06/19/21 0825 06/19/21 1121 06/19/21 1639 06/19/21 2108 06/20/21 0805  GLUCAP 157* 168* 125* 154* 132*   D-Dimer No results for input(s): DDIMER in the last 72 hours. Hgb A1c No results for input(s): HGBA1C in the last 72 hours. Lipid Profile No results for input(s): CHOL, HDL, LDLCALC, TRIG, CHOLHDL, LDLDIRECT in the last 72 hours. Thyroid function studies No results for input(s): TSH, T4TOTAL, T3FREE, THYROIDAB in the last 72 hours.  Invalid input(s): FREET3 Anemia work up No results for input(s): VITAMINB12, FOLATE, FERRITIN, TIBC, IRON, RETICCTPCT in the last 72 hours. Urinalysis    Component Value Date/Time   COLORURINE YELLOW (A) 06/16/2021 1807   APPEARANCEUR HAZY (A) 06/16/2021 1807   LABSPEC 1.023 06/16/2021 1807   PHURINE 5.0 06/16/2021 1807   GLUCOSEU NEGATIVE 06/16/2021 1807   HGBUR NEGATIVE 06/16/2021 1807   BILIRUBINUR NEGATIVE 06/16/2021 1807   KETONESUR 5 (A) 06/16/2021 1807   PROTEINUR NEGATIVE 06/16/2021 1807   UROBILINOGEN 1.0 02/09/2015 1005   NITRITE NEGATIVE 06/16/2021 1807   LEUKOCYTESUR SMALL (A) 06/16/2021 1807   Sepsis Labs Invalid input(s): PROCALCITONIN,  WBC,  LACTICIDVEN Microbiology Recent Results (from the past 240 hour(s))  Urine Culture     Status: Abnormal   Collection Time: 06/16/21  6:07 PM   Specimen:  Urine, Clean Catch  Result Value Ref Range Status   Specimen Description   Final    URINE, CLEAN CATCH Performed at [facility], [address], [place], [place] [address]    Special Requests   Final    NONE Performed at [facility], [address], [place], [place] [address]    Culture (A)  Final    <10,000 COLONIES/mL INSIGNIFICANT GROWTH Performed at [facility], [place] [address], [place], [place] [address]    Report Status 06/18/2021 FINAL  Final  Resp Panel by RT-PCR (Flu A&B, Covid) Nasopharyngeal Swab     Status: None   Collection Time: 06/16/21  9:31 PM   Specimen: Nasopharyngeal Swab; Nasopharyngeal(NP) swabs in vial transport medium  Result Value Ref Range Status   SARS Coronavirus 2 by RT PCR NEGATIVE NEGATIVE Final    Comment: (NOTE) SARS-CoV-2 target nucleic acids are NOT DETECTED.  The SARS-CoV-2 RNA is generally detectable in upper respiratory specimens during the acute phase of infection. The lowest concentration of SARS-CoV-2 viral copies this assay can detect is 138 copies/mL. A negative result does not preclude SARS-Cov-2 infection and should not be used as the sole basis for treatment or other patient management decisions. A negative result may occur with  improper specimen collection/handling, submission of specimen other than nasopharyngeal swab, presence of viral mutation(s) within the areas targeted by this assay, and inadequate number of viral copies(<138 copies/mL). A negative result must be combined with clinical observations, patient history, and epidemiological information. The expected result is Negative.  Fact Sheet for Patients:  EntrepreneurPulse.com.au  Fact Sheet for Healthcare Providers:  IncredibleEmployment.be  This test is no t yet approved or cleared by the [place] FDA and  has been authorized for detection and/or diagnosis of SARS-CoV-2 by FDA under an Emergency Use  Authorization (EUA). This EUA will remain  in effect (meaning this test can be used) for the duration of  the COVID-19 declaration under Section 564(b)(1) of the Act, 21 U.S.C.section 360bbb-3(b)(1), unless the authorization is terminated  or revoked sooner.       Influenza A by PCR NEGATIVE NEGATIVE Final   Influenza B by PCR NEGATIVE NEGATIVE Final    Comment: (NOTE) The Xpert Xpress SARS-CoV-2/FLU/RSV plus assay is intended as an aid in the diagnosis of influenza from Nasopharyngeal swab specimens and should not be used as a sole basis for treatment. Nasal washings and aspirates are unacceptable for Xpert Xpress SARS-CoV-2/FLU/RSV testing.  Fact Sheet for Patients: EntrepreneurPulse.com.au  Fact Sheet for Healthcare Providers: IncredibleEmployment.be  This test is not yet approved or cleared by the [place] FDA and has been authorized for detection and/or diagnosis of SARS-CoV-2 by FDA under an Emergency Use Authorization (EUA). This EUA will remain in effect (meaning this test can be used) for the duration of the COVID-19 declaration under Section 564(b)(1) of the Act, 21 U.S.C. section 360bbb-3(b)(1), unless the authorization is terminated or revoked.  Performed at [facility], [address] [place], [place] [address]   Surgical pcr screen     Status: Abnormal   Collection Time: 06/18/21  8:28 AM   Specimen: Nasal Mucosa; Nasal Swab  Result Value Ref Range Status   MRSA, PCR NEGATIVE NEGATIVE Final   Staphylococcus aureus POSITIVE (A) NEGATIVE Final    Comment: (NOTE) The Xpert SA Assay (FDA approved for NASAL specimens in patients 67 years of age and older), is one component of a comprehensive surveillance program. It is not intended to diagnose infection nor to guide or monitor treatment. Performed at [facility], [address] [place], [place] [address]      Time coordinating discharge: 35  minutes.   SIGNED:   Hosie Poisson, MD  [facility] 06/20/2021, 10:57 AM

## 2021-06-20 NOTE — Plan of Care (Signed)

## 2021-06-20 NOTE — Care Management Important Message (Signed)
Important Message  Patient Details  Name: Daniel Sherman MRN: [id number] Date of Birth: [birth year]   Medicare Important Message Given:  N/A - LOS <3 / Initial given by admissions     [name] 06/20/2021, 10:16 AM

## 2021-06-21 DIAGNOSIS — E785 Hyperlipidemia, unspecified: Secondary | ICD-10-CM | POA: Diagnosis not present

## 2021-06-21 DIAGNOSIS — D509 Iron deficiency anemia, unspecified: Secondary | ICD-10-CM | POA: Diagnosis not present

## 2021-06-21 DIAGNOSIS — Z48815 Encounter for surgical aftercare following surgery on the digestive system: Secondary | ICD-10-CM | POA: Diagnosis not present

## 2021-06-21 DIAGNOSIS — I2510 Atherosclerotic heart disease of native coronary artery without angina pectoris: Secondary | ICD-10-CM | POA: Diagnosis not present

## 2021-06-21 DIAGNOSIS — D6949 Other primary thrombocytopenia: Secondary | ICD-10-CM | POA: Diagnosis not present

## 2021-06-21 DIAGNOSIS — I10 Essential (primary) hypertension: Secondary | ICD-10-CM | POA: Diagnosis not present

## 2021-06-21 DIAGNOSIS — K219 Gastro-esophageal reflux disease without esophagitis: Secondary | ICD-10-CM | POA: Diagnosis not present

## 2021-06-21 DIAGNOSIS — E11319 Type 2 diabetes mellitus with unspecified diabetic retinopathy without macular edema: Secondary | ICD-10-CM | POA: Diagnosis not present

## 2021-06-21 DIAGNOSIS — F0390 Unspecified dementia without behavioral disturbance: Secondary | ICD-10-CM | POA: Diagnosis not present

## 2021-06-22 DIAGNOSIS — E785 Hyperlipidemia, unspecified: Secondary | ICD-10-CM | POA: Diagnosis not present

## 2021-06-22 DIAGNOSIS — F0390 Unspecified dementia without behavioral disturbance: Secondary | ICD-10-CM | POA: Diagnosis not present

## 2021-06-22 DIAGNOSIS — D6949 Other primary thrombocytopenia: Secondary | ICD-10-CM | POA: Diagnosis not present

## 2021-06-22 DIAGNOSIS — I2510 Atherosclerotic heart disease of native coronary artery without angina pectoris: Secondary | ICD-10-CM | POA: Diagnosis not present

## 2021-06-22 DIAGNOSIS — K219 Gastro-esophageal reflux disease without esophagitis: Secondary | ICD-10-CM | POA: Diagnosis not present

## 2021-06-22 DIAGNOSIS — I10 Essential (primary) hypertension: Secondary | ICD-10-CM | POA: Diagnosis not present

## 2021-06-22 DIAGNOSIS — E11319 Type 2 diabetes mellitus with unspecified diabetic retinopathy without macular edema: Secondary | ICD-10-CM | POA: Diagnosis not present

## 2021-06-22 DIAGNOSIS — D509 Iron deficiency anemia, unspecified: Secondary | ICD-10-CM | POA: Diagnosis not present

## 2021-06-22 DIAGNOSIS — Z48815 Encounter for surgical aftercare following surgery on the digestive system: Secondary | ICD-10-CM | POA: Diagnosis not present

## 2021-06-23 ENCOUNTER — Other Ambulatory Visit: Payer: Self-pay

## 2021-06-23 ENCOUNTER — Encounter: Payer: Self-pay | Admitting: Surgery

## 2021-06-23 ENCOUNTER — Ambulatory Visit (INDEPENDENT_AMBULATORY_CARE_PROVIDER_SITE_OTHER): Payer: Medicare HMO | Admitting: Surgery

## 2021-06-23 VITALS — BP 124/69 | HR 74 | Temp 98.3°F | Wt 184.4 lb

## 2021-06-23 DIAGNOSIS — K8000 Calculus of gallbladder with acute cholecystitis without obstruction: Secondary | ICD-10-CM

## 2021-06-23 DIAGNOSIS — Z09 Encounter for follow-up examination after completed treatment for conditions other than malignant neoplasm: Secondary | ICD-10-CM

## 2021-06-23 NOTE — Progress Notes (Signed)
06/23/2021  HPI: Daniel Sherman is a 80 y.o. male s/p robotic assisted cholecystectomy on 06/18/2021.  Patient presents today for follow-up.  JP drain was left in place due to significant inflammation in the right upper quadrant.  Patient reports that he has been doing well and he denies any worsening pain.  Reports some low appetite and had 1 episode of diarrhea when eating fried okra.  Vital signs: BP 124/69   Pulse 74   Temp 98.3 F (36.8 C)   Wt 184 lb 6.4 oz (83.6 kg)   SpO2 98%   BMI 30.69 kg/m    Physical Exam: Constitutional: No acute distress Abdomen: Soft, nondistended, appropriately sore to palpation of the incisions, which are clean, dry, intact with Dermabond in place.  There is mild surrounding ecchymosis.  Blake drain with serous fluid.  This was removed at bedside without complications.  Assessment/Plan: This is a 80 y.o. male s/p robotic assisted cholecystectomy.  - Drain was removed today with no complications.  Dry gauze dressing applied instructed with dressing changes once daily until wound is healed. - Follow-up in 2 weeks to assess his progress.   Melvyn Neth, [facility]

## 2021-06-23 NOTE — Patient Instructions (Addendum)
If you have any concerns or questions, please feel free to call our office. See follow up appointment below.   GENERAL POST-OPERATIVE PATIENT INSTRUCTIONS   WOUND CARE INSTRUCTIONS:  Keep a dry clean dressing on the wound if there is drainage. The initial bandage may be removed after 24 hours.  Once the wound has quit draining you may leave it open to air.  If clothing rubs against the wound or causes irritation and the wound is not draining you may cover it with a dry dressing during the daytime.  Try to keep the wound dry and avoid ointments on the wound unless directed to do so.  If the wound becomes bright red and painful or starts to drain infected material that is not clear, please contact your physician immediately.  If the wound is mildly pink and has a thick firm ridge underneath it, this is normal, and is referred to as a healing ridge.  This will resolve over the next 4-6 weeks.  BATHING: You may shower if you have been informed of this by your surgeon. However, Please do not submerge in a tub, hot tub, or pool until incisions are completely sealed or have been told by your surgeon that you may do so.  DIET:  You may eat any foods that you can tolerate.  It is a good idea to eat a high fiber diet and take in plenty of fluids to prevent constipation.  If you do become constipated you may want to take a mild laxative or take ducolax tablets on a daily basis until your bowel habits are regular.  Constipation can be very uncomfortable, along with straining, after recent surgery.  ACTIVITY:  You are encouraged to cough and deep breath or use your incentive spirometer if you were given one, every 15-30 minutes when awake.  This will help prevent respiratory complications and low grade fevers post-operatively if you had a general anesthetic.  You may want to hug a pillow when coughing and sneezing to add additional support to the surgical area, if you had abdominal or chest surgery, which will  decrease pain during these times.  You are encouraged to walk and engage in light activity for the next two weeks.  You should not lift more than 15 pounds for 6 weeks after surgery (07/30/2021 as it could put you at increased risk for complications.  Twenty pounds is roughly equivalent to a plastic bag of groceries. At that time- Listen to your body when lifting, if you have pain when lifting, stop and then try again in a few days. Soreness after doing exercises or activities of daily living is normal as you get back in to your normal routine.  MEDICATIONS:  Try to take narcotic medications and anti-inflammatory medications, such as tylenol, ibuprofen, naprosyn, etc., with food.  This will minimize stomach upset from the medication.  Should you develop nausea and vomiting from the pain medication, or develop a rash, please discontinue the medication and contact your physician.  You should not drive, make important decisions, or operate machinery when taking narcotic pain medication.  SUNBLOCK Use sun block to incision area over the next year if this area will be exposed to sun. This helps decrease scarring and will allow you avoid a permanent darkened area over your incision.   Gallbladder Eating Plan  If you have a gallbladder condition, you may have trouble digesting fats. Eating a low-fat diet can help reduce your symptoms, and may be helpful before and after  having surgery to remove your gallbladder (cholecystectomy). Your health care provider may recommend that you work with a diet and nutrition specialist (dietitian) to help you reduce the amount of fat in your diet. What are tips for following this plan? General guidelines Limit your fat intake to less than 30% of your total daily calories. If you eat around 1,800 calories each day, this is less than 60 grams (g) of fat per day. Fat is an important part of a healthy diet. Eating a low-fat diet can make it hard to maintain a healthy body weight.  Ask your dietitian how much fat, calories, and other nutrients you need each day. Eat small, frequent meals throughout the day instead of three large meals. Drink at least 8-10 cups of fluid a day. Drink enough fluid to keep your urine clear or pale yellow. Limit alcohol intake to no more than 1 drink a day for nonpregnant women and 2 drinks a day for men. One drink equals 12 oz of beer, 5 oz of wine, or 1 oz of hard liquor. Reading food labels  Check Nutrition Facts on food labels for the amount of fat per serving. Choose foods with less than 3 grams of fat per serving.  Shopping Choose nonfat and low-fat healthy foods. Look for the words "nonfat," "low fat," or "fat free." Avoid buying processed or prepackaged foods. Cooking Cook using low-fat methods, such as baking, broiling, grilling, or boiling. Cook with small amounts of healthy fats, such as olive oil, grapeseed oil, canola oil, or sunflower oil. What foods are recommended? All fresh, frozen, or canned fruits and vegetables. Whole grains. Low-fat or non-fat (skim) milk and yogurt. Lean meat, skinless poultry, fish, eggs, and beans. Low-fat protein supplement powders or drinks. Spices and herbs. What foods are not recommended? High-fat foods. These include baked goods, fast food, fatty cuts of meat, ice cream, french toast, sweet rolls, pizza, cheese bread, foods covered with butter, creamy sauces, or cheese. Fried foods. These include french fries, tempura, battered fish, breaded chicken, fried breads, and sweets. Foods with strong odors. Foods that cause bloating and gas. Summary A low-fat diet can be helpful if you have a gallbladder condition, or before and after gallbladder surgery. Limit your fat intake to less than 30% of your total daily calories. This is about 60 g of fat if you eat 1,800 calories each day. Eat small, frequent meals throughout the day instead of three large meals. This information is not intended to  replace advice given to you by your health care provider. Make sure you discuss any questions you have with your healthcare provider. Document Revised: 07/07/2020 Document Reviewed: 07/07/2020 Elsevier Patient Education  2022 [employer].

## 2021-06-26 DIAGNOSIS — F0150 Vascular dementia without behavioral disturbance: Secondary | ICD-10-CM | POA: Diagnosis not present

## 2021-06-26 DIAGNOSIS — Z09 Encounter for follow-up examination after completed treatment for conditions other than malignant neoplasm: Secondary | ICD-10-CM | POA: Diagnosis not present

## 2021-06-26 DIAGNOSIS — G309 Alzheimer's disease, unspecified: Secondary | ICD-10-CM | POA: Diagnosis not present

## 2021-06-26 DIAGNOSIS — Z9049 Acquired absence of other specified parts of digestive tract: Secondary | ICD-10-CM | POA: Diagnosis not present

## 2021-06-26 DIAGNOSIS — R634 Abnormal weight loss: Secondary | ICD-10-CM | POA: Diagnosis not present

## 2021-06-26 DIAGNOSIS — K8689 Other specified diseases of pancreas: Secondary | ICD-10-CM | POA: Diagnosis not present

## 2021-06-26 DIAGNOSIS — F0280 Dementia in other diseases classified elsewhere without behavioral disturbance: Secondary | ICD-10-CM | POA: Diagnosis not present

## 2021-06-27 DIAGNOSIS — D509 Iron deficiency anemia, unspecified: Secondary | ICD-10-CM | POA: Diagnosis not present

## 2021-06-27 DIAGNOSIS — F0390 Unspecified dementia without behavioral disturbance: Secondary | ICD-10-CM | POA: Diagnosis not present

## 2021-06-27 DIAGNOSIS — D6949 Other primary thrombocytopenia: Secondary | ICD-10-CM | POA: Diagnosis not present

## 2021-06-27 DIAGNOSIS — I2510 Atherosclerotic heart disease of native coronary artery without angina pectoris: Secondary | ICD-10-CM | POA: Diagnosis not present

## 2021-06-27 DIAGNOSIS — K219 Gastro-esophageal reflux disease without esophagitis: Secondary | ICD-10-CM | POA: Diagnosis not present

## 2021-06-27 DIAGNOSIS — Z48815 Encounter for surgical aftercare following surgery on the digestive system: Secondary | ICD-10-CM | POA: Diagnosis not present

## 2021-06-27 DIAGNOSIS — E785 Hyperlipidemia, unspecified: Secondary | ICD-10-CM | POA: Diagnosis not present

## 2021-06-27 DIAGNOSIS — I10 Essential (primary) hypertension: Secondary | ICD-10-CM | POA: Diagnosis not present

## 2021-06-27 DIAGNOSIS — E11319 Type 2 diabetes mellitus with unspecified diabetic retinopathy without macular edema: Secondary | ICD-10-CM | POA: Diagnosis not present

## 2021-06-27 NOTE — Anesthesia Postprocedure Evaluation (Signed)
Anesthesia Post Note  Patient: Daniel Sherman  Procedure(s) Performed: XI ROBOTIC ASSISTED LAPAROSCOPIC CHOLECYSTECTOMY (Abdomen)  Patient location during evaluation: PACU Anesthesia Type: General Level of consciousness: awake and alert Pain management: pain level controlled Vital Signs Assessment: post-procedure vital signs reviewed and stable Respiratory status: spontaneous breathing, nonlabored ventilation, respiratory function stable and patient connected to nasal cannula oxygen Cardiovascular status: blood pressure returned to baseline and stable Postop Assessment: no apparent nausea or vomiting Anesthetic complications: no   No notable events documented.   Last Vitals:  Vitals:   06/20/21 0553 06/20/21 0804  BP: (!) 129/55 122/73  Pulse: 81 66  Resp: 18 16  Temp: 36.8 C 37.1 C  SpO2: 94% 96%    Last Pain:  Vitals:   06/20/21 0800  TempSrc:   PainSc: 0-No pain                 Molli Barrows

## 2021-06-28 DIAGNOSIS — Z48815 Encounter for surgical aftercare following surgery on the digestive system: Secondary | ICD-10-CM | POA: Diagnosis not present

## 2021-06-28 DIAGNOSIS — F0390 Unspecified dementia without behavioral disturbance: Secondary | ICD-10-CM | POA: Diagnosis not present

## 2021-06-28 DIAGNOSIS — D509 Iron deficiency anemia, unspecified: Secondary | ICD-10-CM | POA: Diagnosis not present

## 2021-06-28 DIAGNOSIS — D6949 Other primary thrombocytopenia: Secondary | ICD-10-CM | POA: Diagnosis not present

## 2021-06-28 DIAGNOSIS — E785 Hyperlipidemia, unspecified: Secondary | ICD-10-CM | POA: Diagnosis not present

## 2021-06-28 DIAGNOSIS — I2510 Atherosclerotic heart disease of native coronary artery without angina pectoris: Secondary | ICD-10-CM | POA: Diagnosis not present

## 2021-06-28 DIAGNOSIS — E11319 Type 2 diabetes mellitus with unspecified diabetic retinopathy without macular edema: Secondary | ICD-10-CM | POA: Diagnosis not present

## 2021-06-28 DIAGNOSIS — I10 Essential (primary) hypertension: Secondary | ICD-10-CM | POA: Diagnosis not present

## 2021-06-28 DIAGNOSIS — K219 Gastro-esophageal reflux disease without esophagitis: Secondary | ICD-10-CM | POA: Diagnosis not present

## 2021-06-29 DIAGNOSIS — I10 Essential (primary) hypertension: Secondary | ICD-10-CM | POA: Diagnosis not present

## 2021-06-29 DIAGNOSIS — I2510 Atherosclerotic heart disease of native coronary artery without angina pectoris: Secondary | ICD-10-CM | POA: Diagnosis not present

## 2021-06-29 DIAGNOSIS — D509 Iron deficiency anemia, unspecified: Secondary | ICD-10-CM | POA: Diagnosis not present

## 2021-06-29 DIAGNOSIS — D6949 Other primary thrombocytopenia: Secondary | ICD-10-CM | POA: Diagnosis not present

## 2021-06-29 DIAGNOSIS — E11319 Type 2 diabetes mellitus with unspecified diabetic retinopathy without macular edema: Secondary | ICD-10-CM | POA: Diagnosis not present

## 2021-06-29 DIAGNOSIS — F0390 Unspecified dementia without behavioral disturbance: Secondary | ICD-10-CM | POA: Diagnosis not present

## 2021-06-29 DIAGNOSIS — Z48815 Encounter for surgical aftercare following surgery on the digestive system: Secondary | ICD-10-CM | POA: Diagnosis not present

## 2021-07-04 DIAGNOSIS — I2510 Atherosclerotic heart disease of native coronary artery without angina pectoris: Secondary | ICD-10-CM | POA: Diagnosis not present

## 2021-07-04 DIAGNOSIS — K219 Gastro-esophageal reflux disease without esophagitis: Secondary | ICD-10-CM | POA: Diagnosis not present

## 2021-07-04 DIAGNOSIS — E785 Hyperlipidemia, unspecified: Secondary | ICD-10-CM | POA: Diagnosis not present

## 2021-07-04 DIAGNOSIS — D509 Iron deficiency anemia, unspecified: Secondary | ICD-10-CM | POA: Diagnosis not present

## 2021-07-04 DIAGNOSIS — E11319 Type 2 diabetes mellitus with unspecified diabetic retinopathy without macular edema: Secondary | ICD-10-CM | POA: Diagnosis not present

## 2021-07-04 DIAGNOSIS — D6949 Other primary thrombocytopenia: Secondary | ICD-10-CM | POA: Diagnosis not present

## 2021-07-04 DIAGNOSIS — I10 Essential (primary) hypertension: Secondary | ICD-10-CM | POA: Diagnosis not present

## 2021-07-04 DIAGNOSIS — Z48815 Encounter for surgical aftercare following surgery on the digestive system: Secondary | ICD-10-CM | POA: Diagnosis not present

## 2021-07-04 DIAGNOSIS — F0390 Unspecified dementia without behavioral disturbance: Secondary | ICD-10-CM | POA: Diagnosis not present

## 2021-07-07 ENCOUNTER — Encounter: Payer: Self-pay | Admitting: Physician Assistant

## 2021-07-07 ENCOUNTER — Other Ambulatory Visit: Payer: Self-pay

## 2021-07-07 ENCOUNTER — Ambulatory Visit (INDEPENDENT_AMBULATORY_CARE_PROVIDER_SITE_OTHER): Payer: Medicare HMO | Admitting: Physician Assistant

## 2021-07-07 VITALS — BP 120/60 | HR 76 | Temp 98.1°F | Ht 66.0 in | Wt 177.4 lb

## 2021-07-07 DIAGNOSIS — K8000 Calculus of gallbladder with acute cholecystitis without obstruction: Secondary | ICD-10-CM

## 2021-07-07 DIAGNOSIS — Z09 Encounter for follow-up examination after completed treatment for conditions other than malignant neoplasm: Secondary | ICD-10-CM

## 2021-07-07 NOTE — Progress Notes (Signed)
[facility] POST-OP OFFICE VISIT  07/07/2021  HPI: Daniel Sherman is a 80 y.o. male 19 days s/p robotic assisted laparoscopic cholecystectomy with Dr Hampton Abbot. He was last seen on 07/22 for drain removal.  Since then, his son reports that he feels that his dementia is "about 20%" worse than compared to pre-operatively. He feels that his memory issues have suffered, and some days he does not remember his family members. This is rightfully very frustrating for them.  Additionally, he has had a decrease in his appetite since surgery. They are trying to supplement protein drinks, but he will only tolerate a few sips.  Otherwise, no abdominal pain, nausea, emesis, fever, chills, or diarrhea Incisions have healed well No other complaints    Vital signs: BP 120/60   Pulse 76   Temp 98.1 F (36.7 C) (Oral)   Ht '5\' 6"'$  (1.676 m)   Wt 177 lb 6.4 oz (80.5 kg)   SpO2 98%   BMI 28.63 kg/m    Physical Exam: Constitutional: Well appearing male, NAD Abdomen: soft, non-tender, non-distended, no rebound/guarding Neuro: He is oriented to his birthday and person, but disoriented to time, place, and situation Skin: Laparoscopic incisions are well healed, no erythema or drainage   Assessment/Plan: This is a 80 y.o. male 19 days s/p robotic assisted laparoscopic cholecystectomy   - Encouraged son to discuss increase in dementia symptoms with his memory care neurologist  - Encouraged continued supplement nutrition with protein drinks/shakes  - Pain control prn  - Reviewed wound care  - Reviewed lifting restrictions   - He can follow up with general surgery clinic on as needed basis   -- Edison Simon, PA-C [facility] 07/07/2021, 11:31 AM [phone] M-F: 7am - 4pm

## 2021-07-07 NOTE — Patient Instructions (Addendum)
If you have any concerns or questions, please feel free to call our office.    GENERAL POST-OPERATIVE PATIENT INSTRUCTIONS   WOUND CARE INSTRUCTIONS:  Keep a dry clean dressing on the wound if there is drainage. The initial bandage may be removed after 24 hours.  Once the wound has quit draining you may leave it open to air.  If clothing rubs against the wound or causes irritation and the wound is not draining you may cover it with a dry dressing during the daytime.  Try to keep the wound dry and avoid ointments on the wound unless directed to do so.  If the wound becomes bright red and painful or starts to drain infected material that is not clear, please contact your physician immediately.  If the wound is mildly pink and has a thick firm ridge underneath it, this is normal, and is referred to as a healing ridge.  This will resolve over the next 4-6 weeks.  BATHING: You may shower if you have been informed of this by your surgeon. However, Please do not submerge in a tub, hot tub, or pool until incisions are completely sealed or have been told by your surgeon that you may do so.  DIET:  You may eat any foods that you can tolerate.  It is a good idea to eat a high fiber diet and take in plenty of fluids to prevent constipation.  If you do become constipated you may want to take a mild laxative or take ducolax tablets on a daily basis until your bowel habits are regular.  Constipation can be very uncomfortable, along with straining, after recent surgery.  ACTIVITY:  You are encouraged to cough and deep breath or use your incentive spirometer if you were given one, every 15-30 minutes when awake.  This will help prevent respiratory complications and low grade fevers post-operatively if you had a general anesthetic.  You may want to hug a pillow when coughing and sneezing to add additional support to the surgical area, if you had abdominal or chest surgery, which will decrease pain during these times.   You are encouraged to walk and engage in light activity for the next two weeks.  You should not lift more than 20 pounds, until 07/16/2021 as it could put you at increased risk for complications.  Twenty pounds is roughly equivalent to a plastic bag of groceries. At that time- Listen to your body when lifting, if you have pain when lifting, stop and then try again in a few days. Soreness after doing exercises or activities of daily living is normal as you get back in to your normal routine.  MEDICATIONS:  Try to take narcotic medications and anti-inflammatory medications, such as tylenol, ibuprofen, naprosyn, etc., with food.  This will minimize stomach upset from the medication.  Should you develop nausea and vomiting from the pain medication, or develop a rash, please discontinue the medication and contact your physician.  You should not drive, make important decisions, or operate machinery when taking narcotic pain medication.  SUNBLOCK Use sun block to incision area over the next year if this area will be exposed to sun. This helps decrease scarring and will allow you avoid a permanent darkened area over your incision.  Gallbladder Eating Plan  If you have a gallbladder condition, you may have trouble digesting fats. Eating a low-fat diet can help reduce your symptoms, and may be helpful before and after having surgery to remove your gallbladder (cholecystectomy). Your health  care provider may recommend that you work with a diet and nutrition specialist (dietitian) to help you reduce the amount of fat in your diet. What are tips for following this plan? General guidelines Limit your fat intake to less than 30% of your total daily calories. If you eat around 1,800 calories each day, this is less than 60 grams (g) of fat per day. Fat is an important part of a healthy diet. Eating a low-fat diet can make it hard to maintain a healthy body weight. Ask your dietitian how much fat, calories, and other  nutrients you need each day. Eat small, frequent meals throughout the day instead of three large meals. Drink at least 8-10 cups of fluid a day. Drink enough fluid to keep your urine clear or pale yellow. Limit alcohol intake to no more than 1 drink a day for nonpregnant women and 2 drinks a day for men. One drink equals 12 oz of beer, 5 oz of wine, or 1 oz of hard liquor. Reading food labels  Check Nutrition Facts on food labels for the amount of fat per serving. Choose foods with less than 3 grams of fat per serving.  Shopping Choose nonfat and low-fat healthy foods. Look for the words "nonfat," "low fat," or "fat free." Avoid buying processed or prepackaged foods. Cooking Cook using low-fat methods, such as baking, broiling, grilling, or boiling. Cook with small amounts of healthy fats, such as olive oil, grapeseed oil, canola oil, or sunflower oil. What foods are recommended? All fresh, frozen, or canned fruits and vegetables. Whole grains. Low-fat or non-fat (skim) milk and yogurt. Lean meat, skinless poultry, fish, eggs, and beans. Low-fat protein supplement powders or drinks. Spices and herbs. What foods are not recommended? High-fat foods. These include baked goods, fast food, fatty cuts of meat, ice cream, french toast, sweet rolls, pizza, cheese bread, foods covered with butter, creamy sauces, or cheese. Fried foods. These include french fries, tempura, battered fish, breaded chicken, fried breads, and sweets. Foods with strong odors. Foods that cause bloating and gas. Summary A low-fat diet can be helpful if you have a gallbladder condition, or before and after gallbladder surgery. Limit your fat intake to less than 30% of your total daily calories. This is about 60 g of fat if you eat 1,800 calories each day. Eat small, frequent meals throughout the day instead of three large meals. This information is not intended to replace advice given to you by your health care  provider. Make sure you discuss any questions you have with your healthcare provider. Document Revised: 07/07/2020 Document Reviewed: 07/07/2020 Elsevier Patient Education  2022 [employer].

## 2021-07-10 DIAGNOSIS — F0390 Unspecified dementia without behavioral disturbance: Secondary | ICD-10-CM | POA: Diagnosis not present

## 2021-07-10 DIAGNOSIS — E785 Hyperlipidemia, unspecified: Secondary | ICD-10-CM | POA: Diagnosis not present

## 2021-07-10 DIAGNOSIS — I10 Essential (primary) hypertension: Secondary | ICD-10-CM | POA: Diagnosis not present

## 2021-07-10 DIAGNOSIS — I2510 Atherosclerotic heart disease of native coronary artery without angina pectoris: Secondary | ICD-10-CM | POA: Diagnosis not present

## 2021-07-10 DIAGNOSIS — D6949 Other primary thrombocytopenia: Secondary | ICD-10-CM | POA: Diagnosis not present

## 2021-07-10 DIAGNOSIS — Z48815 Encounter for surgical aftercare following surgery on the digestive system: Secondary | ICD-10-CM | POA: Diagnosis not present

## 2021-07-10 DIAGNOSIS — D509 Iron deficiency anemia, unspecified: Secondary | ICD-10-CM | POA: Diagnosis not present

## 2021-07-10 DIAGNOSIS — K219 Gastro-esophageal reflux disease without esophagitis: Secondary | ICD-10-CM | POA: Diagnosis not present

## 2021-07-10 DIAGNOSIS — E11319 Type 2 diabetes mellitus with unspecified diabetic retinopathy without macular edema: Secondary | ICD-10-CM | POA: Diagnosis not present

## 2021-07-11 DIAGNOSIS — I10 Essential (primary) hypertension: Secondary | ICD-10-CM | POA: Diagnosis not present

## 2021-07-11 DIAGNOSIS — D6949 Other primary thrombocytopenia: Secondary | ICD-10-CM | POA: Diagnosis not present

## 2021-07-11 DIAGNOSIS — F0390 Unspecified dementia without behavioral disturbance: Secondary | ICD-10-CM | POA: Diagnosis not present

## 2021-07-11 DIAGNOSIS — I2510 Atherosclerotic heart disease of native coronary artery without angina pectoris: Secondary | ICD-10-CM | POA: Diagnosis not present

## 2021-07-11 DIAGNOSIS — Z48815 Encounter for surgical aftercare following surgery on the digestive system: Secondary | ICD-10-CM | POA: Diagnosis not present

## 2021-07-11 DIAGNOSIS — E11319 Type 2 diabetes mellitus with unspecified diabetic retinopathy without macular edema: Secondary | ICD-10-CM | POA: Diagnosis not present

## 2021-07-11 DIAGNOSIS — E785 Hyperlipidemia, unspecified: Secondary | ICD-10-CM | POA: Diagnosis not present

## 2021-07-11 DIAGNOSIS — K219 Gastro-esophageal reflux disease without esophagitis: Secondary | ICD-10-CM | POA: Diagnosis not present

## 2021-07-11 DIAGNOSIS — D509 Iron deficiency anemia, unspecified: Secondary | ICD-10-CM | POA: Diagnosis not present

## 2021-07-17 DIAGNOSIS — F0390 Unspecified dementia without behavioral disturbance: Secondary | ICD-10-CM | POA: Diagnosis not present

## 2021-07-17 DIAGNOSIS — E11319 Type 2 diabetes mellitus with unspecified diabetic retinopathy without macular edema: Secondary | ICD-10-CM | POA: Diagnosis not present

## 2021-07-17 DIAGNOSIS — K219 Gastro-esophageal reflux disease without esophagitis: Secondary | ICD-10-CM | POA: Diagnosis not present

## 2021-07-17 DIAGNOSIS — E785 Hyperlipidemia, unspecified: Secondary | ICD-10-CM | POA: Diagnosis not present

## 2021-07-17 DIAGNOSIS — I10 Essential (primary) hypertension: Secondary | ICD-10-CM | POA: Diagnosis not present

## 2021-07-17 DIAGNOSIS — Z48815 Encounter for surgical aftercare following surgery on the digestive system: Secondary | ICD-10-CM | POA: Diagnosis not present

## 2021-07-17 DIAGNOSIS — D6949 Other primary thrombocytopenia: Secondary | ICD-10-CM | POA: Diagnosis not present

## 2021-07-17 DIAGNOSIS — I2510 Atherosclerotic heart disease of native coronary artery without angina pectoris: Secondary | ICD-10-CM | POA: Diagnosis not present

## 2021-07-17 DIAGNOSIS — D509 Iron deficiency anemia, unspecified: Secondary | ICD-10-CM | POA: Diagnosis not present

## 2021-07-19 DIAGNOSIS — Z48815 Encounter for surgical aftercare following surgery on the digestive system: Secondary | ICD-10-CM | POA: Diagnosis not present

## 2021-07-19 DIAGNOSIS — E785 Hyperlipidemia, unspecified: Secondary | ICD-10-CM | POA: Diagnosis not present

## 2021-07-19 DIAGNOSIS — E11319 Type 2 diabetes mellitus with unspecified diabetic retinopathy without macular edema: Secondary | ICD-10-CM | POA: Diagnosis not present

## 2021-07-19 DIAGNOSIS — K219 Gastro-esophageal reflux disease without esophagitis: Secondary | ICD-10-CM | POA: Diagnosis not present

## 2021-07-19 DIAGNOSIS — I10 Essential (primary) hypertension: Secondary | ICD-10-CM | POA: Diagnosis not present

## 2021-07-19 DIAGNOSIS — F0390 Unspecified dementia without behavioral disturbance: Secondary | ICD-10-CM | POA: Diagnosis not present

## 2021-07-19 DIAGNOSIS — D6949 Other primary thrombocytopenia: Secondary | ICD-10-CM | POA: Diagnosis not present

## 2021-07-19 DIAGNOSIS — D509 Iron deficiency anemia, unspecified: Secondary | ICD-10-CM | POA: Diagnosis not present

## 2021-07-19 DIAGNOSIS — I2510 Atherosclerotic heart disease of native coronary artery without angina pectoris: Secondary | ICD-10-CM | POA: Diagnosis not present

## 2021-07-28 DIAGNOSIS — E1169 Type 2 diabetes mellitus with other specified complication: Secondary | ICD-10-CM | POA: Diagnosis not present

## 2021-07-28 DIAGNOSIS — E1159 Type 2 diabetes mellitus with other circulatory complications: Secondary | ICD-10-CM | POA: Diagnosis not present

## 2021-07-28 DIAGNOSIS — E785 Hyperlipidemia, unspecified: Secondary | ICD-10-CM | POA: Diagnosis not present

## 2021-07-28 DIAGNOSIS — E119 Type 2 diabetes mellitus without complications: Secondary | ICD-10-CM | POA: Diagnosis not present

## 2021-07-28 DIAGNOSIS — Z794 Long term (current) use of insulin: Secondary | ICD-10-CM | POA: Diagnosis not present

## 2021-07-28 DIAGNOSIS — I152 Hypertension secondary to endocrine disorders: Secondary | ICD-10-CM | POA: Diagnosis not present

## 2021-09-04 ENCOUNTER — Other Ambulatory Visit: Payer: Self-pay

## 2021-09-04 ENCOUNTER — Inpatient Hospital Stay: Payer: Medicare HMO | Attending: Oncology

## 2021-09-04 DIAGNOSIS — Z85038 Personal history of other malignant neoplasm of large intestine: Secondary | ICD-10-CM | POA: Insufficient documentation

## 2021-09-04 DIAGNOSIS — E785 Hyperlipidemia, unspecified: Secondary | ICD-10-CM | POA: Diagnosis not present

## 2021-09-04 DIAGNOSIS — D509 Iron deficiency anemia, unspecified: Secondary | ICD-10-CM | POA: Diagnosis not present

## 2021-09-04 DIAGNOSIS — I10 Essential (primary) hypertension: Secondary | ICD-10-CM | POA: Insufficient documentation

## 2021-09-04 DIAGNOSIS — Z7984 Long term (current) use of oral hypoglycemic drugs: Secondary | ICD-10-CM | POA: Diagnosis not present

## 2021-09-04 DIAGNOSIS — R197 Diarrhea, unspecified: Secondary | ICD-10-CM | POA: Diagnosis not present

## 2021-09-04 DIAGNOSIS — D696 Thrombocytopenia, unspecified: Secondary | ICD-10-CM | POA: Diagnosis not present

## 2021-09-04 DIAGNOSIS — Z79899 Other long term (current) drug therapy: Secondary | ICD-10-CM | POA: Diagnosis not present

## 2021-09-04 DIAGNOSIS — E119 Type 2 diabetes mellitus without complications: Secondary | ICD-10-CM | POA: Diagnosis not present

## 2021-09-04 DIAGNOSIS — D500 Iron deficiency anemia secondary to blood loss (chronic): Secondary | ICD-10-CM

## 2021-09-04 DIAGNOSIS — Z9049 Acquired absence of other specified parts of digestive tract: Secondary | ICD-10-CM | POA: Insufficient documentation

## 2021-09-04 DIAGNOSIS — N189 Chronic kidney disease, unspecified: Secondary | ICD-10-CM | POA: Insufficient documentation

## 2021-09-04 DIAGNOSIS — I252 Old myocardial infarction: Secondary | ICD-10-CM | POA: Diagnosis not present

## 2021-09-04 DIAGNOSIS — D3A8 Other benign neuroendocrine tumors: Secondary | ICD-10-CM

## 2021-09-04 DIAGNOSIS — C7A098 Malignant carcinoid tumors of other sites: Secondary | ICD-10-CM | POA: Diagnosis not present

## 2021-09-04 DIAGNOSIS — D72810 Lymphocytopenia: Secondary | ICD-10-CM

## 2021-09-04 DIAGNOSIS — C180 Malignant neoplasm of cecum: Secondary | ICD-10-CM

## 2021-09-04 LAB — CBC WITH DIFFERENTIAL/PLATELET
Abs Immature Granulocytes: 0.01 10*3/uL (ref 0.00–0.07)
Basophils Absolute: 0 10*3/uL (ref 0.0–0.1)
Basophils Relative: 1 %
Eosinophils Absolute: 0.2 10*3/uL (ref 0.0–0.5)
Eosinophils Relative: 4 %
HCT: 37.7 % — ABNORMAL LOW (ref 39.0–52.0)
Hemoglobin: 12.2 g/dL — ABNORMAL LOW (ref 13.0–17.0)
Immature Granulocytes: 0 %
Lymphocytes Relative: 20 %
Lymphs Abs: 0.9 10*3/uL (ref 0.7–4.0)
MCH: 28.2 pg (ref 26.0–34.0)
MCHC: 32.4 g/dL (ref 30.0–36.0)
MCV: 87.1 fL (ref 80.0–100.0)
Monocytes Absolute: 0.3 10*3/uL (ref 0.1–1.0)
Monocytes Relative: 6 %
Neutro Abs: 3.3 10*3/uL (ref 1.7–7.7)
Neutrophils Relative %: 69 %
Platelets: 110 10*3/uL — ABNORMAL LOW (ref 150–400)
RBC: 4.33 MIL/uL (ref 4.22–5.81)
RDW: 13.2 % (ref 11.5–15.5)
WBC: 4.7 10*3/uL (ref 4.0–10.5)
nRBC: 0 % (ref 0.0–0.2)

## 2021-09-04 LAB — COMPREHENSIVE METABOLIC PANEL
ALT: 13 U/L (ref 0–44)
AST: 15 U/L (ref 15–41)
Albumin: 4.2 g/dL (ref 3.5–5.0)
Alkaline Phosphatase: 71 U/L (ref 38–126)
Anion gap: 9 (ref 5–15)
BUN: 22 mg/dL (ref 8–23)
CO2: 27 mmol/L (ref 22–32)
Calcium: 8.7 mg/dL — ABNORMAL LOW (ref 8.9–10.3)
Chloride: 101 mmol/L (ref 98–111)
Creatinine, Ser: 1.26 mg/dL — ABNORMAL HIGH (ref 0.61–1.24)
GFR, Estimated: 58 mL/min — ABNORMAL LOW (ref >60)
Glucose, Bld: 161 mg/dL — ABNORMAL HIGH (ref 70–99)
Potassium: 4.6 mmol/L (ref 3.5–5.1)
Sodium: 137 mmol/L (ref 135–145)
Total Bilirubin: 0.8 mg/dL (ref 0.3–1.2)
Total Protein: 7.4 g/dL (ref 6.5–8.1)

## 2021-09-07 LAB — CHROMOGRANIN A: Chromogranin A (ng/mL): 699 ng/mL — ABNORMAL HIGH (ref 0.0–101.8)

## 2021-09-07 LAB — CEA: CEA: 0.9 ng/mL (ref 0.0–4.7)

## 2021-09-08 ENCOUNTER — Other Ambulatory Visit
Admission: RE | Admit: 2021-09-08 | Discharge: 2021-09-08 | Disposition: A | Discharge status: Home / Self Care | Payer: Medicare HMO | Source: Ambulatory Visit | Attending: Gastroenterology | Admitting: Gastroenterology

## 2021-09-08 DIAGNOSIS — D3A8 Other benign neuroendocrine tumors: Secondary | ICD-10-CM | POA: Diagnosis not present

## 2021-09-08 DIAGNOSIS — G301 Alzheimer's disease with late onset: Secondary | ICD-10-CM | POA: Diagnosis not present

## 2021-09-08 DIAGNOSIS — F0280 Dementia in other diseases classified elsewhere without behavioral disturbance: Secondary | ICD-10-CM | POA: Diagnosis not present

## 2021-09-08 DIAGNOSIS — R634 Abnormal weight loss: Secondary | ICD-10-CM | POA: Insufficient documentation

## 2021-09-08 DIAGNOSIS — R152 Fecal urgency: Secondary | ICD-10-CM | POA: Insufficient documentation

## 2021-09-08 DIAGNOSIS — R194 Change in bowel habit: Secondary | ICD-10-CM | POA: Diagnosis not present

## 2021-09-08 DIAGNOSIS — R197 Diarrhea, unspecified: Secondary | ICD-10-CM | POA: Diagnosis not present

## 2021-09-08 DIAGNOSIS — Z85038 Personal history of other malignant neoplasm of large intestine: Secondary | ICD-10-CM | POA: Diagnosis not present

## 2021-09-08 LAB — C DIFFICILE QUICK SCREEN W PCR REFLEX
C Diff antigen: NEGATIVE
C Diff interpretation: NOT DETECTED
C Diff toxin: NEGATIVE

## 2021-09-11 ENCOUNTER — Inpatient Hospital Stay: Payer: Medicare HMO | Admitting: Oncology

## 2021-09-11 ENCOUNTER — Other Ambulatory Visit: Payer: Self-pay

## 2021-09-11 ENCOUNTER — Encounter: Payer: Self-pay | Admitting: Oncology

## 2021-09-11 ENCOUNTER — Ambulatory Visit
Admission: RE | Admit: 2021-09-11 | Discharge: 2021-09-11 | Disposition: A | Discharge status: Home / Self Care | Payer: Self-pay | Source: Ambulatory Visit | Attending: Oncology | Admitting: Oncology

## 2021-09-11 VITALS — BP 97/62 | HR 79 | Temp 97.8°F | Resp 18 | Wt 171.8 lb

## 2021-09-11 DIAGNOSIS — I252 Old myocardial infarction: Secondary | ICD-10-CM | POA: Diagnosis not present

## 2021-09-11 DIAGNOSIS — D696 Thrombocytopenia, unspecified: Secondary | ICD-10-CM

## 2021-09-11 DIAGNOSIS — R197 Diarrhea, unspecified: Secondary | ICD-10-CM | POA: Diagnosis not present

## 2021-09-11 DIAGNOSIS — D3A8 Other benign neuroendocrine tumors: Secondary | ICD-10-CM | POA: Diagnosis not present

## 2021-09-11 DIAGNOSIS — I10 Essential (primary) hypertension: Secondary | ICD-10-CM | POA: Diagnosis not present

## 2021-09-11 DIAGNOSIS — C180 Malignant neoplasm of cecum: Secondary | ICD-10-CM | POA: Diagnosis not present

## 2021-09-11 DIAGNOSIS — C7A098 Malignant carcinoid tumors of other sites: Secondary | ICD-10-CM | POA: Diagnosis not present

## 2021-09-11 DIAGNOSIS — E119 Type 2 diabetes mellitus without complications: Secondary | ICD-10-CM | POA: Diagnosis not present

## 2021-09-11 DIAGNOSIS — D509 Iron deficiency anemia, unspecified: Secondary | ICD-10-CM | POA: Diagnosis not present

## 2021-09-11 DIAGNOSIS — Z85038 Personal history of other malignant neoplasm of large intestine: Secondary | ICD-10-CM | POA: Diagnosis not present

## 2021-09-11 DIAGNOSIS — E785 Hyperlipidemia, unspecified: Secondary | ICD-10-CM | POA: Diagnosis not present
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
## 2021-09-11 NOTE — Progress Notes (Signed)
Pt here for follow up. No new concerns voiced.   

## 2021-09-13 LAB — STOOL CULTURE REFLEX - CMPCXR

## 2021-09-13 LAB — STOOL CULTURE: E coli, Shiga toxin Assay: NEGATIVE

## 2021-09-13 LAB — STOOL CULTURE REFLEX - RSASHR

## 2021-09-15 LAB — CALPROTECTIN, FECAL: Calprotectin, Fecal: 105 ug/g (ref 0–120)

## 2021-09-18 DIAGNOSIS — I499 Cardiac arrhythmia, unspecified: Secondary | ICD-10-CM | POA: Diagnosis not present

## 2021-09-18 DIAGNOSIS — D649 Anemia, unspecified: Secondary | ICD-10-CM | POA: Diagnosis not present

## 2021-09-18 DIAGNOSIS — E139 Other specified diabetes mellitus without complications: Secondary | ICD-10-CM | POA: Diagnosis not present

## 2021-09-18 DIAGNOSIS — Z0000 Encounter for general adult medical examination without abnormal findings: Secondary | ICD-10-CM | POA: Diagnosis not present

## 2021-09-18 DIAGNOSIS — E538 Deficiency of other specified B group vitamins: Secondary | ICD-10-CM | POA: Diagnosis not present

## 2021-09-18 DIAGNOSIS — I25118 Atherosclerotic heart disease of native coronary artery with other forms of angina pectoris: Secondary | ICD-10-CM | POA: Diagnosis not present

## 2021-09-18 DIAGNOSIS — Z23 Encounter for immunization: Secondary | ICD-10-CM | POA: Diagnosis not present

## 2021-09-18 DIAGNOSIS — I10 Essential (primary) hypertension: Secondary | ICD-10-CM | POA: Diagnosis not present

## 2021-09-18 DIAGNOSIS — E785 Hyperlipidemia, unspecified: Secondary | ICD-10-CM | POA: Diagnosis not present

## 2021-09-19 ENCOUNTER — Other Ambulatory Visit: Payer: Self-pay

## 2021-09-19 ENCOUNTER — Ambulatory Visit
Admission: RE | Admit: 2021-09-19 | Discharge: 2021-09-19 | Disposition: A | Discharge status: Home / Self Care | Payer: Medicare HMO | Source: Ambulatory Visit | Attending: Oncology | Admitting: Oncology

## 2021-09-19 DIAGNOSIS — I2510 Atherosclerotic heart disease of native coronary artery without angina pectoris: Secondary | ICD-10-CM | POA: Diagnosis not present

## 2021-09-19 DIAGNOSIS — C254 Malignant neoplasm of endocrine pancreas: Secondary | ICD-10-CM | POA: Diagnosis not present

## 2021-09-19 DIAGNOSIS — Z86012 Personal history of benign carcinoid tumor: Secondary | ICD-10-CM | POA: Diagnosis not present

## 2021-09-19 DIAGNOSIS — I700 Atherosclerosis of aorta: Secondary | ICD-10-CM | POA: Diagnosis not present

## 2021-09-19 DIAGNOSIS — D3A8 Other benign neuroendocrine tumors: Secondary | ICD-10-CM | POA: Insufficient documentation

## 2021-09-19 MED ORDER — GALLIUM GA 68 DOTATATE IV KIT
4.2100 | PACK | Freq: Once | INTRAVENOUS | Status: AC | PRN
  Administered 2021-09-19: 4.42 via INTRAVENOUS

## 2021-11-01 IMAGING — PT NM PET SKULL BASE TO THIGH
1 of 9 series · 1 of 25 positions shown · non-contrast
Comparison: DOTATATE PET scan 05/31/2020

CLINICAL DATA: Pancreatic neuroendocrine tumor.

EXAM:
NUCLEAR MEDICINE PET SKULL BASE TO THIGH
TECHNIQUE: 4.4 mCi gallium 68 DOTATATE was injected intravenously. Full-ring
PET imaging was performed from the skull base to thigh after the
radiotracer. CT data was obtained and used for attenuation
correction and anatomic localization.

[Series 3: ct wb 5.0 b30f · axial · 5.0mm · 0.98mm/px · 1 of 329 slices shown]
[im 329/329  brain]
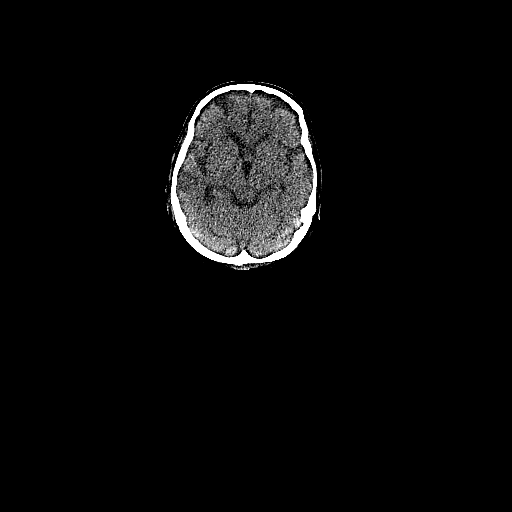

[1 of 25 positions shown; findings below may reference images not displayed]

FINDINGS: NECK

Focal uptake in the region of the inferior pole RIGHT thyroid gland
is less intense than comparison exam with SUV max equal 4.9 compared
SUV max equal 10.8. There is some misregistration through this
region.

Incidental CT findings: None

CHEST

No radiotracer accumulation within mediastinal or hilar lymph nodes.
No suspicious pulmonary nodules on the CT scan.

Incidental CT finding:Coronary artery calcification and aortic
atherosclerotic calcification.

ABDOMEN/PELVIS

Again demonstrated solid mass in the tail the pancreas measuring
by 2.4 cm with intense radiotracer activity (SUV max equal 10.4).
Mass lesion measures 2.8 x 2.4 cm on PET-CT scan 05/31/2020 with SUV
max equal 13.4.

No evidence metastatic neuroendocrine tumor within the liver. No
radiotracer avid periaortic lymph nodes in the retroperitoneum. No
pelvic adenopathy or mesenteric radiotracer avid nodules.

Normal prostate gland.

Physiologic activity noted in the liver, spleen, adrenal glands and
kidneys.

Incidental CT findings:None

SKELETON

No focal activity to suggest skeletal metastasis.

Incidental CT findings:None
IMPRESSION: 1. Similar intensely radiotracer avid solid mass in the tail the
pancreas. Mass lesion measures slightly larger than comparison exam
although there is some differing technique.
2. No evidence of metastatic neuroendocrine tumor within the liver,
lymph nodes, or lung. No new lesions present.
3. Persistent radiotracer uptake in the region of the inferior pole
of the RIGHT thyroid gland. Consider thyroid ultrasound for further
evaluation.

## 2021-12-06 ENCOUNTER — Encounter: Admission: RE | Payer: Self-pay | Source: Home / Self Care

## 2021-12-06 ENCOUNTER — Ambulatory Visit: Admission: RE | Admit: 2021-12-06 | Payer: Medicare HMO | Source: Home / Self Care | Admitting: Internal Medicine

## 2021-12-06 SURGERY — COLONOSCOPY WITH PROPOFOL
Anesthesia: General

## 2022-01-05 ENCOUNTER — Other Ambulatory Visit: Payer: Self-pay | Admitting: Student

## 2022-01-05 ENCOUNTER — Other Ambulatory Visit (HOSPITAL_COMMUNITY): Payer: Self-pay | Admitting: Student

## 2022-01-09 ENCOUNTER — Other Ambulatory Visit: Payer: Self-pay | Admitting: Neurology

## 2022-01-09 DIAGNOSIS — I152 Hypertension secondary to endocrine disorders: Secondary | ICD-10-CM | POA: Diagnosis not present

## 2022-01-09 DIAGNOSIS — F0280 Dementia in other diseases classified elsewhere without behavioral disturbance: Secondary | ICD-10-CM | POA: Diagnosis not present

## 2022-01-09 DIAGNOSIS — E538 Deficiency of other specified B group vitamins: Secondary | ICD-10-CM | POA: Diagnosis not present

## 2022-01-09 DIAGNOSIS — E559 Vitamin D deficiency, unspecified: Secondary | ICD-10-CM | POA: Diagnosis not present

## 2022-01-09 DIAGNOSIS — G8929 Other chronic pain: Secondary | ICD-10-CM

## 2022-01-09 DIAGNOSIS — E1159 Type 2 diabetes mellitus with other circulatory complications: Secondary | ICD-10-CM | POA: Diagnosis not present

## 2022-01-09 DIAGNOSIS — Z8669 Personal history of other diseases of the nervous system and sense organs: Secondary | ICD-10-CM | POA: Diagnosis not present

## 2022-01-09 DIAGNOSIS — G301 Alzheimer's disease with late onset: Secondary | ICD-10-CM | POA: Diagnosis not present

## 2022-01-13 DIAGNOSIS — F0280 Dementia in other diseases classified elsewhere without behavioral disturbance: Secondary | ICD-10-CM | POA: Diagnosis present

## 2022-01-22 ENCOUNTER — Ambulatory Visit
Admission: RE | Admit: 2022-01-22 | Discharge: 2022-01-22 | Disposition: A | Discharge status: Home / Self Care | Payer: Medicare HMO | Source: Ambulatory Visit | Attending: Neurology | Admitting: Neurology

## 2022-01-22 ENCOUNTER — Other Ambulatory Visit: Payer: Self-pay

## 2022-01-22 DIAGNOSIS — R410 Disorientation, unspecified: Secondary | ICD-10-CM | POA: Diagnosis not present

## 2022-01-22 DIAGNOSIS — G8929 Other chronic pain: Secondary | ICD-10-CM | POA: Insufficient documentation

## 2022-01-22 DIAGNOSIS — R413 Other amnesia: Secondary | ICD-10-CM | POA: Diagnosis not present

## 2022-01-22 DIAGNOSIS — R519 Headache, unspecified: Secondary | ICD-10-CM | POA: Diagnosis not present

## 2022-01-22 DIAGNOSIS — G319 Degenerative disease of nervous system, unspecified: Secondary | ICD-10-CM | POA: Diagnosis not present

## 2022-03-12 ENCOUNTER — Inpatient Hospital Stay: Payer: Medicare HMO | Attending: Oncology

## 2022-03-12 DIAGNOSIS — R978 Other abnormal tumor markers: Secondary | ICD-10-CM | POA: Diagnosis not present

## 2022-03-12 DIAGNOSIS — Z85038 Personal history of other malignant neoplasm of large intestine: Secondary | ICD-10-CM | POA: Diagnosis not present

## 2022-03-12 DIAGNOSIS — C180 Malignant neoplasm of cecum: Secondary | ICD-10-CM

## 2022-03-12 LAB — CBC WITH DIFFERENTIAL/PLATELET
Abs Immature Granulocytes: 0.02 10*3/uL (ref 0.00–0.07)
Basophils Absolute: 0 10*3/uL (ref 0.0–0.1)
Basophils Relative: 1 %
Eosinophils Absolute: 0.3 10*3/uL (ref 0.0–0.5)
Eosinophils Relative: 6 %
HCT: 38.8 % — ABNORMAL LOW (ref 39.0–52.0)
Hemoglobin: 12.4 g/dL — ABNORMAL LOW (ref 13.0–17.0)
Immature Granulocytes: 0 %
Lymphocytes Relative: 20 %
Lymphs Abs: 0.9 10*3/uL (ref 0.7–4.0)
MCH: 27.3 pg (ref 26.0–34.0)
MCHC: 32 g/dL (ref 30.0–36.0)
MCV: 85.3 fL (ref 80.0–100.0)
Monocytes Absolute: 0.4 10*3/uL (ref 0.1–1.0)
Monocytes Relative: 8 %
Neutro Abs: 3 10*3/uL (ref 1.7–7.7)
Neutrophils Relative %: 65 %
Platelets: 102 10*3/uL — ABNORMAL LOW (ref 150–400)
RBC: 4.55 MIL/uL (ref 4.22–5.81)
RDW: 13.8 % (ref 11.5–15.5)
WBC: 4.6 10*3/uL (ref 4.0–10.5)
nRBC: 0 % (ref 0.0–0.2)

## 2022-03-12 LAB — COMPREHENSIVE METABOLIC PANEL
ALT: 10 U/L (ref 0–44)
AST: 14 U/L — ABNORMAL LOW (ref 15–41)
Albumin: 4.1 g/dL (ref 3.5–5.0)
Alkaline Phosphatase: 68 U/L (ref 38–126)
Anion gap: 6 (ref 5–15)
BUN: 23 mg/dL (ref 8–23)
CO2: 27 mmol/L (ref 22–32)
Calcium: 8.2 mg/dL — ABNORMAL LOW (ref 8.9–10.3)
Chloride: 103 mmol/L (ref 98–111)
Creatinine, Ser: 1.5 mg/dL — ABNORMAL HIGH (ref 0.61–1.24)
GFR, Estimated: 46 mL/min — ABNORMAL LOW (ref >60)
Glucose, Bld: 77 mg/dL (ref 70–99)
Potassium: 5 mmol/L (ref 3.5–5.1)
Sodium: 136 mmol/L (ref 135–145)
Total Bilirubin: 0.6 mg/dL (ref 0.3–1.2)
Total Protein: 7 g/dL (ref 6.5–8.1)

## 2022-03-13 LAB — CEA: CEA: 1.1 ng/mL (ref 0.0–4.7)

## 2022-03-14 LAB — CHROMOGRANIN A: Chromogranin A (ng/mL): 1359 ng/mL — ABNORMAL HIGH (ref 0.0–101.8)

## 2022-03-19 ENCOUNTER — Inpatient Hospital Stay: Payer: Medicare HMO | Admitting: Oncology

## 2022-03-30 ENCOUNTER — Encounter: Payer: Self-pay | Admitting: Oncology

## 2022-03-30 ENCOUNTER — Inpatient Hospital Stay: Payer: Medicare HMO | Admitting: Oncology

## 2022-03-30 VITALS — BP 132/69 | HR 73 | Temp 97.0°F | Wt 175.0 lb

## 2022-03-30 DIAGNOSIS — E041 Nontoxic single thyroid nodule: Secondary | ICD-10-CM | POA: Diagnosis not present

## 2022-03-30 DIAGNOSIS — D3A8 Other benign neuroendocrine tumors: Secondary | ICD-10-CM | POA: Diagnosis not present

## 2022-03-30 DIAGNOSIS — D696 Thrombocytopenia, unspecified: Secondary | ICD-10-CM

## 2022-03-30 DIAGNOSIS — D649 Anemia, unspecified: Secondary | ICD-10-CM | POA: Diagnosis not present

## 2022-03-30 DIAGNOSIS — C180 Malignant neoplasm of cecum: Secondary | ICD-10-CM

## 2022-03-30 DIAGNOSIS — Z85038 Personal history of other malignant neoplasm of large intestine: Secondary | ICD-10-CM | POA: Diagnosis not present

## 2022-03-30 DIAGNOSIS — R978 Other abnormal tumor markers: Secondary | ICD-10-CM | POA: Diagnosis not present
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
## 2022-03-30 NOTE — Patient Instructions (Signed)
Please hold Omeprazole at least 3 weeks to labs in October.  ?

## 2022-04-20 ENCOUNTER — Inpatient Hospital Stay: Payer: Medicare HMO

## 2022-04-23 ENCOUNTER — Inpatient Hospital Stay: Payer: Medicare HMO | Attending: Oncology

## 2022-04-23 DIAGNOSIS — Z85038 Personal history of other malignant neoplasm of large intestine: Secondary | ICD-10-CM | POA: Diagnosis not present

## 2022-04-23 DIAGNOSIS — D3A8 Other benign neuroendocrine tumors: Secondary | ICD-10-CM

## 2022-04-23 DIAGNOSIS — R978 Other abnormal tumor markers: Secondary | ICD-10-CM | POA: Diagnosis not present

## 2022-04-25 DIAGNOSIS — E785 Hyperlipidemia, unspecified: Secondary | ICD-10-CM | POA: Diagnosis not present

## 2022-04-25 DIAGNOSIS — E1169 Type 2 diabetes mellitus with other specified complication: Secondary | ICD-10-CM | POA: Diagnosis not present

## 2022-04-25 DIAGNOSIS — E1159 Type 2 diabetes mellitus with other circulatory complications: Secondary | ICD-10-CM | POA: Diagnosis not present

## 2022-04-25 DIAGNOSIS — I152 Hypertension secondary to endocrine disorders: Secondary | ICD-10-CM | POA: Diagnosis not present

## 2022-04-25 DIAGNOSIS — Z794 Long term (current) use of insulin: Secondary | ICD-10-CM | POA: Diagnosis not present

## 2022-04-26 LAB — CHROMOGRANIN A: Chromogranin A (ng/mL): 105.6 ng/mL — ABNORMAL HIGH (ref 0.0–101.8)

## 2022-05-15 DIAGNOSIS — G309 Alzheimer's disease, unspecified: Secondary | ICD-10-CM | POA: Diagnosis not present

## 2022-05-15 DIAGNOSIS — F0150 Vascular dementia without behavioral disturbance: Secondary | ICD-10-CM | POA: Diagnosis not present

## 2022-05-15 DIAGNOSIS — Z8639 Personal history of other endocrine, nutritional and metabolic disease: Secondary | ICD-10-CM | POA: Diagnosis not present

## 2022-05-15 DIAGNOSIS — Z85038 Personal history of other malignant neoplasm of large intestine: Secondary | ICD-10-CM | POA: Diagnosis not present

## 2022-05-15 DIAGNOSIS — Z8669 Personal history of other diseases of the nervous system and sense organs: Secondary | ICD-10-CM | POA: Diagnosis not present

## 2022-05-15 DIAGNOSIS — F0280 Dementia in other diseases classified elsewhere without behavioral disturbance: Secondary | ICD-10-CM | POA: Diagnosis not present

## 2022-09-12 DIAGNOSIS — F0150 Vascular dementia without behavioral disturbance: Secondary | ICD-10-CM | POA: Diagnosis not present

## 2022-09-12 DIAGNOSIS — F0280 Dementia in other diseases classified elsewhere without behavioral disturbance: Secondary | ICD-10-CM | POA: Diagnosis not present

## 2022-09-12 DIAGNOSIS — G309 Alzheimer's disease, unspecified: Secondary | ICD-10-CM | POA: Diagnosis not present

## 2022-09-12 DIAGNOSIS — Z85038 Personal history of other malignant neoplasm of large intestine: Secondary | ICD-10-CM | POA: Diagnosis not present

## 2022-09-12 DIAGNOSIS — Z8669 Personal history of other diseases of the nervous system and sense organs: Secondary | ICD-10-CM | POA: Diagnosis not present

## 2022-09-18 DIAGNOSIS — I10 Essential (primary) hypertension: Secondary | ICD-10-CM | POA: Diagnosis not present

## 2022-09-25 ENCOUNTER — Inpatient Hospital Stay: Payer: Medicare HMO | Attending: Oncology

## 2022-09-28 ENCOUNTER — Encounter: Payer: Self-pay | Admitting: Oncology

## 2022-09-28 ENCOUNTER — Inpatient Hospital Stay: Payer: Medicare HMO | Attending: Oncology | Admitting: Oncology

## 2022-09-28 DIAGNOSIS — D3A8 Other benign neuroendocrine tumors: Secondary | ICD-10-CM | POA: Insufficient documentation

## 2022-09-28 NOTE — Assessment & Plan Note (Deleted)
#  History of right side colon cancer, stage I.-Status post right hemicolectomy on 08/12/2019. CEA stable. Continue surveillance.  He and family have declined further colonoscopy surveillance.

## 2022-09-28 NOTE — Assessment & Plan Note (Deleted)
#  Pancreatic neuroendocrine tumor, 2.6 cm. Elevated chromogranin level. 09/19/2021, dotatate PET scan showed similar intensity radiotracer avid solid mass in the tail of the pancreas.  Slightly larger than comparison examination.  No evidence of metastatic disease.  Persistent radiotracer uptake in the region of the inferior pole  of the right thyroid gland.  Given patient's multiple medical problems including dementia, I recommend to continue observation. 03/12/2022 chromogranin level is elevated, patient is on omeprazole 40 mg daily.  Recommend patient to hold off on the positive for 2 to 3 weeks and retest chromogranin level. If chromogranin level is persistently high despite this discontinuation of omeprazole, plan to repeat dotatate PET scan.  Otherwise I will repeat dotatate PET scan annually.

## 2022-10-30 DIAGNOSIS — E538 Deficiency of other specified B group vitamins: Secondary | ICD-10-CM | POA: Diagnosis not present

## 2022-10-30 DIAGNOSIS — G301 Alzheimer's disease with late onset: Secondary | ICD-10-CM | POA: Diagnosis not present

## 2022-10-30 DIAGNOSIS — I10 Essential (primary) hypertension: Secondary | ICD-10-CM | POA: Diagnosis not present

## 2022-10-30 DIAGNOSIS — Z87891 Personal history of nicotine dependence: Secondary | ICD-10-CM | POA: Diagnosis not present

## 2022-10-30 DIAGNOSIS — E785 Hyperlipidemia, unspecified: Secondary | ICD-10-CM | POA: Diagnosis not present

## 2022-10-30 DIAGNOSIS — C259 Malignant neoplasm of pancreas, unspecified: Secondary | ICD-10-CM | POA: Diagnosis not present

## 2022-10-30 DIAGNOSIS — E1322 Other specified diabetes mellitus with diabetic chronic kidney disease: Secondary | ICD-10-CM | POA: Diagnosis not present

## 2022-10-30 DIAGNOSIS — I129 Hypertensive chronic kidney disease with stage 1 through stage 4 chronic kidney disease, or unspecified chronic kidney disease: Secondary | ICD-10-CM | POA: Diagnosis not present

## 2022-10-30 DIAGNOSIS — E559 Vitamin D deficiency, unspecified: Secondary | ICD-10-CM | POA: Diagnosis not present

## 2022-10-30 DIAGNOSIS — I25118 Atherosclerotic heart disease of native coronary artery with other forms of angina pectoris: Secondary | ICD-10-CM | POA: Diagnosis not present

## 2022-10-30 DIAGNOSIS — N183 Chronic kidney disease, stage 3 unspecified: Secondary | ICD-10-CM | POA: Diagnosis not present

## 2022-10-30 DIAGNOSIS — Z125 Encounter for screening for malignant neoplasm of prostate: Secondary | ICD-10-CM | POA: Diagnosis not present

## 2022-10-30 DIAGNOSIS — Z1331 Encounter for screening for depression: Secondary | ICD-10-CM | POA: Diagnosis not present

## 2022-10-30 DIAGNOSIS — Z0000 Encounter for general adult medical examination without abnormal findings: Secondary | ICD-10-CM | POA: Diagnosis not present

## 2022-10-31 DIAGNOSIS — Z794 Long term (current) use of insulin: Secondary | ICD-10-CM | POA: Diagnosis not present

## 2022-10-31 DIAGNOSIS — E1169 Type 2 diabetes mellitus with other specified complication: Secondary | ICD-10-CM | POA: Diagnosis not present

## 2022-10-31 DIAGNOSIS — E785 Hyperlipidemia, unspecified: Secondary | ICD-10-CM | POA: Diagnosis not present

## 2022-10-31 DIAGNOSIS — E1159 Type 2 diabetes mellitus with other circulatory complications: Secondary | ICD-10-CM | POA: Diagnosis not present

## 2022-10-31 DIAGNOSIS — I152 Hypertension secondary to endocrine disorders: Secondary | ICD-10-CM | POA: Diagnosis not present

## 2022-11-09 DIAGNOSIS — E538 Deficiency of other specified B group vitamins: Secondary | ICD-10-CM | POA: Diagnosis not present

## 2022-11-09 DIAGNOSIS — Z23 Encounter for immunization: Secondary | ICD-10-CM | POA: Diagnosis not present

## 2022-11-11 ENCOUNTER — Inpatient Hospital Stay
Admission: EM | Admit: 2022-11-11 | Discharge: 2022-11-14 | DRG: 871 | Disposition: A | Discharge status: Home / Self Care | Payer: Medicare HMO | Attending: Internal Medicine | Admitting: Internal Medicine

## 2022-11-11 ENCOUNTER — Other Ambulatory Visit: Payer: Self-pay

## 2022-11-11 ENCOUNTER — Emergency Department: Payer: Medicare HMO

## 2022-11-11 DIAGNOSIS — F0280 Dementia in other diseases classified elsewhere without behavioral disturbance: Secondary | ICD-10-CM | POA: Diagnosis present

## 2022-11-11 DIAGNOSIS — K5730 Diverticulosis of large intestine without perforation or abscess without bleeding: Secondary | ICD-10-CM | POA: Diagnosis not present

## 2022-11-11 DIAGNOSIS — A419 Sepsis, unspecified organism: Secondary | ICD-10-CM | POA: Diagnosis not present

## 2022-11-11 DIAGNOSIS — Z20822 Contact with and (suspected) exposure to covid-19: Secondary | ICD-10-CM | POA: Diagnosis not present

## 2022-11-11 DIAGNOSIS — Z96642 Presence of left artificial hip joint: Secondary | ICD-10-CM | POA: Diagnosis present

## 2022-11-11 DIAGNOSIS — G9341 Metabolic encephalopathy: Secondary | ICD-10-CM | POA: Diagnosis present

## 2022-11-11 DIAGNOSIS — N3000 Acute cystitis without hematuria: Secondary | ICD-10-CM | POA: Diagnosis not present

## 2022-11-11 DIAGNOSIS — I129 Hypertensive chronic kidney disease with stage 1 through stage 4 chronic kidney disease, or unspecified chronic kidney disease: Secondary | ICD-10-CM | POA: Diagnosis present

## 2022-11-11 DIAGNOSIS — D509 Iron deficiency anemia, unspecified: Secondary | ICD-10-CM | POA: Diagnosis present

## 2022-11-11 DIAGNOSIS — D61818 Other pancytopenia: Secondary | ICD-10-CM | POA: Diagnosis not present

## 2022-11-11 DIAGNOSIS — N179 Acute kidney failure, unspecified: Secondary | ICD-10-CM | POA: Diagnosis not present

## 2022-11-11 DIAGNOSIS — G301 Alzheimer's disease with late onset: Secondary | ICD-10-CM | POA: Diagnosis present

## 2022-11-11 DIAGNOSIS — Z87891 Personal history of nicotine dependence: Secondary | ICD-10-CM

## 2022-11-11 DIAGNOSIS — Z820 Family history of epilepsy and other diseases of the nervous system: Secondary | ICD-10-CM

## 2022-11-11 DIAGNOSIS — Z9049 Acquired absence of other specified parts of digestive tract: Secondary | ICD-10-CM

## 2022-11-11 DIAGNOSIS — D3A8 Other benign neuroendocrine tumors: Secondary | ICD-10-CM | POA: Diagnosis present

## 2022-11-11 DIAGNOSIS — K869 Disease of pancreas, unspecified: Secondary | ICD-10-CM | POA: Diagnosis not present

## 2022-11-11 DIAGNOSIS — F0390 Unspecified dementia without behavioral disturbance: Secondary | ICD-10-CM | POA: Diagnosis not present

## 2022-11-11 DIAGNOSIS — R531 Weakness: Secondary | ICD-10-CM | POA: Diagnosis not present

## 2022-11-11 DIAGNOSIS — Z7984 Long term (current) use of oral hypoglycemic drugs: Secondary | ICD-10-CM

## 2022-11-11 DIAGNOSIS — Z9842 Cataract extraction status, left eye: Secondary | ICD-10-CM

## 2022-11-11 DIAGNOSIS — E1122 Type 2 diabetes mellitus with diabetic chronic kidney disease: Secondary | ICD-10-CM | POA: Diagnosis not present

## 2022-11-11 DIAGNOSIS — Z9841 Cataract extraction status, right eye: Secondary | ICD-10-CM

## 2022-11-11 DIAGNOSIS — I10 Essential (primary) hypertension: Secondary | ICD-10-CM | POA: Diagnosis not present

## 2022-11-11 DIAGNOSIS — N390 Urinary tract infection, site not specified: Secondary | ICD-10-CM

## 2022-11-11 DIAGNOSIS — I2510 Atherosclerotic heart disease of native coronary artery without angina pectoris: Secondary | ICD-10-CM | POA: Insufficient documentation

## 2022-11-11 DIAGNOSIS — N1831 Chronic kidney disease, stage 3a: Secondary | ICD-10-CM | POA: Diagnosis present

## 2022-11-11 DIAGNOSIS — G9340 Encephalopathy, unspecified: Secondary | ICD-10-CM

## 2022-11-11 DIAGNOSIS — R6520 Severe sepsis without septic shock: Secondary | ICD-10-CM | POA: Diagnosis not present

## 2022-11-11 DIAGNOSIS — I252 Old myocardial infarction: Secondary | ICD-10-CM

## 2022-11-11 DIAGNOSIS — Z961 Presence of intraocular lens: Secondary | ICD-10-CM | POA: Diagnosis present

## 2022-11-11 DIAGNOSIS — D6949 Other primary thrombocytopenia: Secondary | ICD-10-CM | POA: Diagnosis not present

## 2022-11-11 DIAGNOSIS — E785 Hyperlipidemia, unspecified: Secondary | ICD-10-CM | POA: Diagnosis present

## 2022-11-11 DIAGNOSIS — E119 Type 2 diabetes mellitus without complications: Secondary | ICD-10-CM

## 2022-11-11 DIAGNOSIS — C189 Malignant neoplasm of colon, unspecified: Secondary | ICD-10-CM | POA: Diagnosis present

## 2022-11-11 DIAGNOSIS — B952 Enterococcus as the cause of diseases classified elsewhere: Secondary | ICD-10-CM | POA: Diagnosis present

## 2022-11-11 DIAGNOSIS — F05 Delirium due to known physiological condition: Secondary | ICD-10-CM | POA: Diagnosis present

## 2022-11-11 DIAGNOSIS — R509 Fever, unspecified: Secondary | ICD-10-CM | POA: Diagnosis not present

## 2022-11-11 DIAGNOSIS — K219 Gastro-esophageal reflux disease without esophagitis: Secondary | ICD-10-CM | POA: Diagnosis present

## 2022-11-11 DIAGNOSIS — Z79899 Other long term (current) drug therapy: Secondary | ICD-10-CM

## 2022-11-11 DIAGNOSIS — R32 Unspecified urinary incontinence: Secondary | ICD-10-CM | POA: Diagnosis present

## 2022-11-11 DIAGNOSIS — R739 Hyperglycemia, unspecified: Secondary | ICD-10-CM | POA: Diagnosis not present

## 2022-11-11 DIAGNOSIS — I959 Hypotension, unspecified: Secondary | ICD-10-CM | POA: Diagnosis not present

## 2022-11-11 DIAGNOSIS — Z8249 Family history of ischemic heart disease and other diseases of the circulatory system: Secondary | ICD-10-CM

## 2022-11-11 DIAGNOSIS — Z85038 Personal history of other malignant neoplasm of large intestine: Secondary | ICD-10-CM

## 2022-11-11 DIAGNOSIS — D7389 Other diseases of spleen: Secondary | ICD-10-CM | POA: Diagnosis not present

## 2022-11-11 DIAGNOSIS — R4182 Altered mental status, unspecified: Secondary | ICD-10-CM | POA: Diagnosis not present

## 2022-11-11 LAB — CBC WITH DIFFERENTIAL/PLATELET
Abs Immature Granulocytes: 0.03 10*3/uL (ref 0.00–0.07)
Basophils Absolute: 0 10*3/uL (ref 0.0–0.1)
Basophils Relative: 0 %
Eosinophils Absolute: 0 10*3/uL (ref 0.0–0.5)
Eosinophils Relative: 0 %
HCT: 31.5 % — ABNORMAL LOW (ref 39.0–52.0)
Hemoglobin: 10.1 g/dL — ABNORMAL LOW (ref 13.0–17.0)
Immature Granulocytes: 0 %
Lymphocytes Relative: 6 %
Lymphs Abs: 0.4 10*3/uL — ABNORMAL LOW (ref 0.7–4.0)
MCH: 28.2 pg (ref 26.0–34.0)
MCHC: 32.1 g/dL (ref 30.0–36.0)
MCV: 88 fL (ref 80.0–100.0)
Monocytes Absolute: 0.6 10*3/uL (ref 0.1–1.0)
Monocytes Relative: 8 %
Neutro Abs: 6.2 10*3/uL (ref 1.7–7.7)
Neutrophils Relative %: 86 %
Platelets: 74 10*3/uL — ABNORMAL LOW (ref 150–400)
RBC: 3.58 MIL/uL — ABNORMAL LOW (ref 4.22–5.81)
RDW: 13 % (ref 11.5–15.5)
WBC: 7.2 10*3/uL (ref 4.0–10.5)
nRBC: 0 % (ref 0.0–0.2)

## 2022-11-11 LAB — COMPREHENSIVE METABOLIC PANEL
ALT: 9 U/L (ref 0–44)
AST: 18 U/L (ref 15–41)
Albumin: 3.4 g/dL — ABNORMAL LOW (ref 3.5–5.0)
Alkaline Phosphatase: 58 U/L (ref 38–126)
Anion gap: 11 (ref 5–15)
BUN: 35 mg/dL — ABNORMAL HIGH (ref 8–23)
CO2: 19 mmol/L — ABNORMAL LOW (ref 22–32)
Calcium: 8.3 mg/dL — ABNORMAL LOW (ref 8.9–10.3)
Chloride: 104 mmol/L (ref 98–111)
Creatinine, Ser: 2.17 mg/dL — ABNORMAL HIGH (ref 0.61–1.24)
GFR, Estimated: 30 mL/min — ABNORMAL LOW (ref >60)
Glucose, Bld: 160 mg/dL — ABNORMAL HIGH (ref 70–99)
Potassium: 4.5 mmol/L (ref 3.5–5.1)
Sodium: 134 mmol/L — ABNORMAL LOW (ref 135–145)
Total Bilirubin: 1.1 mg/dL (ref 0.3–1.2)
Total Protein: 6.4 g/dL — ABNORMAL LOW (ref 6.5–8.1)

## 2022-11-11 LAB — URINALYSIS, COMPLETE (UACMP) WITH MICROSCOPIC
Bilirubin Urine: NEGATIVE
Glucose, UA: NEGATIVE mg/dL
Ketones, ur: NEGATIVE mg/dL
Nitrite: NEGATIVE
Protein, ur: 100 mg/dL — AB
Specific Gravity, Urine: 1.019 (ref 1.005–1.030)
Squamous Epithelial / HPF: NONE SEEN (ref 0–5)
WBC, UA: >50 WBC/hpf — ABNORMAL HIGH (ref 0–5)
pH: 5 (ref 5.0–8.0)

## 2022-11-11 LAB — RESP PANEL BY RT-PCR (FLU A&B, COVID) ARPGX2
Influenza A by PCR: NEGATIVE
Influenza B by PCR: NEGATIVE
SARS Coronavirus 2 by RT PCR: NEGATIVE

## 2022-11-11 LAB — LACTIC ACID, PLASMA
Lactic Acid, Venous: 3.1 mmol/L (ref 0.5–1.9)
Lactic Acid, Venous: 2.8 mmol/L (ref 0.5–1.9)

## 2022-11-11 LAB — PROTIME-INR
INR: 1.3 — ABNORMAL HIGH (ref 0.8–1.2)
Prothrombin Time: 16.4 seconds — ABNORMAL HIGH (ref 11.4–15.2)

## 2022-11-11 LAB — APTT: aPTT: 34 seconds (ref 24–36)

## 2022-11-11 MED ORDER — LACTATED RINGERS IV BOLUS
500.0000 mL | Freq: Once | INTRAVENOUS | Status: AC
  Administered 2022-11-11: 500 mL via INTRAVENOUS

## 2022-11-11 MED ORDER — ACETAMINOPHEN 650 MG RE SUPP: 650.0000 mg | Freq: Four times a day (QID) | RECTAL | Status: DC | PRN

## 2022-11-11 MED ORDER — SODIUM CHLORIDE 0.9 % IV SOLN
1.0000 g | Freq: Once | INTRAVENOUS | Status: AC
  Administered 2022-11-11: 1 g via INTRAVENOUS
  Filled 2022-11-11: qty 10

## 2022-11-11 MED ORDER — DONEPEZIL HCL 5 MG PO TABS
10.0000 mg | ORAL_TABLET | Freq: Every day | ORAL | Status: DC
  Administered 2022-11-12 – 2022-11-13 (×2): 10 mg via ORAL
  Filled 2022-11-11 (×3): qty 2

## 2022-11-11 MED ORDER — HYDROCODONE-ACETAMINOPHEN 5-325 MG PO TABS: 1.0000 | ORAL_TABLET | ORAL | Status: DC | PRN

## 2022-11-11 MED ORDER — SIMVASTATIN 20 MG PO TABS
40.0000 mg | ORAL_TABLET | Freq: Every evening | ORAL | Status: DC
  Administered 2022-11-12 – 2022-11-13 (×2): 40 mg via ORAL
  Filled 2022-11-11: qty 2
  Filled 2022-11-11: qty 4

## 2022-11-11 MED ORDER — SODIUM CHLORIDE 0.9 % IV SOLN
1.0000 g | Freq: Once | INTRAVENOUS | Status: AC
  Administered 2022-11-12: 1 g via INTRAVENOUS
  Filled 2022-11-11: qty 10

## 2022-11-11 MED ORDER — INSULIN ASPART 100 UNIT/ML IJ SOLN
0.0000 [IU] | INTRAMUSCULAR | Status: DC
  Administered 2022-11-13: 2 [IU] via SUBCUTANEOUS
  Filled 2022-11-11: qty 1

## 2022-11-11 MED ORDER — MEMANTINE HCL 5 MG PO TABS
10.0000 mg | ORAL_TABLET | Freq: Every day | ORAL | Status: DC
  Administered 2022-11-12 – 2022-11-13 (×2): 10 mg via ORAL
  Filled 2022-11-11 (×2): qty 2

## 2022-11-11 MED ORDER — ONDANSETRON HCL 4 MG/2ML IJ SOLN: 4.0000 mg | Freq: Four times a day (QID) | INTRAMUSCULAR | Status: DC | PRN

## 2022-11-11 MED ORDER — ESCITALOPRAM OXALATE 10 MG PO TABS
5.0000 mg | ORAL_TABLET | Freq: Every day | ORAL | Status: DC
  Administered 2022-11-12 – 2022-11-13 (×3): 5 mg via ORAL
  Filled 2022-11-11 (×4): qty 1

## 2022-11-11 MED ORDER — LACTATED RINGERS IV BOLUS
1000.0000 mL | Freq: Once | INTRAVENOUS | Status: AC
  Administered 2022-11-11: 1000 mL via INTRAVENOUS

## 2022-11-11 MED ORDER — ACETAMINOPHEN 325 MG PO TABS: 650.0000 mg | ORAL_TABLET | Freq: Four times a day (QID) | ORAL | Status: DC | PRN

## 2022-11-11 MED ORDER — SODIUM CHLORIDE 0.9 % IV SOLN
2.0000 g | INTRAVENOUS | Status: DC
  Administered 2022-11-12: 2 g via INTRAVENOUS
  Filled 2022-11-11 (×2): qty 20

## 2022-11-11 MED ORDER — ACETAMINOPHEN 500 MG PO TABS
1000.0000 mg | ORAL_TABLET | Freq: Once | ORAL | Status: AC
  Administered 2022-11-11: 1000 mg via ORAL
  Filled 2022-11-11: qty 2

## 2022-11-11 MED ORDER — ONDANSETRON HCL 4 MG PO TABS: 4.0000 mg | ORAL_TABLET | Freq: Four times a day (QID) | ORAL | Status: DC | PRN

## 2022-11-11 MED ORDER — LACTATED RINGERS IV SOLN: INTRAVENOUS | Status: DC

## 2022-11-11 NOTE — ED Triage Notes (Addendum)
Pt brought in via ems from home due to fever of 102.3 and generalized weakness. Ems states pt  also co of incontinence. Pt s received a flu shot 3 days ago and he and his wife have been sick since according to ems.

## 2022-11-11 NOTE — Assessment & Plan Note (Signed)
Sliding scale insulin coverage.  Hold home oral hypoglycemics for now
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
## 2022-11-11 NOTE — Assessment & Plan Note (Addendum)
Creatinine 2.17, up from baseline of 1.5, secondary to sepsis Expecting improvement to baseline with IV fluid resuscitation and treatment of sepsis Avoid nephrotoxins Hold home metformin and lisinopril and HCTZ

## 2022-11-11 NOTE — Assessment & Plan Note (Signed)
No acute issues suspected 

## 2022-11-11 NOTE — Assessment & Plan Note (Addendum)
No acute issues suspected 

## 2022-11-11 NOTE — Assessment & Plan Note (Signed)
Platelets 74,000 down from baseline of 100,000 SCD for DVT prophylaxis until it recovers

## 2022-11-11 NOTE — Assessment & Plan Note (Addendum)
Holding home antihypertensives of HCTZ, lisinopril due to hypotension related to sepsis.  Can resume as sepsis physiology resolve

## 2022-11-11 NOTE — Assessment & Plan Note (Signed)
Secondary to sepsis Neurologic checks and fall and aspiration precautions Will keep n.p.o. tonight until more alert

## 2022-11-11 NOTE — Assessment & Plan Note (Signed)
Continue donepezil, memantine and escitalopram Delirium precautions Haldol as needed delirium

## 2022-11-11 NOTE — ED Notes (Signed)
Lactic acid 3.1. Md made aware.

## 2022-11-11 NOTE — Assessment & Plan Note (Signed)
Continue simvastatin. 

## 2022-11-11 NOTE — Assessment & Plan Note (Signed)
Urinary tract infection Severe sepsis criteria includes fever, tachycardia, hypotension lactic acidosis, AKI and acute encephalopathy Continue sepsis fluids Continue Rocephin Follow cultures

## 2022-11-11 NOTE — ED Provider Notes (Addendum)
[facility] Provider Note    Event Date/Time   First MD Initiated Contact with Patient 11/11/22 1826     (approximate)   History   Fever and Weakness   HPI  Daniel Sherman is a 81 y.o. male past medical history of type 2 diabetes hypertension hyperlipidemia GERD colon cancer status post right hemicolectomy coronary disease and dementia who presents because of altered mental status and fever.  Patient is not able to provide history at all given his mental status and dementia.  Per EMS he got a flu shot about 3 days ago his wife got sick and then next he got sick.  Has had a fever and has had urinary incontinence.  Typically he is able to tell his family when he needs to go to the bathroom but has not been able to and has been less communicative.  Unclear exactly what his baseline is.     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   Gastroenteritis 06/17/2021   Abdominal pain    Nausea    Calculus of gallbladder with acute cholecystitis without obstruction    Goals of care, counseling/discussion 04/03/2020   IDA (iron deficiency anemia) 02/22/2020   Colon cancer ([place]) 08/12/2019   Hepatitis    Septicemia ([place])    SIRS (systemic inflammatory response syndrome) ([place]) 12/09/2016   Abnormal LFTs 12/09/2016   Acute viral syndrome 12/09/2016   S/P left THA, 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    Obese 02/16/2015   KNEE PAIN, LEFT 10/21/2008   Diabetes mellitus without complication ([place]) 60/60/0459   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   Primary thrombocytopenia ([place]) 06/04/2007   Essential hypertension 06/04/2007     Physical Exam  Triage Vital Signs: ED Triage Vitals  Enc Vitals Group     BP 11/11/22 1826 (!) 113/52     Pulse Rate 11/11/22 1826 93     Resp 11/11/22 1826 (!) 22     Temp 11/11/22 1826 (!) 102.7 F (39.3 C)     Temp Source 11/11/22 1826 Rectal     SpO2 11/11/22 1826 91 %     Weight --      Height --      Head Circumference --      Peak Flow --      Pain Score 11/11/22 1820 0     Pain Loc --      Pain Edu? --      Excl. in [place]? --     Most recent vital signs: Vitals:   11/11/22 1826 11/11/22 2103  BP: (!) 113/52 108/71  Pulse: 93   Resp: (!) 22   Temp: (!) 102.7 F (39.3 C)   SpO2: 91%      General: Awake, no distress.  Patient warm to touch  CV:  Good peripheral perfusion.  No peripheral edema Resp:  Normal effort.  No increased work of breathing Abd:  No distention.  Hyperactive bowel sounds, nontender throughout Neuro:             Awake, Alert, Oriented x 1 to person only Other:  No meningismus supple neck able to range fully Patient moves all of his extremities symmetrically has difficulty following commands   ED Results / Procedures / Treatments  Labs (all labs ordered are listed, but only abnormal results are displayed) Labs Reviewed  LACTIC ACID, PLASMA - Abnormal; Notable for the following components:      Result Value   Lactic Acid, Venous 3.1 (*)    All other components within normal limits  COMPREHENSIVE METABOLIC PANEL - Abnormal; Notable for the following components:   Sodium 134 (*)    CO2 19 (*)    Glucose, Bld 160 (*)    BUN 35 (*)    Creatinine, Ser 2.17 (*)    Calcium 8.3 (*)    Total Protein 6.4 (*)    Albumin 3.4 (*)    GFR, Estimated 30 (*)    All other components within normal limits  CBC WITH  DIFFERENTIAL/PLATELET - Abnormal; Notable for the following components:   RBC 3.58 (*)    Hemoglobin 10.1 (*)    HCT 31.5 (*)    Platelets 74 (*)    Lymphs Abs 0.4 (*)    All other components within normal limits  PROTIME-INR - Abnormal; Notable for the following components:   Prothrombin Time 16.4 (*)    INR 1.3 (*)    All other components within normal limits  URINALYSIS, COMPLETE (UACMP) WITH MICROSCOPIC - Abnormal; Notable for the following components:   Color, Urine YELLOW (*)    APPearance HAZY (*)    Hgb urine dipstick SMALL (*)    Protein, ur 100 (*)    Leukocytes,Ua LARGE (*)    WBC, UA >50 (*)    Bacteria, UA MANY (*)    All other components within normal limits  RESP PANEL BY RT-PCR (FLU A&B, COVID) ARPGX2  CULTURE, BLOOD (ROUTINE X 2)  CULTURE, BLOOD (ROUTINE X 2)  URINE CULTURE  APTT  LACTIC ACID, PLASMA     EKG  EKG reviewed interpreted by myself shows sinus rhythm normal axis deep inferior Q waves no acute ischemic changes   RADIOLOGY I reviewed and interpreted the CT scan of the brain which does not show any acute intracranial process    PROCEDURES:  Critical Care performed: Yes, see critical care procedure note(s)  .1-3 Lead EKG Interpretation  Performed by: Rada Hay, MD Authorized by: Rada Hay, MD     Interpretation: normal     ECG rate assessment: normal     Rhythm: sinus rhythm     Ectopy: none     Conduction: normal     The patient is on the cardiac monitor to evaluate for evidence of arrhythmia and/or significant heart rate changes.   MEDICATIONS ORDERED IN ED: Medications  lactated ringers bolus 500 mL (has no administration in time range)  lactated ringers bolus 1,000 mL (0 mLs Intravenous Stopped 11/11/22 2111)  acetaminophen (TYLENOL) tablet 1,000 mg (1,000 mg Oral Given 11/11/22 1847)  cefTRIAXone (ROCEPHIN) 1 g in sodium chloride 0.9 % 100 mL IVPB (0 g Intravenous Stopped 11/11/22 2112)  lactated ringers  bolus 1,000 mL (0 mLs Intravenous Stopped 11/11/22 2010)     IMPRESSION / MDM /  ASSESSMENT AND PLAN / ED COURSE  I reviewed the triage vital signs and the nursing notes.                              Patient's presentation is most consistent with acute presentation with potential threat to life or bodily function.  Differential diagnosis includes, but is not limited to, sepsis secondary to UTI, pneumonia, influenza or COVID, bacteremia, meningitis/encephalitis  The patient is a 81 year old male with underlying dementia who presents because of fever and altered mental status.  Unable to obtain history from him he is oriented to person only but does follow some simple commands.  Apparently he got a flu shot several days ago and has had fever and wife had fever as well.  He is febrile to 102.7 tachypneic satting 91% on room air.  He is oriented to person neck is supple abdominal exam overall benign no obvious signs of skin or soft tissue infection.  Plan to obtain sepsis workup will obtain CT head given change in mental status.  Apparently became hypotensive after he stood up with EMS currently his blood pressure is holding we will order a bolus of fluid and Tylenol.  Patient will need admission.  Patient's family now at bedside tells me that he started to have some confusion today and had a fever to 102.  They gave him some Tylenol put him to sleep and try to push Pedialyte.  When they then tried to get him out of bed he was too weak to get up and was more confused.  At his baseline he has some dementia but is typically alert and oriented can ambulate and feed himself during the day but tends to sundown during the evening.   Patient's UA is positive.  Given febrile UTI with his degree of illness I did obtain a CT renal study to rule out obstruction.  He has an AKI so did not get contrast.  CT does not show any obstruction.  Patient's map is now just about 27.  He is on his second liter of fluid.   Received about 500 cc with EMS so has gotten a total of about 2 L.  30 cc/kg be about 2400 cc for him.  If persistently hypotensive after fluids will need pressors.  BP has responded appropriately, MAP >65.   FINAL CLINICAL IMPRESSION(S) / ED DIAGNOSES   Final diagnoses:  Sepsis, due to unspecified organism, unspecified whether acute organ dysfunction present [facility])  Urinary tract infection without hematuria, site unspecified     Rx / DC Orders   ED Discharge Orders     None        Note:  This document was prepared using Dragon voice recognition software and may include unintentional dictation errors.   Rada Hay, MD 11/11/22 2014    Rada Hay, MD 11/11/22 2113

## 2022-11-12 DIAGNOSIS — G9340 Encephalopathy, unspecified: Secondary | ICD-10-CM

## 2022-11-12 DIAGNOSIS — N390 Urinary tract infection, site not specified: Secondary | ICD-10-CM

## 2022-11-12 LAB — CBC
HCT: 28.9 % — ABNORMAL LOW (ref 39.0–52.0)
Hemoglobin: 9.2 g/dL — ABNORMAL LOW (ref 13.0–17.0)
MCH: 27.9 pg (ref 26.0–34.0)
MCHC: 31.8 g/dL (ref 30.0–36.0)
MCV: 87.6 fL (ref 80.0–100.0)
Platelets: 62 10*3/uL — ABNORMAL LOW (ref 150–400)
RBC: 3.3 MIL/uL — ABNORMAL LOW (ref 4.22–5.81)
RDW: 13.2 % (ref 11.5–15.5)
WBC: 5.8 10*3/uL (ref 4.0–10.5)
nRBC: 0 % (ref 0.0–0.2)

## 2022-11-12 LAB — BASIC METABOLIC PANEL
Anion gap: 6 (ref 5–15)
BUN: 33 mg/dL — ABNORMAL HIGH (ref 8–23)
CO2: 22 mmol/L (ref 22–32)
Calcium: 7.9 mg/dL — ABNORMAL LOW (ref 8.9–10.3)
Chloride: 108 mmol/L (ref 98–111)
Creatinine, Ser: 1.9 mg/dL — ABNORMAL HIGH (ref 0.61–1.24)
GFR, Estimated: 35 mL/min — ABNORMAL LOW (ref >60)
Glucose, Bld: 133 mg/dL — ABNORMAL HIGH (ref 70–99)
Potassium: 4.5 mmol/L (ref 3.5–5.1)
Sodium: 136 mmol/L (ref 135–145)

## 2022-11-12 LAB — CBG MONITORING, ED
Glucose-Capillary: 104 mg/dL — ABNORMAL HIGH (ref 70–99)
Glucose-Capillary: 114 mg/dL — ABNORMAL HIGH (ref 70–99)
Glucose-Capillary: 115 mg/dL — ABNORMAL HIGH (ref 70–99)
Glucose-Capillary: 121 mg/dL — ABNORMAL HIGH (ref 70–99)
Glucose-Capillary: 123 mg/dL — ABNORMAL HIGH (ref 70–99)
Glucose-Capillary: 91 mg/dL (ref 70–99)
Glucose-Capillary: 97 mg/dL (ref 70–99)

## 2022-11-12 LAB — CORTISOL-AM, BLOOD: Cortisol - AM: 26 ug/dL — ABNORMAL HIGH (ref 6.7–22.6)

## 2022-11-12 LAB — PROCALCITONIN: Procalcitonin: 1.48 ng/mL

## 2022-11-12 LAB — HEMOGLOBIN A1C
Hgb A1c MFr Bld: 6.1 % — ABNORMAL HIGH (ref 4.8–5.6)
Mean Plasma Glucose: 128 mg/dL

## 2022-11-12 NOTE — ED Notes (Signed)
Bladder scanned patient and volume was 152m.

## 2022-11-12 NOTE — ED Notes (Signed)
Small BP was applied

## 2022-11-12 NOTE — ED Notes (Signed)
Report given to Su Grand of ED at this time. No questions, comments, or concerns after report was given.

## 2022-11-12 NOTE — Progress Notes (Signed)
PROGRESS NOTE  Daniel Sherman    DOB: 04-22-1941, 81 y.o.  BOF:[id number]    Code Status: Full Code   DOA: 11/11/2022   LOS: 1   Brief hospital course  Daniel Sherman is a 81 y.o. male with a PMH significant for type 2 diabetes mellitus, hypertension, Colon cancer status post right hemicolectomy, primary pancreatic neuroendocrine tumor, CAD, chronic thrombocytopenia and dementia.  They presented from home to the ED on 11/11/2022 with lethargy and increased confusion from baseline dementia x 1 days. Does have sundowning at baseline but performs ADLS independently.   In the ED, it was found that they had  Tmax 102.7 with BP 113/52, pulse 93 and O2 sat 91% on room air.  Significant findings included normal WBC but lactic acid 3.1.  Hemoglobin 10.1, down from baseline of 12.4 about 8 months prior and platelets 74,000 down from baseline around 80-100,000.  Urinalysis strongly consistent with UTI.  CMP significant for creatinine of 2.17, up from baseline of 1.5 with a bicarb of 19 and normal anion gap COVID and flu negative. UxCx and BxCx were collected.  EKG shows sinus at 86 with no acute ST-T wave changes. Imaging: CT head was nonacute but showing generalized cerebral atrophy and chronic microvascular ischemic changes.  Chest x-ray nonacute.  CT abdomen and pelvis showing persistent pancreatic tail mass consistent with known history of neuroendocrine tumor. (Please see report for further details).  They were initially treated with IV fluids, rocephin.   Patient was admitted to medicine service for further workup and management of urosepsis as outlined in detail below.  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)   Primary thrombocytopenia (HCC)   Diabetes mellitus without complication ([place])   Essential  hypertension   Colon cancer s/p right colectomy ([place])   Primary pancreatic neuroendocrine tumor   Late onset Alzheimer's disease without behavioral disturbance (HCC)   Coronary atherosclerosis of native coronary artery  Severe sepsis (HCC) Urinary tract infection Acute metabolic encephalopathy Severe sepsis criteria includes fever, tachycardia, hypotension lactic acidosis, AKI and acute encephalopathy Continue sepsis fluids Continue Rocephin Follow cultures - PT/OT - delirium precautions   Acute renal failure superimposed on stage 3aCKD- improving Creatinine 2.17>1.9. baseline of 1.5, secondary to sepsis Avoid nephrotoxins Hold home metformin and lisinopril and HCTZ   Primary thrombocytopenia ([place])- 62 down from baseline of 100,000. No signs of active bleeding SCD for DVT prophylaxis until it recovers   Coronary atherosclerosis of native coronary artery Continue simvastatin   Late onset Alzheimer's disease without behavioral disturbance (HCC) Continue donepezil, memantine and escitalopram Delirium precautions Haldol as needed delirium   Primary pancreatic neuroendocrine tumor No acute issues suspected   Colon cancer s/p right colectomy (HCC) No acute issues suspected   Essential hypertension Holding home antihypertensives of HCTZ, lisinopril due to hypotension related to sepsis.  Can resume as sepsis physiology resolve   Diabetes mellitus without complication (HCC) Sliding scale insulin coverage.  Hold home oral hypoglycemics for now  There is no height or weight on file to calculate BMI.  VTE ppx: SCDs Start: 11/11/22 2139   Diet:     Diet   Diet NPO time specified Except for: Sips with Meds   Consultants: None   Subjective 11/12/22    Pt reports no complaints. He is pleasantly confused. Daughter  in law at bedside states that he is more confused than his baseline.   Objective   Vitals:   11/12/22 0357 11/12/22 0526 11/12/22 0714 11/12/22 0730  BP: (!)  99/47 100/61 111/62 115/63  Pulse: 94 95 81 82  Resp: '20 20 20 20  '$ Temp: 99.1 F (37.3 C)   98.2 F (36.8 C)  TempSrc: Oral   Oral  SpO2: 97% 98% 100% 100%    Intake/Output Summary (Last 24 hours) at 11/12/2022 2458 Last data filed at 11/12/2022 0214 Gross per 24 hour  Intake 2700 ml  Output --  Net 2700 ml   There were no vitals filed for this visit.   Physical Exam:  General: awake, alert, NAD HEENT: atraumatic, clear conjunctiva, anicteric sclera, MMM, hearing grossly normal Respiratory: normal respiratory effort. Cardiovascular: quick capillary refill, normal S1/S2, RRR, no JVD, murmurs Gastrointestinal: soft, NT, ND Nervous: A&O x1 to self. Essential tremor. Able to follow commands and move all limbs equally.  Extremities: no edema, normal tone Skin: dry, intact, normal temperature, normal color. No rashes, lesions or ulcers on exposed skin Psychiatry: normal mood, congruent affect  Labs   I have personally reviewed the following labs and imaging studies CBC    Component Value Date/Time   WBC 5.8 11/12/2022 0525   RBC 3.30 (L) 11/12/2022 0525   HGB 9.2 (L) 11/12/2022 0525   HCT 28.9 (L) 11/12/2022 0525   PLT 62 (L) 11/12/2022 0525   MCV 87.6 11/12/2022 0525   MCH 27.9 11/12/2022 0525   MCHC 31.8 11/12/2022 0525   RDW 13.2 11/12/2022 0525   LYMPHSABS 0.4 (L) 11/11/2022 1829   MONOABS 0.6 11/11/2022 1829   EOSABS 0.0 11/11/2022 1829   BASOSABS 0.0 11/11/2022 1829      Latest Ref Rng & Units 11/12/2022    5:25 AM 11/11/2022    6:29 PM 03/12/2022   10:54 AM  BMP  Glucose 70 - 99 mg/dL 133  160  77   BUN 8 - 23 mg/dL 33  35  23   Creatinine 0.61 - 1.24 mg/dL 1.90  2.17  1.50   Sodium 135 - 145 mmol/L 136  134  136   Potassium 3.5 - 5.1 mmol/L 4.5  4.5  5.0   Chloride 98 - 111 mmol/L 108  104  103   CO2 22 - 32 mmol/L '22  19  27   '$ Calcium 8.9 - 10.3 mg/dL 7.9  8.3  8.2     CT Renal Stone Study  Result Date: 11/11/2022 CLINICAL DATA:  Fever and flank  pain, initial encounter EXAM: CT ABDOMEN AND PELVIS WITHOUT CONTRAST TECHNIQUE: Multidetector CT imaging of the abdomen and pelvis was performed following the standard protocol without IV contrast. RADIATION DOSE REDUCTION: This exam was performed according to the departmental dose-optimization program which includes automated exposure control, adjustment of the mA and/or kV according to patient size and/or use of iterative reconstruction technique. COMPARISON:  09/19/2021 PET-CT FINDINGS: Lower chest: Lung bases demonstrate some mild dependent atelectatic changes. No focal confluent infiltrate is seen. Hepatobiliary: No focal liver abnormality is seen. Status post cholecystectomy. No biliary dilatation. Pancreas: The head and body of the pancreas are within normal limits. In the pancreatic tail there is a 3.2 by a 3.3 cm mass lesion which is slightly larger than that seen on the prior PET-CT at which time it measured 3.4 x 2.4 cm. This is consistent with the patient's known history of pancreatic neuroendocrine tumor. Spleen: Scattered calcified granulomas are  noted. Adrenals/Urinary Tract: Adrenal glands are within normal limits. Kidneys are well visualized bilaterally. No renal calculi or obstructive changes are seen. A simple appearing cyst is noted in the upper pole of the right kidney measuring 3.7 cm relatively stable from the prior exam. No further follow-up is recommended. The ureters are within normal limits. The bladder is decompressed. Stomach/Bowel: Colon demonstrates mild diverticular change without evidence of diverticulitis. Fluid is noted throughout the colon consistent with a diarrheal state. Postsurgical changes are noted in the proximal colon. The appendix is not visualized consistent with the postoperative change. No anastomotic narrowing is seen. There is again noted and encapsulated area of fat necrosis in the right abdomen stable from previous exams. Small bowel is within normal limits.  Stomach is unremarkable. Vascular/Lymphatic: Aortic atherosclerosis. No enlarged abdominal or pelvic lymph nodes. Reproductive: Prostate is unremarkable. Other: No abdominal wall hernia or abnormality. No abdominopelvic ascites. Musculoskeletal: Postsurgical changes are noted in the left hip. A stable non healed fracture is noted along the posteromedial aspect of the left acetabulum similar to that seen 2022. Degenerative changes of lumbar spine are noted. No acute bony abnormality is seen. IMPRESSION: Persistent pancreatic tail mass consistent with the given clinical history of neuroendocrine tumor. The mass now measures 3.2 x 3.3 cm slightly larger than that seen on the prior PET-CT examination. Mild diverticular change without diverticulitis. Diffuse colonic fluid consistent with the diarrheal state in the current history of incontinence. Stable appearing undisplaced fracture involving the posteromedial aspect of the left acetabulum. Electronically Signed   By: Inez Catalina M.D.   On: 11/11/2022 20:17   CT HEAD WO CONTRAST (5MM)  Result Date: 11/11/2022 CLINICAL DATA:  Mental status change. EXAM: CT HEAD WITHOUT CONTRAST TECHNIQUE: Contiguous axial images were obtained from the base of the skull through the vertex without intravenous contrast. RADIATION DOSE REDUCTION: This exam was performed according to the departmental dose-optimization program which includes automated exposure control, adjustment of the mA and/or kV according to patient size and/or use of iterative reconstruction technique. COMPARISON:  MRI examination dated January 22, 2022; CT head dated December 09, 2016 FINDINGS: Brain: No evidence of acute infarction, hemorrhage, hydrocephalus, extra-axial collection or mass lesion/mass effect. Moderate generalized cerebral atrophy. Diffuse low-attenuation of the periventricular and subcortical white matter presumed chronic microvascular ischemic changes. Vascular: No hyperdense vessel or unexpected  calcification. Skull: Normal. Negative for fracture or focal lesion. Sinuses/Orbits: No acute finding. Other: None. IMPRESSION: 1. No acute intracranial abnormality. 2. Moderate generalized cerebral atrophy and chronic microvascular ischemic changes of the white matter. Electronically Signed   By: Keane Police D.O.   On: 11/11/2022 19:30   DG Chest Port 1 View  Result Date: 11/11/2022 CLINICAL DATA:  Sepsis, fever, weakness EXAM: PORTABLE CHEST 1 VIEW COMPARISON:  12/09/2016 FINDINGS: Single frontal view of the chest demonstrates an unremarkable cardiac silhouette. Lung volumes are diminished, with no acute airspace disease, effusion, or pneumothorax. No acute bony abnormalities. IMPRESSION: 1. Low lung volumes.  No acute process. Electronically Signed   By: Randa Ngo M.D.   On: 11/11/2022 18:53    Disposition Plan & Communication  Patient status: Inpatient  Admitted From: Home Planned disposition location: Home Anticipated discharge date: 12/13 pending clinical improvement  Family Communication: daughter in law at bedside    Author: Richarda Osmond, DO [facility] 11/12/2022, 8:06 AM   Available by Epic secure chat 7AM-7PM. If 7PM-7AM, please contact night-coverage.  [facility] contact information found on [email or web address].

## 2022-11-13 ENCOUNTER — Encounter: Payer: Self-pay | Admitting: Student in an Organized Health Care Education/Training Program

## 2022-11-13 DIAGNOSIS — I10 Essential (primary) hypertension: Secondary | ICD-10-CM

## 2022-11-13 DIAGNOSIS — A419 Sepsis, unspecified organism: Secondary | ICD-10-CM | POA: Diagnosis present

## 2022-11-13 DIAGNOSIS — F0390 Unspecified dementia without behavioral disturbance: Secondary | ICD-10-CM

## 2022-11-13 LAB — COMPREHENSIVE METABOLIC PANEL
ALT: 7 U/L (ref 0–44)
AST: 16 U/L (ref 15–41)
Albumin: 2.6 g/dL — ABNORMAL LOW (ref 3.5–5.0)
Alkaline Phosphatase: 45 U/L (ref 38–126)
Anion gap: 6 (ref 5–15)
BUN: 28 mg/dL — ABNORMAL HIGH (ref 8–23)
CO2: 22 mmol/L (ref 22–32)
Calcium: 7.6 mg/dL — ABNORMAL LOW (ref 8.9–10.3)
Chloride: 109 mmol/L (ref 98–111)
Creatinine, Ser: 1.65 mg/dL — ABNORMAL HIGH (ref 0.61–1.24)
GFR, Estimated: 41 mL/min — ABNORMAL LOW (ref >60)
Glucose, Bld: 115 mg/dL — ABNORMAL HIGH (ref 70–99)
Potassium: 4.3 mmol/L (ref 3.5–5.1)
Sodium: 137 mmol/L (ref 135–145)
Total Bilirubin: 1 mg/dL (ref 0.3–1.2)
Total Protein: 5.5 g/dL — ABNORMAL LOW (ref 6.5–8.1)

## 2022-11-13 LAB — CBC
HCT: 27.4 % — ABNORMAL LOW (ref 39.0–52.0)
Hemoglobin: 8.8 g/dL — ABNORMAL LOW (ref 13.0–17.0)
MCH: 28.1 pg (ref 26.0–34.0)
MCHC: 32.1 g/dL (ref 30.0–36.0)
MCV: 87.5 fL (ref 80.0–100.0)
Platelets: 66 10*3/uL — ABNORMAL LOW (ref 150–400)
RBC: 3.13 MIL/uL — ABNORMAL LOW (ref 4.22–5.81)
RDW: 13.1 % (ref 11.5–15.5)
WBC: 4.1 10*3/uL (ref 4.0–10.5)
nRBC: 0 % (ref 0.0–0.2)

## 2022-11-13 LAB — URINE CULTURE: Culture: >=100000 — AB

## 2022-11-13 LAB — CBG MONITORING, ED
Glucose-Capillary: 98 mg/dL (ref 70–99)
Glucose-Capillary: 102 mg/dL — ABNORMAL HIGH (ref 70–99)

## 2022-11-13 LAB — GLUCOSE, CAPILLARY
Glucose-Capillary: 121 mg/dL — ABNORMAL HIGH (ref 70–99)
Glucose-Capillary: 113 mg/dL — ABNORMAL HIGH (ref 70–99)

## 2022-11-13 MED ORDER — SODIUM CHLORIDE 0.9 % IV SOLN
1.0000 g | Freq: Four times a day (QID) | INTRAVENOUS | Status: DC
  Administered 2022-11-13 – 2022-11-14 (×3): 1 g via INTRAVENOUS
  Filled 2022-11-13: qty 1
  Filled 2022-11-13 (×2): qty 1000
  Filled 2022-11-13 (×2): qty 1

## 2022-11-13 MED ORDER — ESCITALOPRAM OXALATE 10 MG PO TABS
5.0000 mg | ORAL_TABLET | Freq: Two times a day (BID) | ORAL | Status: DC
  Administered 2022-11-13 – 2022-11-14 (×2): 5 mg via ORAL
  Filled 2022-11-13 (×2): qty 1

## 2022-11-13 MED ORDER — MEMANTINE HCL 5 MG PO TABS
10.0000 mg | ORAL_TABLET | Freq: Two times a day (BID) | ORAL | Status: DC
  Administered 2022-11-13 – 2022-11-14 (×2): 10 mg via ORAL
  Filled 2022-11-13 (×2): qty 2

## 2022-11-13 NOTE — ED Notes (Signed)
Pt upstairs in clean brief and with al belongings.

## 2022-11-13 NOTE — Progress Notes (Addendum)
PROGRESS NOTE  Daniel Sherman    DOB: Jul 04, 1941, 81 y.o.  RSW:[id number]    Code Status: Full Code   DOA: 11/11/2022   LOS: 2   Brief hospital course  Daniel Sherman is a 81 y.o. male with a PMH significant for type 2 diabetes mellitus, hypertension, Colon cancer status post right hemicolectomy, primary pancreatic neuroendocrine tumor, CAD, chronic thrombocytopenia and dementia.  They presented from home to the ED on 11/11/2022 with lethargy and increased confusion from baseline dementia x 1 days. Does have sundowning at baseline but performs ADLS independently.   In the ED, it was found that they had  Tmax 102.7 with BP 113/52, pulse 93 and O2 sat 91% on room air.  Significant findings included normal WBC but lactic acid 3.1.  Hemoglobin 10.1, down from baseline of 12.4 about 8 months prior and platelets 74,000 down from baseline around 80-100,000.  Urinalysis strongly consistent with UTI.  CMP significant for creatinine of 2.17, up from baseline of 1.5 with a bicarb of 19 and normal anion gap COVID and flu negative. UxCx and BxCx were collected.  EKG shows sinus at 86 with no acute ST-T wave changes. Imaging: CT head was nonacute but showing generalized cerebral atrophy and chronic microvascular ischemic changes.  Chest x-ray nonacute.  CT abdomen and pelvis showing persistent pancreatic tail mass consistent with known history of neuroendocrine tumor. (Please see report for further details).  They were initially treated with IV fluids, rocephin.   12/11- remained altered and weak but hemodynamically stable.  11/13/22 -stable, oriented only to self but able to follow commands and lift both legs off bed which he was not able to day prior. Son at bedside says he is close to his baseline.   Assessment & Plan  Principal Problem:   Severe sepsis ([place]) Active Problems:   Urinary tract infection   Acute metabolic encephalopathy   Acute renal failure superimposed on stage 3a chronic kidney  disease (HCC)   Primary thrombocytopenia (HCC)   Diabetes mellitus without complication ([place])   Essential hypertension   Colon cancer s/p right colectomy ([place])   Primary pancreatic neuroendocrine tumor   Late onset Alzheimer's disease without behavioral disturbance (HCC)   Coronary atherosclerosis of native coronary artery   Acute encephalopathy  Severe sepsis- BxCx NGTD Urinary tract infection- urine culture growing enterococcus faecalis sensitive to ampicillin. Appears to have good UOP although not being recorded well Acute metabolic encephalopathy- improving Severe sepsis criteria includes fever, tachycardia, hypotension lactic acidosis, AKI and acute encephalopathy. Sepsis criteria have resolved. Taking good PO intae - dc IV fluids and encourage PO fluids - change Abx to ampicillin - continue to follow BxCx - PT/OT - delirium precautions - strict I/O   Acute renal failure superimposed on stage 3aCKD- improving Creatinine 2.17>1.9>1.65. baseline of 1.5 - Avoid nephrotoxins - Hold home metformin and lisinopril and HCTZ  Essential hypertension- normotensive to borderline low Bps so will continue to hold home antihypertensives.  Holding home antihypertensives of HCTZ, lisinopril due to hypotension related to sepsis.  Can resume as sepsis physiology resolve  Diabetes mellitus without complication ([place])- K9F on admission was 6.1 which is well below goal of 8 for age. Blood sugars have been well controlled while inpatient. Not on home insuline - discontinue sliding scale and monitor BS PRN and on daily labs   Primary thrombocytopenia ([place])- 62 down from baseline of 100,000. No signs of active bleeding SCD for DVT prophylaxis until it recovers   Coronary atherosclerosis  of native coronary artery - Continue simvastatin   Late onset Alzheimer's disease without behavioral disturbance (HCC) - Continue donepezil, memantine and escitalopram - Delirium precautions - seroquel PRN    Primary pancreatic neuroendocrine tumor No acute issues suspected   Colon cancer s/p right colectomy (HCC) No acute issues suspected    There is no height or weight on file to calculate BMI.  VTE ppx: SCDs Start: 11/11/22 2139  Diet:     Diet   Diet regular Room service appropriate? Yes; Fluid consistency: Thin   Consultants: None   Subjective 11/13/22    Pt reports no complaints. He is pleasantly confused. Son at bedside states he is close to his baseline. Agreeable to SNF if recommended.   Objective   Vitals:   11/13/22 0600 11/13/22 0630 11/13/22 0646 11/13/22 0700  BP: 117/60 (!) 108/51  115/63  Pulse: 70 73  71  Resp: (!) 25 (!) 23  (!) 21  Temp:   98.5 F (36.9 C)   TempSrc:   Oral   SpO2: 95% 94%  94%    Intake/Output Summary (Last 24 hours) at 11/13/2022 0829 Last data filed at 11/13/2022 0646 Gross per 24 hour  Intake 4123.15 ml  Output 450 ml  Net 3673.15 ml    There were no vitals filed for this visit.   Physical Exam:  General: awake, alert, NAD HEENT: atraumatic, clear conjunctiva, anicteric sclera, MMM, hearing grossly normal Respiratory: normal respiratory effort. Cardiovascular: quick capillary refill, normal S1/S2, RRR, no JVD, murmurs Gastrointestinal: soft, NT, ND Nervous: A&O x1 to self. Tremor has resolved. Able to follow commands and move all limbs equally.  Extremities: no edema, normal tone Skin: dry, intact, normal temperature, normal color. No rashes, lesions or ulcers on exposed skin Psychiatry: normal mood, congruent affect  Labs   I have personally reviewed the following labs and imaging studies CBC    Component Value Date/Time   WBC 4.1 11/13/2022 0535   RBC 3.13 (L) 11/13/2022 0535   HGB 8.8 (L) 11/13/2022 0535   HCT 27.4 (L) 11/13/2022 0535   PLT 66 (L) 11/13/2022 0535   MCV 87.5 11/13/2022 0535   MCH 28.1 11/13/2022 0535   MCHC 32.1 11/13/2022 0535   RDW 13.1 11/13/2022 0535   LYMPHSABS 0.4 (L) 11/11/2022 1829    MONOABS 0.6 11/11/2022 1829   EOSABS 0.0 11/11/2022 1829   BASOSABS 0.0 11/11/2022 1829      Latest Ref Rng & Units 11/13/2022    5:35 AM 11/12/2022    5:25 AM 11/11/2022    6:29 PM  BMP  Glucose 70 - 99 mg/dL 115  133  160   BUN 8 - 23 mg/dL 28  33  35   Creatinine 0.61 - 1.24 mg/dL 1.65  1.90  2.17   Sodium 135 - 145 mmol/L 137  136  134   Potassium 3.5 - 5.1 mmol/L 4.3  4.5  4.5   Chloride 98 - 111 mmol/L 109  108  104   CO2 22 - 32 mmol/L '22  22  19   '$ Calcium 8.9 - 10.3 mg/dL 7.6  7.9  8.3     CT Renal Stone Study  Result Date: 11/11/2022 CLINICAL DATA:  Fever and flank pain, initial encounter EXAM: CT ABDOMEN AND PELVIS WITHOUT CONTRAST TECHNIQUE: Multidetector CT imaging of the abdomen and pelvis was performed following the standard protocol without IV contrast. RADIATION DOSE REDUCTION: This exam was performed according to the departmental dose-optimization program which includes automated  exposure control, adjustment of the mA and/or kV according to patient size and/or use of iterative reconstruction technique. COMPARISON:  09/19/2021 PET-CT FINDINGS: Lower chest: Lung bases demonstrate some mild dependent atelectatic changes. No focal confluent infiltrate is seen. Hepatobiliary: No focal liver abnormality is seen. Status post cholecystectomy. No biliary dilatation. Pancreas: The head and body of the pancreas are within normal limits. In the pancreatic tail there is a 3.2 by a 3.3 cm mass lesion which is slightly larger than that seen on the prior PET-CT at which time it measured 3.4 x 2.4 cm. This is consistent with the patient's known history of pancreatic neuroendocrine tumor. Spleen: Scattered calcified granulomas are noted. Adrenals/Urinary Tract: Adrenal glands are within normal limits. Kidneys are well visualized bilaterally. No renal calculi or obstructive changes are seen. A simple appearing cyst is noted in the upper pole of the right kidney measuring 3.7 cm relatively  stable from the prior exam. No further follow-up is recommended. The ureters are within normal limits. The bladder is decompressed. Stomach/Bowel: Colon demonstrates mild diverticular change without evidence of diverticulitis. Fluid is noted throughout the colon consistent with a diarrheal state. Postsurgical changes are noted in the proximal colon. The appendix is not visualized consistent with the postoperative change. No anastomotic narrowing is seen. There is again noted and encapsulated area of fat necrosis in the right abdomen stable from previous exams. Small bowel is within normal limits. Stomach is unremarkable. Vascular/Lymphatic: Aortic atherosclerosis. No enlarged abdominal or pelvic lymph nodes. Reproductive: Prostate is unremarkable. Other: No abdominal wall hernia or abnormality. No abdominopelvic ascites. Musculoskeletal: Postsurgical changes are noted in the left hip. A stable non healed fracture is noted along the posteromedial aspect of the left acetabulum similar to that seen 2022. Degenerative changes of lumbar spine are noted. No acute bony abnormality is seen. IMPRESSION: Persistent pancreatic tail mass consistent with the given clinical history of neuroendocrine tumor. The mass now measures 3.2 x 3.3 cm slightly larger than that seen on the prior PET-CT examination. Mild diverticular change without diverticulitis. Diffuse colonic fluid consistent with the diarrheal state in the current history of incontinence. Stable appearing undisplaced fracture involving the posteromedial aspect of the left acetabulum. Electronically Signed   By: Inez Catalina M.D.   On: 11/11/2022 20:17   CT HEAD WO CONTRAST (5MM)  Result Date: 11/11/2022 CLINICAL DATA:  Mental status change. EXAM: CT HEAD WITHOUT CONTRAST TECHNIQUE: Contiguous axial images were obtained from the base of the skull through the vertex without intravenous contrast. RADIATION DOSE REDUCTION: This exam was performed according to the  departmental dose-optimization program which includes automated exposure control, adjustment of the mA and/or kV according to patient size and/or use of iterative reconstruction technique. COMPARISON:  MRI examination dated January 22, 2022; CT head dated December 09, 2016 FINDINGS: Brain: No evidence of acute infarction, hemorrhage, hydrocephalus, extra-axial collection or mass lesion/mass effect. Moderate generalized cerebral atrophy. Diffuse low-attenuation of the periventricular and subcortical white matter presumed chronic microvascular ischemic changes. Vascular: No hyperdense vessel or unexpected calcification. Skull: Normal. Negative for fracture or focal lesion. Sinuses/Orbits: No acute finding. Other: None. IMPRESSION: 1. No acute intracranial abnormality. 2. Moderate generalized cerebral atrophy and chronic microvascular ischemic changes of the white matter. Electronically Signed   By: Keane Police D.O.   On: 11/11/2022 19:30   DG Chest Port 1 View  Result Date: 11/11/2022 CLINICAL DATA:  Sepsis, fever, weakness EXAM: PORTABLE CHEST 1 VIEW COMPARISON:  12/09/2016 FINDINGS: Single frontal view of the chest demonstrates  an unremarkable cardiac silhouette. Lung volumes are diminished, with no acute airspace disease, effusion, or pneumothorax. No acute bony abnormalities. IMPRESSION: 1. Low lung volumes.  No acute process. Electronically Signed   By: Randa Ngo M.D.   On: 11/11/2022 18:53    Disposition Plan & Communication  Patient status: Inpatient  Admitted From: Home Planned disposition location: Home Anticipated discharge date: 12/14 pending clinical improvement  Family Communication: son at bedside    Author: Richarda Osmond, DO [facility] 11/13/2022, 8:29 AM   Available by Epic secure chat 7AM-7PM. If 7PM-7AM, please contact night-coverage.  [facility] contact information found on [email or web address].

## 2022-11-14 DIAGNOSIS — D61818 Other pancytopenia: Secondary | ICD-10-CM

## 2022-11-14 DIAGNOSIS — N3000 Acute cystitis without hematuria: Secondary | ICD-10-CM

## 2022-11-14 LAB — BASIC METABOLIC PANEL
Anion gap: 4 — ABNORMAL LOW (ref 5–15)
BUN: 24 mg/dL — ABNORMAL HIGH (ref 8–23)
CO2: 23 mmol/L (ref 22–32)
Calcium: 7.5 mg/dL — ABNORMAL LOW (ref 8.9–10.3)
Chloride: 110 mmol/L (ref 98–111)
Creatinine, Ser: 1.48 mg/dL — ABNORMAL HIGH (ref 0.61–1.24)
GFR, Estimated: 47 mL/min — ABNORMAL LOW (ref >60)
Glucose, Bld: 125 mg/dL — ABNORMAL HIGH (ref 70–99)
Potassium: 3.9 mmol/L (ref 3.5–5.1)
Sodium: 137 mmol/L (ref 135–145)

## 2022-11-14 LAB — IRON AND TIBC
Iron: 18 ug/dL — ABNORMAL LOW (ref 45–182)
Saturation Ratios: 11 % — ABNORMAL LOW (ref 17.9–39.5)
TIBC: 172 ug/dL — ABNORMAL LOW (ref 250–450)
UIBC: 154 ug/dL

## 2022-11-14 LAB — CBC
HCT: 26.2 % — ABNORMAL LOW (ref 39.0–52.0)
Hemoglobin: 8.5 g/dL — ABNORMAL LOW (ref 13.0–17.0)
MCH: 27.7 pg (ref 26.0–34.0)
MCHC: 32.4 g/dL (ref 30.0–36.0)
MCV: 85.3 fL (ref 80.0–100.0)
Platelets: 66 10*3/uL — ABNORMAL LOW (ref 150–400)
RBC: 3.07 MIL/uL — ABNORMAL LOW (ref 4.22–5.81)
RDW: 13 % (ref 11.5–15.5)
WBC: 3.3 10*3/uL — ABNORMAL LOW (ref 4.0–10.5)
nRBC: 0 % (ref 0.0–0.2)

## 2022-11-14 LAB — VITAMIN B12: Vitamin B-12: 321 pg/mL (ref 180–914)

## 2022-11-14 LAB — FERRITIN: Ferritin: 301 ng/mL (ref 24–336)

## 2022-11-14 MED ORDER — CYANOCOBALAMIN 1000 MCG/ML IJ SOLN
1000.0000 ug | Freq: Once | INTRAMUSCULAR | Status: AC
  Administered 2022-11-14: 1000 ug via INTRAMUSCULAR
  Filled 2022-11-14: qty 1

## 2022-11-14 MED ORDER — POLYSACCHARIDE IRON COMPLEX 150 MG PO CAPS: 150.0000 mg | ORAL_CAPSULE | Freq: Every day | ORAL | 0 refills | Status: DC

## 2022-11-14 MED ORDER — POLYSACCHARIDE IRON COMPLEX 150 MG PO CAPS
150.0000 mg | ORAL_CAPSULE | Freq: Every day | ORAL | Status: DC
  Administered 2022-11-14: 150 mg via ORAL
  Filled 2022-11-14: qty 1

## 2022-11-14 MED ORDER — AMOXICILLIN 500 MG PO CAPS: 500.0000 mg | ORAL_CAPSULE | Freq: Three times a day (TID) | ORAL | 0 refills | Status: AC

## 2022-11-14 MED ORDER — AMOXICILLIN 500 MG PO CAPS: 500.0000 mg | ORAL_CAPSULE | Freq: Three times a day (TID) | ORAL | 0 refills | Status: DC

## 2022-11-14 NOTE — TOC Initial Note (Signed)
Transition of Care [facility]) - Initial/Assessment Note    Patient Details  Name: Daniel Sherman MRN: [id number] Date of Birth: 1941/04/14  Transition of Care [facility] CM/SW Contact:    Colen Darling, [place] Phone Number: 11/14/2022, 12:53 PM  Clinical Narrative:                  TOC spoke to the patient and his PCP is Dr. [name]. His spouse drives him. He had HHPT two years ago and uses [facility] on [address].       Patient Goals and CMS Choice  Pending      Expected Discharge Plan and Services          Pending                                      Prior Living Arrangements/Services                       Activities of Daily Living Home Assistive Devices/Equipment: None ADL Screening (condition at time of admission) Patient's cognitive ability adequate to safely complete daily activities?: Yes Is the patient deaf or have difficulty hearing?: Yes Does the patient have difficulty seeing, even when wearing glasses/contacts?: No Does the patient have difficulty concentrating, remembering, or making decisions?: Yes Patient able to express need for assistance with ADLs?: Yes Does the patient have difficulty dressing or bathing?: No Independently performs ADLs?: Yes (appropriate for developmental age) Does the patient have difficulty walking or climbing stairs?: No Weakness of Legs: Both Weakness of Arms/Hands: None  Permission Sought/Granted                  Emotional Assessment              Admission diagnosis:  Sepsis ([place]) [A41.9] Urinary tract infection without hematuria, site unspecified [A63.0] Acute metabolic encephalopathy [Z60.10] Sepsis, due to unspecified organism, unspecified whether acute organ dysfunction present [facility]) [A41.9] Patient Active Problem List   Diagnosis Date Noted   Pancytopenia ([place]) 11/14/2022   Sepsis ([place]) 11/13/2022   Dementia without behavioral disturbance ([place]) 11/13/2022   Primary  hypertension 11/13/2022   Acute encephalopathy 93/23/5573   Acute metabolic encephalopathy 22/01/5426   Acute renal failure superimposed on stage 3a chronic kidney disease ([place]) 11/11/2022   Urinary tract infection 11/11/2022   Coronary atherosclerosis of native coronary artery 11/11/2022   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   Late onset Alzheimer's disease without behavioral disturbance ([place]) 01/13/2022   Gastroenteritis 06/17/2021   Abdominal pain    Nausea    Calculus of gallbladder with acute cholecystitis without obstruction    Goals of care, counseling/discussion 04/03/2020   IDA (iron deficiency anemia) 02/22/2020   Colon cancer s/p right colectomy ([place]) 08/12/2019   Hepatitis    Severe sepsis ([place])    SIRS (systemic inflammatory response syndrome) ([place]) 12/09/2016   Abnormal LFTs 12/09/2016   Acute viral syndrome 12/09/2016   S/P left THA, 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 [address] Phone: [phone] Fax: [phone]  [facility] [phone] -  [place], [place] - [address] [place] [address] Phone: [phone] Fax: [phone]     Social Determinants of Health (SDOH) Interventions    Readmission Risk Interventions    11/14/2022   12:51 PM  Readmission Risk Prevention Plan  Transportation Screening Complete  PCP or Specialist Appt within 3-5 Days Complete  HRI or [place] Complete  Social Work Consult for [place] Planning/Counseling Complete  Palliative Care Screening Not Applicable  Medication Review Press photographer) Referral to Pharmacy

## 2022-11-14 NOTE — Evaluation (Signed)
Physical Therapy Evaluation Patient Details Name: Daniel Sherman MRN: [id number] DOB: Feb 04, 1941 Today's Date: 11/14/2022  History of Present Illness  Daniel Sherman is a 81 y.o. male with medical history significant for type 2 diabetes mellitus, hypertension, Colon cancer status post right hemicolectomy, primary pancreatic neuroendocrine tumor, CAD, chronic thrombocytopenia and dementia who was brought to the ED for evaluation of a one day history of decreased oral intake, chills, lethargy and increased confusion from baseline.  Patient at baseline is fairly functional and independent except for sundowning however on the day of arrival he was noted to be more confused than his baseline and very lethargic and weak, unable to get out of bed.  History is limited due to dementia   Clinical Impression  Patient received in bed, spouse present in room. Patient requires multimodal cues to participate due to dementia. He is independent with bed mobility, transfers and required supervision only for ambulation in room. Patient is at baseline with mobility per wife. No concerns reported. Further skilled PT is not needed at this time.          Recommendations for follow up therapy are one component of a multi-disciplinary discharge planning process, led by the attending physician.  Recommendations may be updated based on patient status, additional functional criteria and insurance authorization.  Follow Up Recommendations No PT follow up      Assistance Recommended at Discharge Intermittent Supervision/Assistance  Patient can return home with the following  Help with stairs or ramp for entrance;Assist for transportation;Assistance with cooking/housework;Direct supervision/assist for medications management    Equipment Recommendations None recommended by PT  Recommendations for Other Services       Functional Status Assessment Patient has not had a recent decline in their functional status      Precautions / Restrictions Precautions Precaution Comments: low fall Restrictions Weight Bearing Restrictions: No      Mobility  Bed Mobility Overal bed mobility: Independent                  Transfers Overall transfer level: Independent                      Ambulation/Gait Ambulation/Gait assistance: Supervision Gait Distance (Feet): 30 Feet Assistive device: None Gait Pattern/deviations: Step-through pattern       General Gait Details: patient at baseline per spouse. No AD needed to ambulate in room. Supervision.  Stairs            Wheelchair Mobility    Modified Rankin (Stroke Patients Only)       Balance Overall balance assessment: Mild deficits observed, not formally tested                                           Pertinent Vitals/Pain Pain Assessment Pain Assessment: No/denies pain    Home Living Family/patient expects to be discharged to:: Private residence Living Arrangements: Spouse/significant other Available Help at Discharge: Family;Available 24 hours/day Type of Home: Mobile home Home Access: Ramped entrance       Home Layout: One level Home Equipment: [place] (2 wheels) Additional Comments: patient does not use AD at baseline. They have walker if needed    Prior Function Prior Level of Function : Independent/Modified Independent                     Hand Dominance  Dominant Hand: Right    Extremity/Trunk Assessment   Upper Extremity Assessment Upper Extremity Assessment: Overall WFL for tasks assessed    Lower Extremity Assessment Lower Extremity Assessment: Overall WFL for tasks assessed    Cervical / Trunk Assessment Cervical / Trunk Assessment: Normal  Communication   Communication: HOH  Cognition Arousal/Alertness: Awake/alert Behavior During Therapy: WFL for tasks assessed/performed Overall Cognitive Status: History of cognitive impairments - at baseline                                           General Comments      Exercises     Assessment/Plan    PT Assessment Patient does not need any further PT services  PT Problem List Decreased mobility;Decreased cognition       PT Treatment Interventions Gait training    PT Goals (Current goals can be found in the Care Plan section)  Acute Rehab PT Goals Patient Stated Goal: to return home PT Goal Formulation: With family Time For Goal Achievement: 11/15/22 Potential to Achieve Goals: Good    Frequency       Co-evaluation               AM-PAC PT "6 Clicks" Mobility  Outcome Measure Help needed turning from your back to your side while in a flat bed without using bedrails?: None Help needed moving from lying on your back to sitting on the side of a flat bed without using bedrails?: None Help needed moving to and from a bed to a chair (including a wheelchair)?: None Help needed standing up from a chair using your arms (e.g., wheelchair or bedside chair)?: None Help needed to walk in hospital room?: None Help needed climbing 3-5 steps with a railing? : A Little 6 Click Score: 23    End of Session   Activity Tolerance: Patient tolerated treatment well Patient left: in bed;with call bell/phone within reach;with bed alarm set Nurse Communication: Mobility status PT Visit Diagnosis: Muscle weakness (generalized) (M62.81)    Time: 8250-5397 PT Time Calculation (min) (ACUTE ONLY): 10 min   Charges:   PT Evaluation $PT Eval Low Complexity: 1 Low          Idelle Reimann, PT, GCS 11/14/22,1:26 PM

## 2022-11-14 NOTE — Discharge Summary (Signed)
Physician Discharge Summary   Patient: Daniel Sherman MRN: [id number] DOB: 1941/02/23  Admit date:     11/11/2022  Discharge date: 11/14/22  Discharge Physician: Sharen Hones   PCP: [name], MD   Recommendations at discharge:   Follow-up with PCP in 1 week. May resume B12 injection.  Received 1 dose injection hospital.  Discharge Diagnoses: Principal Problem:   Severe sepsis ([place]) Active Problems:   Urinary tract infection   Acute metabolic encephalopathy   Acute renal failure superimposed on stage 3a chronic kidney disease (HCC)   Primary thrombocytopenia (HCC)   Diabetes mellitus without complication ([place])   Essential hypertension   Colon cancer s/p right colectomy ([place])   Primary pancreatic neuroendocrine tumor   Late onset Alzheimer's disease without behavioral disturbance (HCC)   Coronary atherosclerosis of native coronary artery   Acute encephalopathy   Sepsis ([place])   Dementia without behavioral disturbance ([place])   Primary hypertension   Pancytopenia ([place])  Resolved Problems:   * No resolved hospital problems. *  Hospital Course: Daniel Sherman is a 81 y.o. male with a PMH significant for type 2 diabetes mellitus, hypertension, Colon cancer status post right hemicolectomy, primary pancreatic neuroendocrine tumor, CAD, chronic thrombocytopenia and dementia.   He presented with lethargy and increased confusion from baseline dementia x 1 days.  In the ED, it was found that they had  Tmax 102.7 with BP 113/52, pulse 93 and O2 sat 91% on room air.  Significant findings included normal WBC but lactic acid 3.1.  Hemoglobin 10.1, down from baseline of 12.4 about 8 months prior and platelets 74,000 down from baseline around 80-100,000.  Urinalysis strongly consistent with UTI.  CMP significant for creatinine of 2.17, up from baseline of 1.5 with a bicarb of 19 and normal anion gap COVID and flu negative. UxCx and BxCx were collected.  EKG shows sinus at 86 with no acute  ST-T wave changes. Imaging: CT head was nonacute but showing generalized cerebral atrophy and chronic microvascular ischemic changes.  Chest x-ray nonacute.  CT abdomen and pelvis showing persistent pancreatic tail mass consistent with known history of neuroendocrine tumor. (Please see report for further details).  Patient was diagnosed with sepsis and secondary to UTI.  Urine culture grow Enterococcus faecalis.  Patient was initially treated with Rocephin and changed to ampicillin. Condition had improved, seen by PT/OT, appears to be at baseline.  Medically stable to be discharged on oral antibiotics.  Assessment and Plan: * Severe sepsis ([place]) secondary to Enterococcus UTI. Urinary tract infection secondary to Enterococcus faecalis. Severe sepsis criteria includes fever, tachycardia, hypotension lactic acidosis, AKI and acute encephalopathy Patient urine culture grow Enterococcus faecalis, blood culture negative. Patient has been treated with ampicillin since yesterday, condition greatly improved.  Will change to amoxicillin upon discharge.  Acute metabolic encephalopathy Acute renal failure superimposed on stage 3a chronic kidney disease (HCC) Condition has been improved, renal function has improved.  Primary thrombocytopenia (HCC) Pancytopenia Iron deficient anemia. Patient has a chronic thrombocytopenia, he also has a mild leukopenia and significant anemia. He has iron deficiency, started oral iron treatment. Patient was also receiving B12 injection x1, I will recommend restarting B12 injections after leaving hospital. Patient currently does not have any rectal bleeding or black stool.  Pancytopenia appears to be secondary to prior cancer treatment.  Coronary atherosclerosis of native coronary artery Continue simvastatin  Late onset Alzheimer's disease without behavioral disturbance (HCC) Continue donepezil, memantine and escitalopram  Primary pancreatic neuroendocrine tumor No  acute  issues suspected  Colon cancer s/p right colectomy ([place]) No acute issues suspected  Essential hypertension Continue hold HCTZ.  Diabetes mellitus without complication (HCC) Sliding scale insulin coverage.  Hold home oral hypoglycemics for now        Consultants: None Procedures performed: None  Disposition: Home Diet recommendation:  Discharge Diet Orders (From admission, onward)     Start     Ordered   11/14/22 0000  Diet - low sodium heart healthy        11/14/22 1325           Cardiac diet DISCHARGE MEDICATION: Allergies as of 11/14/2022       Reactions   Other    BLOOD PRODUCT REFUSAL        Medication List     STOP taking these medications    hydrochlorothiazide 12.5 MG tablet Commonly known as: HYDRODIURIL       TAKE these medications    acetaminophen 650 MG CR tablet Commonly known as: TYLENOL Take 650 mg by mouth every 8 (eight) hours as needed for pain.   amoxicillin 500 MG capsule Commonly known as: AMOXIL Take 1 capsule (500 mg total) by mouth 3 (three) times daily for 8 days.   carboxymethylcellulose 0.5 % Soln Commonly known as: REFRESH PLUS Place 1 drop into both eyes 2 (two) times daily as needed (dry eyes).   colestipol 1 g tablet Commonly known as: COLESTID Take 1 g by mouth 2 (two) times daily.   donepezil 10 MG tablet Commonly known as: ARICEPT Take 10 mg by mouth daily.   escitalopram 5 MG tablet Commonly known as: LEXAPRO Take 5 mg by mouth daily.   gabapentin 300 MG capsule Commonly known as: NEURONTIN Take 300 mg by mouth 2 (two) times daily.   iron polysaccharides 150 MG capsule Commonly known as: NIFEREX Take 1 capsule (150 mg total) by mouth daily. Start taking on: November 15, 2022   linagliptin 5 MG Tabs tablet Commonly known as: TRADJENTA Take 5 mg by mouth daily.   lisinopril 40 MG tablet Commonly known as: ZESTRIL Take 40 mg by mouth every evening.   memantine 10 MG tablet Commonly  known as: NAMENDA Take 10 mg by mouth 2 (two) times daily.   metFORMIN 1000 MG tablet Commonly known as: GLUCOPHAGE Take 1,000 mg by mouth 2 (two) times daily.   omeprazole 40 MG capsule Commonly known as: PRILOSEC Take 40 mg by mouth daily.   simvastatin 40 MG tablet Commonly known as: ZOCOR Take 40 mg by mouth every evening.   VITAMIN B-12 IJ Inject 1,000 mcg as directed every 30 (thirty) days.        Follow-up Information     [name], MD Follow up in 1 week(s).   Specialty: Family Medicine Contact information: [place] [address] [phone]                Discharge Exam: There were no vitals filed for this visit. General exam: Appears calm and comfortable  Respiratory system: Clear to auscultation. Respiratory effort normal. Cardiovascular system: S1 & S2 heard, RRR. No JVD, murmurs, rubs, gallops or clicks. No pedal edema. Gastrointestinal system: Abdomen is nondistended, soft and nontender. No organomegaly or masses felt. Normal bowel sounds heard. Central nervous system: Alert and oriented x2. No focal neurological deficits. Extremities: Symmetric 5 x 5 power. Skin: No rashes, lesions or ulcers Psychiatry: Judgement and insight appear normal. Mood & affect appropriate. '  Condition at discharge:  good  The results of significant diagnostics from this hospitalization (including imaging, microbiology, ancillary and laboratory) are listed below for reference.   Imaging Studies: CT Renal Stone Study  Result Date: 11/11/2022 CLINICAL DATA:  Fever and flank pain, initial encounter EXAM: CT ABDOMEN AND PELVIS WITHOUT CONTRAST TECHNIQUE: Multidetector CT imaging of the abdomen and pelvis was performed following the standard protocol without IV contrast. RADIATION DOSE REDUCTION: This exam was performed according to the departmental dose-optimization program which includes automated exposure control, adjustment of the mA and/or kV  according to patient size and/or use of iterative reconstruction technique. COMPARISON:  09/19/2021 PET-CT FINDINGS: Lower chest: Lung bases demonstrate some mild dependent atelectatic changes. No focal confluent infiltrate is seen. Hepatobiliary: No focal liver abnormality is seen. Status post cholecystectomy. No biliary dilatation. Pancreas: The head and body of the pancreas are within normal limits. In the pancreatic tail there is a 3.2 by a 3.3 cm mass lesion which is slightly larger than that seen on the prior PET-CT at which time it measured 3.4 x 2.4 cm. This is consistent with the patient's known history of pancreatic neuroendocrine tumor. Spleen: Scattered calcified granulomas are noted. Adrenals/Urinary Tract: Adrenal glands are within normal limits. Kidneys are well visualized bilaterally. No renal calculi or obstructive changes are seen. A simple appearing cyst is noted in the upper pole of the right kidney measuring 3.7 cm relatively stable from the prior exam. No further follow-up is recommended. The ureters are within normal limits. The bladder is decompressed. Stomach/Bowel: Colon demonstrates mild diverticular change without evidence of diverticulitis. Fluid is noted throughout the colon consistent with a diarrheal state. Postsurgical changes are noted in the proximal colon. The appendix is not visualized consistent with the postoperative change. No anastomotic narrowing is seen. There is again noted and encapsulated area of fat necrosis in the right abdomen stable from previous exams. Small bowel is within normal limits. Stomach is unremarkable. Vascular/Lymphatic: Aortic atherosclerosis. No enlarged abdominal or pelvic lymph nodes. Reproductive: Prostate is unremarkable. Other: No abdominal wall hernia or abnormality. No abdominopelvic ascites. Musculoskeletal: Postsurgical changes are noted in the left hip. A stable non healed fracture is noted along the posteromedial aspect of the left  acetabulum similar to that seen 2022. Degenerative changes of lumbar spine are noted. No acute bony abnormality is seen. IMPRESSION: Persistent pancreatic tail mass consistent with the given clinical history of neuroendocrine tumor. The mass now measures 3.2 x 3.3 cm slightly larger than that seen on the prior PET-CT examination. Mild diverticular change without diverticulitis. Diffuse colonic fluid consistent with the diarrheal state in the current history of incontinence. Stable appearing undisplaced fracture involving the posteromedial aspect of the left acetabulum. Electronically Signed   By: Inez Catalina M.D.   On: 11/11/2022 20:17   CT HEAD WO CONTRAST (5MM)  Result Date: 11/11/2022 CLINICAL DATA:  Mental status change. EXAM: CT HEAD WITHOUT CONTRAST TECHNIQUE: Contiguous axial images were obtained from the base of the skull through the vertex without intravenous contrast. RADIATION DOSE REDUCTION: This exam was performed according to the departmental dose-optimization program which includes automated exposure control, adjustment of the mA and/or kV according to patient size and/or use of iterative reconstruction technique. COMPARISON:  MRI examination dated January 22, 2022; CT head dated December 09, 2016 FINDINGS: Brain: No evidence of acute infarction, hemorrhage, hydrocephalus, extra-axial collection or mass lesion/mass effect. Moderate generalized cerebral atrophy. Diffuse low-attenuation of the periventricular and subcortical white matter presumed chronic microvascular ischemic changes. Vascular: No hyperdense vessel or  unexpected calcification. Skull: Normal. Negative for fracture or focal lesion. Sinuses/Orbits: No acute finding. Other: None. IMPRESSION: 1. No acute intracranial abnormality. 2. Moderate generalized cerebral atrophy and chronic microvascular ischemic changes of the white matter. Electronically Signed   By: Keane Police D.O.   On: 11/11/2022 19:30   DG Chest Port 1 View  Result  Date: 11/11/2022 CLINICAL DATA:  Sepsis, fever, weakness EXAM: PORTABLE CHEST 1 VIEW COMPARISON:  12/09/2016 FINDINGS: Single frontal view of the chest demonstrates an unremarkable cardiac silhouette. Lung volumes are diminished, with no acute airspace disease, effusion, or pneumothorax. No acute bony abnormalities. IMPRESSION: 1. Low lung volumes.  No acute process. Electronically Signed   By: Randa Ngo M.D.   On: 11/11/2022 18:53    Microbiology: Results for orders placed or performed during the hospital encounter of 11/11/22  Blood Culture (routine x 2)     Status: None (Preliminary result)   Collection Time: 11/11/22  6:29 PM   Specimen: BLOOD  Result Value Ref Range Status   Specimen Description BLOOD RIGHT ANTECUBITAL  Final   Special Requests   Final    BOTTLES DRAWN AEROBIC AND ANAEROBIC Blood Culture adequate volume   Culture   Final    NO GROWTH 3 DAYS Performed at [facility], [address] [place], [place] [address]    Report Status PENDING  Incomplete  Urine Culture     Status: Abnormal   Collection Time: 11/11/22  6:29 PM   Specimen: Urine, Random  Result Value Ref Range Status   Specimen Description   Final    URINE, RANDOM Performed at [facility], [address] [place], [place] [address]    Special Requests   Final    NONE Performed at [facility], [address] [place], [place] [address]    Culture >=100,000 COLONIES/mL ENTEROCOCCUS FAECALIS (A)  Final   Report Status 11/13/2022 FINAL  Final   Organism ID, Bacteria ENTEROCOCCUS FAECALIS (A)  Final      Susceptibility   Enterococcus faecalis - MIC*    AMPICILLIN <=2 SENSITIVE Sensitive     NITROFURANTOIN 32 SENSITIVE Sensitive     VANCOMYCIN 1 SENSITIVE Sensitive     * >=100,000 COLONIES/mL ENTEROCOCCUS FAECALIS  Resp Panel by RT-PCR (Flu A&B, Covid) Urine, Catheterized     Status: None   Collection Time: 11/11/22  6:30 PM   Specimen: Urine, Catheterized; Nasal Swab   Result Value Ref Range Status   SARS Coronavirus 2 by RT PCR NEGATIVE NEGATIVE Final    Comment: (NOTE) SARS-CoV-2 target nucleic acids are NOT DETECTED.  The SARS-CoV-2 RNA is generally detectable in upper respiratory specimens during the acute phase of infection. The lowest concentration of SARS-CoV-2 viral copies this assay can detect is 138 copies/mL. A negative result does not preclude SARS-Cov-2 infection and should not be used as the sole basis for treatment or other patient management decisions. A negative result may occur with  improper specimen collection/handling, submission of specimen other than nasopharyngeal swab, presence of viral mutation(s) within the areas targeted by this assay, and inadequate number of viral copies(<138 copies/mL). A negative result must be combined with clinical observations, patient history, and epidemiological information. The expected result is Negative.  Fact Sheet for Patients:  EntrepreneurPulse.com.au  Fact Sheet for Healthcare Providers:  IncredibleEmployment.be  This test is no t yet approved or cleared by the [place] FDA and  has been authorized for detection and/or diagnosis of SARS-CoV-2 by FDA under an  Emergency Use Authorization (EUA). This EUA will remain  in effect (meaning this test can be used) for the duration of the COVID-19 declaration under Section 564(b)(1) of the Act, 21 U.S.C.section 360bbb-3(b)(1), unless the authorization is terminated  or revoked sooner.       Influenza A by PCR NEGATIVE NEGATIVE Final   Influenza B by PCR NEGATIVE NEGATIVE Final    Comment: (NOTE) The Xpert Xpress SARS-CoV-2/FLU/RSV plus assay is intended as an aid in the diagnosis of influenza from Nasopharyngeal swab specimens and should not be used as a sole basis for treatment. Nasal washings and aspirates are unacceptable for Xpert Xpress SARS-CoV-2/FLU/RSV testing.  Fact Sheet for  Patients: EntrepreneurPulse.com.au  Fact Sheet for Healthcare Providers: IncredibleEmployment.be  This test is not yet approved or cleared by the [place] FDA and has been authorized for detection and/or diagnosis of SARS-CoV-2 by FDA under an Emergency Use Authorization (EUA). This EUA will remain in effect (meaning this test can be used) for the duration of the COVID-19 declaration under Section 564(b)(1) of the Act, 21 U.S.C. section 360bbb-3(b)(1), unless the authorization is terminated or revoked.  Performed at [facility], [address] [place], [place] [address]   Blood Culture (routine x 2)     Status: None (Preliminary result)   Collection Time: 11/11/22  6:34 PM   Specimen: BLOOD  Result Value Ref Range Status   Specimen Description BLOOD LEFT ANTECUBITAL  Final   Special Requests   Final    BOTTLES DRAWN AEROBIC AND ANAEROBIC Blood Culture adequate volume   Culture   Final    NO GROWTH 3 DAYS Performed at [facility], [address] [place], [place] [address]    Report Status PENDING  Incomplete    Labs: CBC: Recent Labs  Lab 11/11/22 1829 11/12/22 0525 11/13/22 0535 11/14/22 0420  WBC 7.2 5.8 4.1 3.3*  NEUTROABS 6.2  --   --   --   HGB 10.1* 9.2* 8.8* 8.5*  HCT 31.5* 28.9* 27.4* 26.2*  MCV 88.0 87.6 87.5 85.3  PLT 74* 62* 66* 66*   Basic Metabolic Panel: Recent Labs  Lab 11/11/22 1829 11/12/22 0525 11/13/22 0535 11/14/22 0420  NA 134* 136 137 137  K 4.5 4.5 4.3 3.9  CL 104 108 109 110  CO2 19* '22 22 23  '$ GLUCOSE 160* 133* 115* 125*  BUN 35* 33* 28* 24*  CREATININE 2.17* 1.90* 1.65* 1.48*  CALCIUM 8.3* 7.9* 7.6* 7.5*   Liver Function Tests: Recent Labs  Lab 11/11/22 1829 11/13/22 0535  AST 18 16  ALT 9 7  ALKPHOS 58 45  BILITOT 1.1 1.0  PROT 6.4* 5.5*  ALBUMIN 3.4* 2.6*   CBG: Recent Labs  Lab 11/12/22 2325 11/13/22 0523 11/13/22 0854 11/13/22 1123 11/13/22 1636   GLUCAP 104* 98 102* 121* 113*    Discharge time spent: greater than 30 minutes.  Signed: Sharen Hones, MD [facility] 11/14/2022

## 2022-11-14 NOTE — Progress Notes (Signed)
Mobility Specialist - Progress Note   11/14/22 0951  Mobility  Activity Ambulated with assistance in hallway  Level of Assistance Contact guard assist, steadying assist  Assistive Device None  Distance Ambulated (ft) 100 ft  Activity Response Tolerated well  Mobility Referral Yes  $Mobility charge 1 Mobility   Pt sitting in recliner on RA upon arrival. Pt STS and ambulates in hallway CGA with no LOB. Pt returns to recliner with needs in reach, chair alarm on, and family in room.   Gretchen Short  Mobility Specialist  11/14/22 9:53 AM

## 2022-11-14 NOTE — Care Management Important Message (Signed)
Important Message  Patient Details  Name: Daniel Sherman MRN: [id number] Date of Birth: Apr 30, 1941   Medicare Important Message Given:  Yes     Dannette Barbara 11/14/2022, 11:28 AM

## 2022-11-16 LAB — CULTURE, BLOOD (ROUTINE X 2)
Culture: NO GROWTH
Culture: NO GROWTH
Special Requests: ADEQUATE
Special Requests: ADEQUATE

## 2022-12-31 DIAGNOSIS — D509 Iron deficiency anemia, unspecified: Secondary | ICD-10-CM | POA: Diagnosis not present

## 2022-12-31 DIAGNOSIS — I25118 Atherosclerotic heart disease of native coronary artery with other forms of angina pectoris: Secondary | ICD-10-CM | POA: Diagnosis not present

## 2022-12-31 DIAGNOSIS — Z87440 Personal history of urinary (tract) infections: Secondary | ICD-10-CM | POA: Diagnosis not present

## 2022-12-31 DIAGNOSIS — E538 Deficiency of other specified B group vitamins: Secondary | ICD-10-CM | POA: Diagnosis not present

## 2022-12-31 DIAGNOSIS — I10 Essential (primary) hypertension: Secondary | ICD-10-CM | POA: Diagnosis not present

## 2023-01-10 DIAGNOSIS — F0280 Dementia in other diseases classified elsewhere without behavioral disturbance: Secondary | ICD-10-CM | POA: Diagnosis not present

## 2023-01-10 DIAGNOSIS — G309 Alzheimer's disease, unspecified: Secondary | ICD-10-CM | POA: Diagnosis not present

## 2023-01-10 DIAGNOSIS — F419 Anxiety disorder, unspecified: Secondary | ICD-10-CM | POA: Diagnosis not present

## 2023-01-10 DIAGNOSIS — Z8669 Personal history of other diseases of the nervous system and sense organs: Secondary | ICD-10-CM | POA: Diagnosis not present

## 2023-01-10 DIAGNOSIS — F0150 Vascular dementia without behavioral disturbance: Secondary | ICD-10-CM | POA: Diagnosis not present

## 2023-01-17 NOTE — Assessment & Plan Note (Addendum)
#  Pancreatic neuroendocrine tumor, 2.6 cm--> increased size 3.2 x 3.3 cm on recent CT scan. Elevated chromogranin level, today's level is pending.  Per patient's son, patient is currently not taking omeprazole. I had a lengthy discussion with patient and his power of attorney son Patient currently is fairly asymptomatic from neuroendocrine carcinoma.  He has no pain, facial flushing, diarrhea. Size of the tumor is gradually increasing.  Options including observation/somatostatin/everolimus/sunitinib/Lutathera etc. Patient has advanced dementia, POA is not interested in aggressive treatments. Shared decision was made to hold off additional imaging given that patient will not take any treatments. Refer to palliative care service for further discussion.

## 2023-01-18 ENCOUNTER — Inpatient Hospital Stay: Payer: Medicare HMO | Attending: Oncology | Admitting: Oncology

## 2023-01-18 ENCOUNTER — Inpatient Hospital Stay: Payer: Medicare HMO

## 2023-01-18 ENCOUNTER — Encounter: Payer: Self-pay | Admitting: Oncology

## 2023-01-18 VITALS — BP 108/64 | HR 79 | Temp 97.2°F | Resp 18 | Wt 167.5 lb

## 2023-01-18 DIAGNOSIS — Z7984 Long term (current) use of oral hypoglycemic drugs: Secondary | ICD-10-CM | POA: Diagnosis not present

## 2023-01-18 DIAGNOSIS — E1122 Type 2 diabetes mellitus with diabetic chronic kidney disease: Secondary | ICD-10-CM | POA: Insufficient documentation

## 2023-01-18 DIAGNOSIS — Z79899 Other long term (current) drug therapy: Secondary | ICD-10-CM | POA: Insufficient documentation

## 2023-01-18 DIAGNOSIS — N189 Chronic kidney disease, unspecified: Secondary | ICD-10-CM | POA: Insufficient documentation

## 2023-01-18 DIAGNOSIS — I252 Old myocardial infarction: Secondary | ICD-10-CM | POA: Insufficient documentation

## 2023-01-18 DIAGNOSIS — I129 Hypertensive chronic kidney disease with stage 1 through stage 4 chronic kidney disease, or unspecified chronic kidney disease: Secondary | ICD-10-CM | POA: Diagnosis not present

## 2023-01-18 DIAGNOSIS — Z9049 Acquired absence of other specified parts of digestive tract: Secondary | ICD-10-CM | POA: Insufficient documentation

## 2023-01-18 DIAGNOSIS — D696 Thrombocytopenia, unspecified: Secondary | ICD-10-CM | POA: Insufficient documentation

## 2023-01-18 DIAGNOSIS — Z7189 Other specified counseling: Secondary | ICD-10-CM | POA: Diagnosis not present

## 2023-01-18 DIAGNOSIS — R161 Splenomegaly, not elsewhere classified: Secondary | ICD-10-CM | POA: Diagnosis not present

## 2023-01-18 DIAGNOSIS — E041 Nontoxic single thyroid nodule: Secondary | ICD-10-CM | POA: Diagnosis not present

## 2023-01-18 DIAGNOSIS — C182 Malignant neoplasm of ascending colon: Secondary | ICD-10-CM

## 2023-01-18 DIAGNOSIS — D6949 Other primary thrombocytopenia: Secondary | ICD-10-CM

## 2023-01-18 DIAGNOSIS — D631 Anemia in chronic kidney disease: Secondary | ICD-10-CM | POA: Insufficient documentation

## 2023-01-18 DIAGNOSIS — N1832 Chronic kidney disease, stage 3b: Secondary | ICD-10-CM

## 2023-01-18 DIAGNOSIS — D3A8 Other benign neuroendocrine tumors: Secondary | ICD-10-CM | POA: Diagnosis not present

## 2023-01-18 DIAGNOSIS — Z85038 Personal history of other malignant neoplasm of large intestine: Secondary | ICD-10-CM | POA: Diagnosis not present

## 2023-01-18 DIAGNOSIS — C7A098 Malignant carcinoid tumors of other sites: Secondary | ICD-10-CM | POA: Insufficient documentation

## 2023-01-18 LAB — COMPREHENSIVE METABOLIC PANEL
ALT: 10 U/L (ref 0–44)
AST: 15 U/L (ref 15–41)
Albumin: 4.2 g/dL (ref 3.5–5.0)
Alkaline Phosphatase: 83 U/L (ref 38–126)
Anion gap: 11 (ref 5–15)
BUN: 24 mg/dL — ABNORMAL HIGH (ref 8–23)
CO2: 23 mmol/L (ref 22–32)
Calcium: 9 mg/dL (ref 8.9–10.3)
Chloride: 101 mmol/L (ref 98–111)
Creatinine, Ser: 1.59 mg/dL — ABNORMAL HIGH (ref 0.61–1.24)
GFR, Estimated: 43 mL/min — ABNORMAL LOW (ref >60)
Glucose, Bld: 133 mg/dL — ABNORMAL HIGH (ref 70–99)
Potassium: 4.5 mmol/L (ref 3.5–5.1)
Sodium: 135 mmol/L (ref 135–145)
Total Bilirubin: 0.7 mg/dL (ref 0.3–1.2)
Total Protein: 7.5 g/dL (ref 6.5–8.1)

## 2023-01-18 LAB — CBC WITH DIFFERENTIAL/PLATELET
Abs Immature Granulocytes: 0.03 10*3/uL (ref 0.00–0.07)
Basophils Absolute: 0 10*3/uL (ref 0.0–0.1)
Basophils Relative: 1 %
Eosinophils Absolute: 0.2 10*3/uL (ref 0.0–0.5)
Eosinophils Relative: 4 %
HCT: 37.3 % — ABNORMAL LOW (ref 39.0–52.0)
Hemoglobin: 11.6 g/dL — ABNORMAL LOW (ref 13.0–17.0)
Immature Granulocytes: 1 %
Lymphocytes Relative: 24 %
Lymphs Abs: 1.1 10*3/uL (ref 0.7–4.0)
MCH: 27 pg (ref 26.0–34.0)
MCHC: 31.1 g/dL (ref 30.0–36.0)
MCV: 86.9 fL (ref 80.0–100.0)
Monocytes Absolute: 0.4 10*3/uL (ref 0.1–1.0)
Monocytes Relative: 8 %
Neutro Abs: 3 10*3/uL (ref 1.7–7.7)
Neutrophils Relative %: 62 %
Platelets: 116 10*3/uL — ABNORMAL LOW (ref 150–400)
RBC: 4.29 MIL/uL (ref 4.22–5.81)
RDW: 13.7 % (ref 11.5–15.5)
WBC: 4.7 10*3/uL (ref 4.0–10.5)
nRBC: 0 % (ref 0.0–0.2)

## 2023-01-18 NOTE — Assessment & Plan Note (Signed)
Discussed with patient and POA.

## 2023-01-18 NOTE — Assessment & Plan Note (Signed)
Hold off additional workup due to his age and multiple medical problems.

## 2023-01-18 NOTE — Progress Notes (Signed)
Pt here for follow up. Son/ POA with patient today and states that memory is getting worse.
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
## 2023-01-18 NOTE — Assessment & Plan Note (Signed)
#  History of right side colon cancer, stage I.-Status post right hemicolectomy on 08/12/2019. CEA stable, today's level is pending. He and family have declined further colonoscopy surveillance.

## 2023-01-18 NOTE — Assessment & Plan Note (Signed)
Secondary to splenomegaly.  Counts are stable.  Continue observation.

## 2023-01-18 NOTE — Assessment & Plan Note (Signed)
Labs reviewed.  Hemoglobin has been stable.

## 2023-01-19 LAB — CEA: CEA: 0.6 ng/mL (ref 0.0–4.7)

## 2023-01-22 ENCOUNTER — Inpatient Hospital Stay (HOSPITAL_BASED_OUTPATIENT_CLINIC_OR_DEPARTMENT_OTHER): Payer: Medicare HMO | Admitting: Hospice and Palliative Medicine

## 2023-01-22 DIAGNOSIS — D3A8 Other benign neuroendocrine tumors: Secondary | ICD-10-CM

## 2023-01-22 DIAGNOSIS — C182 Malignant neoplasm of ascending colon: Secondary | ICD-10-CM

## 2023-01-22 LAB — CHROMOGRANIN A: Chromogranin A (ng/mL): 115.8 ng/mL — ABNORMAL HIGH (ref 0.0–101.8)

## 2023-01-22 NOTE — Progress Notes (Signed)
Virtual Visit via Video Note  I connected with [place] on 01/22/23 at  2:20 PM EST by a video enabled telemedicine application and verified that I am speaking with the correct person using two identifiers.  Location: Patient: Home Provider: Clinic   I discussed the limitations of evaluation and management by telemedicine and the availability of in person appointments. The patient expressed understanding and agreed to proceed.  History of Present Illness: Daniel Sherman is an 82 year old male with multiple medical problems including advanced dementia who was recently diagnosed with primary pancreatic neuroendocrine tumor.  Family has decided not to pursue cancer treatment or further workup.  He was referred to palliative care to address goals.   Observations/Objective: Virtual visit with patient's son and daughter-in-law.  Patient was unable to participate in the visit due to his advanced dementia.  Son is patient's HCPOA.  I had a long conversation with family regarding patient's diagnosis of pancreatic cancer and their overall goals of ensuring that he is comfortable and focusing on quality of life.  They confirm they are not interested in pursuing any further workup or treatment of his cancer.  They verbalized understanding that cancer will likely progress and may be life ending at some point.  Family states that patient's dementia has been worsening.  At baseline, he lives at home with his wife who is his primary caregiver.  Patient often cannot recall his own name.  He often does not speak in complete sentences.  He requires family assistance for ADLs.  We discussed the option of involving palliative care versus hospice at home.  Patient would be a good candidate for hospice to assist given expected decline.  However, son does not feel that his mother is yet at a point where she would accept care at home.  Family have considered possible transition to an ALF or other facility.  They  are interested in speaking with social work to discuss available resources.  Symptomatically, patient has no pain or other distressing symptoms.  Son says that patient is a DNR/DNI.  He apparently has a signed DNR order at home.   Assessment and Plan: Pancreatic cancer -family not interested in pursuing workup or treatment.  They want to focus on comfort and quality of life at home.  Hospice was discussed in detail but family not yet interested.  They also do not yet want him followed at home by palliative care.  Will continue periodic telephone visits.  Dementia -worsening.  Followed by neurology.  Family interested in speaking with social work regarding possible resources/facility placement.  Follow Up Instructions: Follow-up telephone visit 1 to 2 months   I discussed the assessment and treatment plan with the patient. The patient was provided an opportunity to ask questions and all were answered. The patient agreed with the plan and demonstrated an understanding of the instructions.   The patient was advised to call back or seek an in-person evaluation if the symptoms worsen or if the condition fails to improve as anticipated.  I provided 25 minutes of non-face-to-face time during this encounter.   Irean Hong, NP

## 2023-01-23 ENCOUNTER — Encounter: Payer: Self-pay | Admitting: Licensed Clinical Social Worker

## 2023-01-23 NOTE — Progress Notes (Signed)
[place] Work  Clinical Social Work was referred by medical provider for assessment of psychosocial needs.  Clinical Social Worker contacted caregiver by phone  to offer support and assess for needs.  CSW spoke with patient's son Daniel Sherman, [place].  Mr. Daniel Sherman stated he was unable to speak at the moment.  CSW stated I would contact him tomorrow after 1:00PM.  [name] verbalized agreement.    FA   Adelene Amas, LCSW  Clinical Social Worker [facility]

## 2023-01-28 ENCOUNTER — Encounter: Payer: Self-pay | Admitting: Licensed Clinical Social Worker

## 2023-01-28 NOTE — Progress Notes (Signed)
[place] CSW Progress Note  Clinical Social Work was referred by medical provider for assessment of psychosocial needs.  Clinical Social Worker contacted caregiver by phone  to offer support and assess for needs.  CSW spoke with patient's son Daniel Sherman, [place].  [name] stated he was unable to speak at the moment. CSW gave [name] contact information and requested [name] contact me at his convenience.  [name] verbalized understanding.  TA  [employer], LCSW

## 2023-01-31 DIAGNOSIS — D509 Iron deficiency anemia, unspecified: Secondary | ICD-10-CM | POA: Diagnosis not present

## 2023-01-31 DIAGNOSIS — E538 Deficiency of other specified B group vitamins: Secondary | ICD-10-CM | POA: Diagnosis not present

## 2023-01-31 DIAGNOSIS — G301 Alzheimer's disease with late onset: Secondary | ICD-10-CM | POA: Diagnosis not present

## 2023-01-31 DIAGNOSIS — N1832 Chronic kidney disease, stage 3b: Secondary | ICD-10-CM | POA: Diagnosis not present

## 2023-01-31 DIAGNOSIS — F0280 Dementia in other diseases classified elsewhere without behavioral disturbance: Secondary | ICD-10-CM | POA: Diagnosis not present

## 2023-01-31 DIAGNOSIS — I129 Hypertensive chronic kidney disease with stage 1 through stage 4 chronic kidney disease, or unspecified chronic kidney disease: Secondary | ICD-10-CM | POA: Diagnosis not present

## 2023-02-26 DIAGNOSIS — N1832 Chronic kidney disease, stage 3b: Secondary | ICD-10-CM | POA: Diagnosis not present

## 2023-02-26 DIAGNOSIS — D509 Iron deficiency anemia, unspecified: Secondary | ICD-10-CM | POA: Diagnosis not present

## 2023-03-09 ENCOUNTER — Ambulatory Visit
Admission: EM | Admit: 2023-03-09 | Discharge: 2023-03-09 | Disposition: A | Discharge status: Home / Self Care | Payer: Medicare HMO | Attending: Emergency Medicine | Admitting: Emergency Medicine

## 2023-03-09 DIAGNOSIS — J0100 Acute maxillary sinusitis, unspecified: Secondary | ICD-10-CM | POA: Diagnosis not present

## 2023-03-09 DIAGNOSIS — J209 Acute bronchitis, unspecified: Secondary | ICD-10-CM | POA: Diagnosis not present

## 2023-03-09 MED ORDER — AMOXICILLIN 875 MG PO TABS: 875.0000 mg | ORAL_TABLET | Freq: Two times a day (BID) | ORAL | 0 refills | Status: AC
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
## 2023-03-09 NOTE — Discharge Instructions (Addendum)
Take the amoxicillin as directed.  Follow up with your primary care provider on Monday.

## 2023-03-09 NOTE — ED Triage Notes (Signed)
Patient to Urgent Care with wife, complaints of wet sounding cough and nasal congestion.   Symptoms started 8 days ago. Denies any known fevers.  Has been trying to mucinex cold and flu.

## 2023-03-14 DIAGNOSIS — J40 Bronchitis, not specified as acute or chronic: Secondary | ICD-10-CM | POA: Diagnosis not present

## 2023-04-12 ENCOUNTER — Telehealth: Payer: Self-pay

## 2023-04-12 NOTE — Telephone Encounter (Signed)
Patient son called requesting to speak to Social Work. Stating she was helping them with assistance with patient because patient has dementia.

## 2023-04-15 ENCOUNTER — Encounter: Payer: Self-pay | Admitting: Licensed Clinical Social Worker

## 2023-04-15 NOTE — Progress Notes (Signed)
[facility] CSW Progress Note  Clinical Child psychotherapist contacted caregiver by phone in response to request made to CMA.  CSW ;eft voicemail with contact information and request for return call.    Joseph Art, LCSW Clinical Social Worker [facility]

## 2023-04-19 ENCOUNTER — Inpatient Hospital Stay: Payer: Medicare HMO | Attending: Oncology | Admitting: Licensed Clinical Social Worker

## 2023-04-19 DIAGNOSIS — D3A8 Other benign neuroendocrine tumors: Secondary | ICD-10-CM

## 2023-04-19 DIAGNOSIS — C182 Malignant neoplasm of ascending colon: Secondary | ICD-10-CM

## 2023-04-19 NOTE — Progress Notes (Signed)
[facility] Clinical Social Work  Initial Assessment   Daniel Sherman is a 82 y.o. year old male contacted caregiver by phone. Clinical Social Work was referred by medical provider for assessment of psychosocial needs.   SDOH (Social Determinants of Health) assessments performed: Yes SDOH Interventions    Flowsheet Row Clinical Support from 04/19/2023 in [facility] at [facility]  SDOH Interventions   Alcohol Usage Interventions Intervention Not Indicated (Score <7)  Financial Strain Interventions Intervention Not Indicated  Physical Activity Interventions Intervention Not Indicated  Stress Interventions Intervention Not Indicated  Social Connections Interventions Intervention Not Indicated       SDOH Screenings   Food Insecurity: No Food Insecurity (11/13/2022)  Housing: Low Risk  (11/13/2022)  Transportation Needs: No Transportation Needs (11/13/2022)  Utilities: Not At Risk (11/13/2022)  Alcohol Screen: Low Risk  (04/19/2023)  Depression (PHQ2-9): Low Risk  (04/19/2023)  Financial Resource Strain: Medium Risk (04/19/2023)  Physical Activity: Inactive (04/19/2023)  Social Connections: Moderately Isolated (04/19/2023)  Stress: Stress Concern Present (04/19/2023)  Tobacco Use: Medium Risk (03/09/2023)     Distress Screen completed: No     No data to display            Family/Social Information:  Housing Arrangement: patient lives with spouse  main contact is son Daniel Sherman [phone] Family members/support persons in your life? Family and Medical Staff Transportation concerns: yes, patient's family provides transportation  Employment: Retired.  Income source: Actor concerns: No immediate concerns,  main financial concerns is affordability of caregiving for patient. Type of concern: Care giving (Child care or elder care services) Food access concerns: no Religious or spiritual practice: Not known Services Currently in place:   [employer] MED[employer]ARE HMO   Coping/ Adjustment to diagnosis: Patient understands treatment plan and what happens next? patient has dx of dementia Concerns about diagnosis and/or treatment: How I will care for other members of my family, How I will pay for the services I need, and Quality of life Patient reported stressors: Childcare/ elder care Hopes and/or priorities:   Patient enjoys    Current coping skills/ strengths: Average or above average intelligence , Financial means , and Supportive family/friends     SUMMARY: Current SDOH Barriers:  Limited social support  Clinical Social Work Clinical Goal(s):  Patient will follow up with [facility] at [facility]* as directed by SW No clinical social work goals at this time  Interventions: Discussed common feeling and emotions when being diagnosed with cancer, and the importance of support during treatment Informed patient of the support team roles and support services at [facility] Provided CSW contact information and encouraged patient to call with any questions or concerns Referred patient to [facility] DSS and Provided patient with information about CSW role in patient car and other available resources.   Follow Up Plan: Patient will contact CSW with any support or resource needs Patient verbalizes understanding of plan: Yes    Joseph Art, LCSW Clinical Social Worker [facility]

## 2023-05-01 DIAGNOSIS — N1831 Chronic kidney disease, stage 3a: Secondary | ICD-10-CM | POA: Diagnosis not present

## 2023-05-01 DIAGNOSIS — D61818 Other pancytopenia: Secondary | ICD-10-CM | POA: Diagnosis not present

## 2023-05-01 DIAGNOSIS — I25118 Atherosclerotic heart disease of native coronary artery with other forms of angina pectoris: Secondary | ICD-10-CM | POA: Diagnosis not present

## 2023-05-01 DIAGNOSIS — D6949 Other primary thrombocytopenia: Secondary | ICD-10-CM | POA: Diagnosis not present

## 2023-05-01 DIAGNOSIS — E538 Deficiency of other specified B group vitamins: Secondary | ICD-10-CM | POA: Diagnosis not present

## 2023-05-01 DIAGNOSIS — E1122 Type 2 diabetes mellitus with diabetic chronic kidney disease: Secondary | ICD-10-CM | POA: Diagnosis not present

## 2023-05-01 DIAGNOSIS — E1159 Type 2 diabetes mellitus with other circulatory complications: Secondary | ICD-10-CM | POA: Diagnosis not present

## 2023-05-01 DIAGNOSIS — D509 Iron deficiency anemia, unspecified: Secondary | ICD-10-CM | POA: Diagnosis not present

## 2023-05-01 DIAGNOSIS — Z0000 Encounter for general adult medical examination without abnormal findings: Secondary | ICD-10-CM | POA: Diagnosis not present

## 2023-05-10 ENCOUNTER — Observation Stay: Payer: Medicare HMO

## 2023-05-10 ENCOUNTER — Other Ambulatory Visit: Payer: Self-pay

## 2023-05-10 ENCOUNTER — Emergency Department: Payer: Medicare HMO

## 2023-05-10 ENCOUNTER — Inpatient Hospital Stay
Admission: EM | Admit: 2023-05-10 | Discharge: 2023-05-13 | DRG: 689 | Disposition: A | Discharge status: Home Health | Payer: Medicare HMO | Attending: Obstetrics and Gynecology | Admitting: Obstetrics and Gynecology

## 2023-05-10 DIAGNOSIS — Z7984 Long term (current) use of oral hypoglycemic drugs: Secondary | ICD-10-CM

## 2023-05-10 DIAGNOSIS — N390 Urinary tract infection, site not specified: Principal | ICD-10-CM | POA: Diagnosis present

## 2023-05-10 DIAGNOSIS — I2510 Atherosclerotic heart disease of native coronary artery without angina pectoris: Secondary | ICD-10-CM | POA: Diagnosis present

## 2023-05-10 DIAGNOSIS — D696 Thrombocytopenia, unspecified: Secondary | ICD-10-CM

## 2023-05-10 DIAGNOSIS — Z8249 Family history of ischemic heart disease and other diseases of the circulatory system: Secondary | ICD-10-CM

## 2023-05-10 DIAGNOSIS — E1122 Type 2 diabetes mellitus with diabetic chronic kidney disease: Secondary | ICD-10-CM

## 2023-05-10 DIAGNOSIS — D6949 Other primary thrombocytopenia: Secondary | ICD-10-CM | POA: Diagnosis present

## 2023-05-10 DIAGNOSIS — I959 Hypotension, unspecified: Secondary | ICD-10-CM | POA: Diagnosis not present

## 2023-05-10 DIAGNOSIS — G9340 Encephalopathy, unspecified: Secondary | ICD-10-CM | POA: Diagnosis not present

## 2023-05-10 DIAGNOSIS — D61818 Other pancytopenia: Secondary | ICD-10-CM | POA: Diagnosis not present

## 2023-05-10 DIAGNOSIS — D3A8 Other benign neuroendocrine tumors: Secondary | ICD-10-CM | POA: Diagnosis not present

## 2023-05-10 DIAGNOSIS — M6282 Rhabdomyolysis: Secondary | ICD-10-CM | POA: Diagnosis not present

## 2023-05-10 DIAGNOSIS — Z1152 Encounter for screening for COVID-19: Secondary | ICD-10-CM | POA: Diagnosis not present

## 2023-05-10 DIAGNOSIS — I10 Essential (primary) hypertension: Secondary | ICD-10-CM | POA: Diagnosis not present

## 2023-05-10 DIAGNOSIS — R410 Disorientation, unspecified: Secondary | ICD-10-CM | POA: Diagnosis not present

## 2023-05-10 DIAGNOSIS — F32A Depression, unspecified: Secondary | ICD-10-CM | POA: Diagnosis present

## 2023-05-10 DIAGNOSIS — R4182 Altered mental status, unspecified: Secondary | ICD-10-CM

## 2023-05-10 DIAGNOSIS — N1831 Chronic kidney disease, stage 3a: Secondary | ICD-10-CM | POA: Diagnosis present

## 2023-05-10 DIAGNOSIS — Z794 Long term (current) use of insulin: Secondary | ICD-10-CM | POA: Diagnosis not present

## 2023-05-10 DIAGNOSIS — Z87891 Personal history of nicotine dependence: Secondary | ICD-10-CM

## 2023-05-10 DIAGNOSIS — Z85038 Personal history of other malignant neoplasm of large intestine: Secondary | ICD-10-CM

## 2023-05-10 DIAGNOSIS — R531 Weakness: Secondary | ICD-10-CM | POA: Diagnosis not present

## 2023-05-10 DIAGNOSIS — F0390 Unspecified dementia without behavioral disturbance: Secondary | ICD-10-CM | POA: Diagnosis not present

## 2023-05-10 DIAGNOSIS — E860 Dehydration: Secondary | ICD-10-CM | POA: Diagnosis present

## 2023-05-10 DIAGNOSIS — Z96642 Presence of left artificial hip joint: Secondary | ICD-10-CM | POA: Diagnosis present

## 2023-05-10 DIAGNOSIS — G928 Other toxic encephalopathy: Secondary | ICD-10-CM | POA: Diagnosis not present

## 2023-05-10 DIAGNOSIS — E785 Hyperlipidemia, unspecified: Secondary | ICD-10-CM | POA: Diagnosis not present

## 2023-05-10 DIAGNOSIS — F0280 Dementia in other diseases classified elsewhere without behavioral disturbance: Secondary | ICD-10-CM

## 2023-05-10 DIAGNOSIS — F0393 Unspecified dementia, unspecified severity, with mood disturbance: Secondary | ICD-10-CM | POA: Diagnosis not present

## 2023-05-10 DIAGNOSIS — I129 Hypertensive chronic kidney disease with stage 1 through stage 4 chronic kidney disease, or unspecified chronic kidney disease: Secondary | ICD-10-CM | POA: Diagnosis present

## 2023-05-10 DIAGNOSIS — Z9049 Acquired absence of other specified parts of digestive tract: Secondary | ICD-10-CM | POA: Diagnosis not present

## 2023-05-10 DIAGNOSIS — M1990 Unspecified osteoarthritis, unspecified site: Secondary | ICD-10-CM | POA: Diagnosis present

## 2023-05-10 DIAGNOSIS — K219 Gastro-esophageal reflux disease without esophagitis: Secondary | ICD-10-CM | POA: Diagnosis present

## 2023-05-10 DIAGNOSIS — I252 Old myocardial infarction: Secondary | ICD-10-CM | POA: Diagnosis not present

## 2023-05-10 DIAGNOSIS — N179 Acute kidney failure, unspecified: Secondary | ICD-10-CM | POA: Diagnosis not present

## 2023-05-10 DIAGNOSIS — N183 Chronic kidney disease, stage 3 unspecified: Secondary | ICD-10-CM

## 2023-05-10 DIAGNOSIS — E119 Type 2 diabetes mellitus without complications: Secondary | ICD-10-CM

## 2023-05-10 DIAGNOSIS — R0602 Shortness of breath: Secondary | ICD-10-CM | POA: Diagnosis not present

## 2023-05-10 DIAGNOSIS — R0689 Other abnormalities of breathing: Secondary | ICD-10-CM | POA: Diagnosis not present

## 2023-05-10 DIAGNOSIS — Z8616 Personal history of COVID-19: Secondary | ICD-10-CM | POA: Diagnosis not present

## 2023-05-10 DIAGNOSIS — Z79899 Other long term (current) drug therapy: Secondary | ICD-10-CM

## 2023-05-10 DIAGNOSIS — R509 Fever, unspecified: Secondary | ICD-10-CM | POA: Diagnosis not present

## 2023-05-10 DIAGNOSIS — J9811 Atelectasis: Secondary | ICD-10-CM | POA: Diagnosis not present

## 2023-05-10 LAB — CBC WITH DIFFERENTIAL/PLATELET
Abs Immature Granulocytes: 0.03 10*3/uL (ref 0.00–0.07)
Basophils Absolute: 0 10*3/uL (ref 0.0–0.1)
Basophils Relative: 0 %
Eosinophils Absolute: 0 10*3/uL (ref 0.0–0.5)
Eosinophils Relative: 0 %
HCT: 34.9 % — ABNORMAL LOW (ref 39.0–52.0)
Hemoglobin: 10.9 g/dL — ABNORMAL LOW (ref 13.0–17.0)
Immature Granulocytes: 0 %
Lymphocytes Relative: 4 %
Lymphs Abs: 0.3 10*3/uL — ABNORMAL LOW (ref 0.7–4.0)
MCH: 26.4 pg (ref 26.0–34.0)
MCHC: 31.2 g/dL (ref 30.0–36.0)
MCV: 84.5 fL (ref 80.0–100.0)
Monocytes Absolute: 0.4 10*3/uL (ref 0.1–1.0)
Monocytes Relative: 5 %
Neutro Abs: 6.3 10*3/uL (ref 1.7–7.7)
Neutrophils Relative %: 91 %
Platelets: 75 10*3/uL — ABNORMAL LOW (ref 150–400)
RBC: 4.13 MIL/uL — ABNORMAL LOW (ref 4.22–5.81)
RDW: 14 % (ref 11.5–15.5)
WBC: 6.9 10*3/uL (ref 4.0–10.5)
nRBC: 0 % (ref 0.0–0.2)

## 2023-05-10 LAB — URINALYSIS, W/ REFLEX TO CULTURE (INFECTION SUSPECTED)
Bilirubin Urine: NEGATIVE
Glucose, UA: NEGATIVE mg/dL
Ketones, ur: NEGATIVE mg/dL
Nitrite: NEGATIVE
Protein, ur: 100 mg/dL — AB
Specific Gravity, Urine: 1.014 (ref 1.005–1.030)
WBC, UA: >50 WBC/hpf (ref 0–5)
pH: 8 (ref 5.0–8.0)

## 2023-05-10 LAB — COMPREHENSIVE METABOLIC PANEL
ALT: 10 U/L (ref 0–44)
AST: 15 U/L (ref 15–41)
Albumin: 3.8 g/dL (ref 3.5–5.0)
Alkaline Phosphatase: 66 U/L (ref 38–126)
Anion gap: 10 (ref 5–15)
BUN: 38 mg/dL — ABNORMAL HIGH (ref 8–23)
CO2: 23 mmol/L (ref 22–32)
Calcium: 8.6 mg/dL — ABNORMAL LOW (ref 8.9–10.3)
Chloride: 105 mmol/L (ref 98–111)
Creatinine, Ser: 2.21 mg/dL — ABNORMAL HIGH (ref 0.61–1.24)
GFR, Estimated: 29 mL/min — ABNORMAL LOW (ref >60)
Glucose, Bld: 156 mg/dL — ABNORMAL HIGH (ref 70–99)
Potassium: 5 mmol/L (ref 3.5–5.1)
Sodium: 138 mmol/L (ref 135–145)
Total Bilirubin: 1.2 mg/dL (ref 0.3–1.2)
Total Protein: 7 g/dL (ref 6.5–8.1)

## 2023-05-10 LAB — GLUCOSE, CAPILLARY: Glucose-Capillary: 108 mg/dL — ABNORMAL HIGH (ref 70–99)

## 2023-05-10 LAB — LACTIC ACID, PLASMA
Lactic Acid, Venous: 1.2 mmol/L (ref 0.5–1.9)
Lactic Acid, Venous: 0.9 mmol/L (ref 0.5–1.9)
Lactic Acid, Venous: 1.5 mmol/L (ref 0.5–1.9)

## 2023-05-10 LAB — SARS CORONAVIRUS 2 BY RT PCR: SARS Coronavirus 2 by RT PCR: NEGATIVE

## 2023-05-10 LAB — CBG MONITORING, ED
Glucose-Capillary: 116 mg/dL — ABNORMAL HIGH (ref 70–99)
Glucose-Capillary: 100 mg/dL — ABNORMAL HIGH (ref 70–99)

## 2023-05-10 LAB — CK: Total CK: 1114 U/L — ABNORMAL HIGH (ref 49–397)

## 2023-05-10 MED ORDER — ONDANSETRON HCL 4 MG/2ML IJ SOLN
4.0000 mg | Freq: Four times a day (QID) | INTRAMUSCULAR | Status: DC | PRN
  Filled 2023-05-10: qty 2

## 2023-05-10 MED ORDER — SODIUM CHLORIDE 0.9 % IV SOLN
1.0000 g | INTRAVENOUS | Status: DC
  Administered 2023-05-11 – 2023-05-13 (×3): 1 g via INTRAVENOUS
  Filled 2023-05-10 (×3): qty 10

## 2023-05-10 MED ORDER — DONEPEZIL HCL 5 MG PO TABS
10.0000 mg | ORAL_TABLET | Freq: Every day | ORAL | Status: DC
  Administered 2023-05-10 – 2023-05-13 (×4): 10 mg via ORAL
  Filled 2023-05-10 (×4): qty 2

## 2023-05-10 MED ORDER — LACTATED RINGERS IV BOLUS
500.0000 mL | Freq: Once | INTRAVENOUS | Status: AC
  Administered 2023-05-10: 500 mL via INTRAVENOUS

## 2023-05-10 MED ORDER — COLESTIPOL HCL 1 G PO TABS
1.0000 g | ORAL_TABLET | Freq: Two times a day (BID) | ORAL | Status: DC
  Administered 2023-05-10: 1 g via ORAL
  Filled 2023-05-10 (×4): qty 1

## 2023-05-10 MED ORDER — ACETAMINOPHEN 325 MG PO TABS
650.0000 mg | ORAL_TABLET | Freq: Once | ORAL | Status: AC
  Administered 2023-05-10: 650 mg via ORAL
  Filled 2023-05-10: qty 2

## 2023-05-10 MED ORDER — ESCITALOPRAM OXALATE 10 MG PO TABS
5.0000 mg | ORAL_TABLET | Freq: Every day | ORAL | Status: DC
  Administered 2023-05-10 – 2023-05-12 (×3): 5 mg via ORAL
  Filled 2023-05-10 (×3): qty 1

## 2023-05-10 MED ORDER — ENOXAPARIN SODIUM 30 MG/0.3ML IJ SOSY: 30.0000 mg | PREFILLED_SYRINGE | INTRAMUSCULAR | Status: DC

## 2023-05-10 MED ORDER — ONDANSETRON HCL 4 MG PO TABS: 4.0000 mg | ORAL_TABLET | Freq: Four times a day (QID) | ORAL | Status: DC | PRN

## 2023-05-10 MED ORDER — ACETAMINOPHEN 325 MG PO TABS
650.0000 mg | ORAL_TABLET | Freq: Four times a day (QID) | ORAL | Status: DC | PRN
  Administered 2023-05-10: 650 mg via ORAL
  Filled 2023-05-10 (×2): qty 2

## 2023-05-10 MED ORDER — MEMANTINE HCL 5 MG PO TABS
10.0000 mg | ORAL_TABLET | Freq: Two times a day (BID) | ORAL | Status: DC
  Administered 2023-05-10 – 2023-05-13 (×6): 10 mg via ORAL
  Filled 2023-05-10 (×7): qty 2

## 2023-05-10 MED ORDER — SODIUM CHLORIDE 0.9 % IV SOLN
1.0000 g | Freq: Once | INTRAVENOUS | Status: AC
  Administered 2023-05-10: 1 g via INTRAVENOUS
  Filled 2023-05-10: qty 10

## 2023-05-10 MED ORDER — INSULIN ASPART 100 UNIT/ML IJ SOLN
0.0000 [IU] | Freq: Three times a day (TID) | INTRAMUSCULAR | Status: DC
  Administered 2023-05-11: 3 [IU] via SUBCUTANEOUS
  Administered 2023-05-12 – 2023-05-13 (×2): 2 [IU] via SUBCUTANEOUS
  Filled 2023-05-10 (×4): qty 1

## 2023-05-10 MED ORDER — INSULIN ASPART 100 UNIT/ML IJ SOLN
3.0000 [IU] | Freq: Three times a day (TID) | INTRAMUSCULAR | Status: DC
  Administered 2023-05-10 – 2023-05-13 (×7): 3 [IU] via SUBCUTANEOUS
  Filled 2023-05-10 (×5): qty 1

## 2023-05-10 MED ORDER — SODIUM CHLORIDE 0.9 % IV BOLUS
1000.0000 mL | Freq: Once | INTRAVENOUS | Status: AC
  Administered 2023-05-10: 1000 mL via INTRAVENOUS

## 2023-05-10 MED ORDER — SODIUM CHLORIDE 0.9 % IV SOLN: INTRAVENOUS | Status: DC

## 2023-05-10 NOTE — Progress Notes (Signed)
Received report about patient having generalized shivering, tachycardia at the bedside. Evaluated patient at the bedside. BP stable No hypoxia Suspect secondary to febrile reaction associated with UTI Will order stat EKG, lactate, LR bolus x 1 Daughter reports some worsening?  Cough Will check checks x-ray x 1 Will otherwise continue to monitor as appropriate.

## 2023-05-10 NOTE — Assessment & Plan Note (Signed)
Creatinine 2.2 today with baseline creatinine around 1.6-1.9 Clinically dry on exam IV fluid hydration Hold nephrotoxic agents in the short-term Follow

## 2023-05-10 NOTE — ED Provider Notes (Signed)
[facility] Provider Note    Event Date/Time   First MD Initiated Contact with Patient 05/10/23 [phone]     (approximate)   History   Altered Mental Status   HPI  Daniel Sherman is a 82 y.o. male   Past medical history of dementia, CAD, diabetes, GERD, hypertension hyperlipidemia, more confusion than usual w fever per EMS.   EMS reports that family found him with less energy than normal today, more confused than usual, refusing to get out of bed generalized weakness.  EMS febrile 101 F.  Had foul-smelling URINE in his depends diaper.  Family reports no other focal infectious symptoms like respiratory infectious symptoms or GI complaints. However given that he has lost weight recently months.  The patient has dementia and is disoriented for me not providing much of the history but following commands smiling pleasant appearing.  Independent Historian contributed to assessment above: EMS reports as above  External Medical Documents Reviewed: Outpatient visit internal medicine dated 05/01/2023 prior to follow-up visit, reviewed past medical history and current medications from this note .      Physical Exam   Triage Vital Signs: ED Triage Vitals  Enc Vitals Group     BP      Pulse      Resp      Temp      Temp src      SpO2      Weight      Height      Head Circumference      Peak Flow      Pain Score      Pain Loc      Pain Edu?      Excl. in GC?     Most recent vital signs: Vitals:   05/10/23 0829 05/10/23 0836  BP:  112/71  Pulse: 92   Resp: (!) 22   Temp: 100.1 F (37.8 C)   SpO2: 97%     General: Awake, no distress.  CV:  Good peripheral perfusion.  Resp:  Normal effort.  Abd:  No distention.  Other:  No focality to lung exam.  Abdomen soft nontender skin appears euvolemic vital signs significant for temperature of 100.1 Fahrenheit and respiratory rate of 22 otherwise normal.  Nontoxic pleasant gentleman.  He has no facial  asymmetry moving all extremities equally   ED Results / Procedures / Treatments   Labs (all labs ordered are listed, but only abnormal results are displayed) Labs Reviewed  COMPREHENSIVE METABOLIC PANEL - Abnormal; Notable for the following components:      Result Value   Glucose, Bld 156 (*)    BUN 38 (*)    Creatinine, Ser 2.21 (*)    Calcium 8.6 (*)    GFR, Estimated 29 (*)    All other components within normal limits  CBC WITH DIFFERENTIAL/PLATELET - Abnormal; Notable for the following components:   RBC 4.13 (*)    Hemoglobin 10.9 (*)    HCT 34.9 (*)    Platelets 75 (*)    Lymphs Abs 0.3 (*)    All other components within normal limits  URINALYSIS, W/ REFLEX TO CULTURE (INFECTION SUSPECTED) - Abnormal; Notable for the following components:   Color, Urine YELLOW (*)    APPearance TURBID (*)    Hgb urine dipstick SMALL (*)    Protein, ur 100 (*)    Leukocytes,Ua LARGE (*)    Bacteria, UA RARE (*)    All other components  within normal limits  SARS CORONAVIRUS 2 BY RT PCR  CULTURE, BLOOD (ROUTINE X 2)  CULTURE, BLOOD (ROUTINE X 2)  URINE CULTURE  LACTIC ACID, PLASMA  LACTIC ACID, PLASMA     I ordered and reviewed the above labs they are notable for bacteriuria and inflammatory changes in urine, AKI   EKG  ED ECG REPORT I, Pilar Jarvis, the attending physician, personally viewed and interpreted this ECG.   Date: 05/10/2023  EKG Time: 0833  Rate: 91  Rhythm: sinus  Axis: nl  Intervals:non  ST&T Change: no stemi    RADIOLOGY I independently reviewed and interpreted chest x-ray & see no obvious focality MiraLAX   PROCEDURES:  Critical Care performed: No  Procedures   MEDICATIONS ORDERED IN ED: Medications  sodium chloride 0.9 % bolus 1,000 mL (has no administration in time range)  cefTRIAXone (ROCEPHIN) 1 g in sodium chloride 0.9 % 100 mL IVPB (has no administration in time range)  acetaminophen (TYLENOL) tablet 650 mg (650 mg Oral Given 05/10/23  4098)    External physician / consultants:  I spoke with hospitalist for admission and regarding care plan for this patient.   IMPRESSION / MDM / ASSESSMENT AND PLAN / ED COURSE  I reviewed the triage vital signs and the nursing notes.                                Patient's presentation is most consistent with acute presentation with potential threat to life or bodily function.  Differential diagnosis includes, but is not limited to, urinary tract infection, respiratory infection sepsis Discussed dehydration   The patient is on the cardiac monitor to evaluate for evidence of arrhythmia and/or significant heart rate changes.  MDM: Baseline dementia here with fever and altered mental status concerning for infection, fortunately he looks nontoxic with relatively normal vital signs so we will start with basic labs chest x-ray urinalysis to elucidate the source of this infection   --- Workup reveals AKI and urinary tract infection, given IV crystalloid bolus and IV Rocephin for treatment.  Given his degree of generalized weakness and confusion and change from baseline dementia, living at home with his wife only, will be admitted for treatment         FINAL CLINICAL IMPRESSION(S) / ED DIAGNOSES   Final diagnoses:  AKI (acute kidney injury) (HCC)  Altered mental status, unspecified altered mental status type  Lower urinary tract infectious disease     Rx / DC Orders   ED Discharge Orders     None        Note:  This document was prepared using Dragon voice recognition software and may include unintentional dictation errors.    Pilar Jarvis, MD 05/10/23 1001

## 2023-05-10 NOTE — Assessment & Plan Note (Signed)
Worsening weakness, confusion lethargy with foul-smelling urine in the setting of baseline dementia Urinalysis indicative of infection Tmax 100.1 IV Rocephin Urine culture Follow

## 2023-05-10 NOTE — Assessment & Plan Note (Addendum)
Cell lines relatively stable today in review of prior lab work Trend for now Hold anticoagulation in the setting of platelet count in the 70s

## 2023-05-10 NOTE — Assessment & Plan Note (Signed)
SSI A1c 

## 2023-05-10 NOTE — Assessment & Plan Note (Signed)
Platelet count is 70s Hold anticoagulation Follow

## 2023-05-10 NOTE — ED Triage Notes (Signed)
Pt here via [facility] with [facility]MS. Pt has hx of dementia, more altered than normal per family. Pt has also been losing more weight than normal and has had a recent change to his bp meds. Last UTI was in [place] and he was admitted. Pt has a "ammonia" smelling urine. Pt had a fever with ems 101.4 axillary.   142-cbg 112/64 109/54 20G R hand 94% RA

## 2023-05-10 NOTE — Assessment & Plan Note (Signed)
BP stable Titrate home regimen 

## 2023-05-10 NOTE — Assessment & Plan Note (Signed)
Acute toxic metabolic encephalopathy in setting of UTI, dehydration, baseline dementia IV fluid hydration IV Rocephin Will otherwise monitor closely
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
## 2023-05-11 DIAGNOSIS — F0393 Unspecified dementia, unspecified severity, with mood disturbance: Secondary | ICD-10-CM | POA: Diagnosis present

## 2023-05-11 DIAGNOSIS — K219 Gastro-esophageal reflux disease without esophagitis: Secondary | ICD-10-CM | POA: Diagnosis present

## 2023-05-11 DIAGNOSIS — N179 Acute kidney failure, unspecified: Secondary | ICD-10-CM

## 2023-05-11 DIAGNOSIS — M6282 Rhabdomyolysis: Secondary | ICD-10-CM | POA: Diagnosis not present

## 2023-05-11 DIAGNOSIS — N1831 Chronic kidney disease, stage 3a: Secondary | ICD-10-CM | POA: Diagnosis present

## 2023-05-11 DIAGNOSIS — Z9049 Acquired absence of other specified parts of digestive tract: Secondary | ICD-10-CM | POA: Diagnosis not present

## 2023-05-11 DIAGNOSIS — D6949 Other primary thrombocytopenia: Secondary | ICD-10-CM | POA: Diagnosis present

## 2023-05-11 DIAGNOSIS — I129 Hypertensive chronic kidney disease with stage 1 through stage 4 chronic kidney disease, or unspecified chronic kidney disease: Secondary | ICD-10-CM | POA: Diagnosis present

## 2023-05-11 DIAGNOSIS — I252 Old myocardial infarction: Secondary | ICD-10-CM | POA: Diagnosis not present

## 2023-05-11 DIAGNOSIS — M1990 Unspecified osteoarthritis, unspecified site: Secondary | ICD-10-CM | POA: Diagnosis present

## 2023-05-11 DIAGNOSIS — D61818 Other pancytopenia: Secondary | ICD-10-CM | POA: Diagnosis present

## 2023-05-11 DIAGNOSIS — E785 Hyperlipidemia, unspecified: Secondary | ICD-10-CM | POA: Diagnosis present

## 2023-05-11 DIAGNOSIS — R4182 Altered mental status, unspecified: Secondary | ICD-10-CM | POA: Diagnosis present

## 2023-05-11 DIAGNOSIS — Z8249 Family history of ischemic heart disease and other diseases of the circulatory system: Secondary | ICD-10-CM | POA: Diagnosis not present

## 2023-05-11 DIAGNOSIS — I2510 Atherosclerotic heart disease of native coronary artery without angina pectoris: Secondary | ICD-10-CM | POA: Diagnosis present

## 2023-05-11 DIAGNOSIS — F32A Depression, unspecified: Secondary | ICD-10-CM | POA: Diagnosis present

## 2023-05-11 DIAGNOSIS — E860 Dehydration: Secondary | ICD-10-CM | POA: Diagnosis present

## 2023-05-11 DIAGNOSIS — Z1152 Encounter for screening for COVID-19: Secondary | ICD-10-CM | POA: Diagnosis not present

## 2023-05-11 DIAGNOSIS — G928 Other toxic encephalopathy: Secondary | ICD-10-CM | POA: Diagnosis present

## 2023-05-11 DIAGNOSIS — Z96642 Presence of left artificial hip joint: Secondary | ICD-10-CM | POA: Diagnosis present

## 2023-05-11 DIAGNOSIS — Z87891 Personal history of nicotine dependence: Secondary | ICD-10-CM | POA: Diagnosis not present

## 2023-05-11 DIAGNOSIS — N390 Urinary tract infection, site not specified: Principal | ICD-10-CM

## 2023-05-11 DIAGNOSIS — Z7984 Long term (current) use of oral hypoglycemic drugs: Secondary | ICD-10-CM | POA: Diagnosis not present

## 2023-05-11 DIAGNOSIS — E1122 Type 2 diabetes mellitus with diabetic chronic kidney disease: Secondary | ICD-10-CM | POA: Diagnosis present

## 2023-05-11 DIAGNOSIS — Z8616 Personal history of COVID-19: Secondary | ICD-10-CM | POA: Diagnosis not present

## 2023-05-11 DIAGNOSIS — Z85038 Personal history of other malignant neoplasm of large intestine: Secondary | ICD-10-CM | POA: Diagnosis not present

## 2023-05-11 LAB — CBC
HCT: 34.1 % — ABNORMAL LOW (ref 39.0–52.0)
Hemoglobin: 10.8 g/dL — ABNORMAL LOW (ref 13.0–17.0)
MCH: 27 pg (ref 26.0–34.0)
MCHC: 31.7 g/dL (ref 30.0–36.0)
MCV: 85.3 fL (ref 80.0–100.0)
Platelets: 60 10*3/uL — ABNORMAL LOW (ref 150–400)
RBC: 4 MIL/uL — ABNORMAL LOW (ref 4.22–5.81)
RDW: 14.4 % (ref 11.5–15.5)
WBC: 5.1 10*3/uL (ref 4.0–10.5)
nRBC: 0 % (ref 0.0–0.2)

## 2023-05-11 LAB — CULTURE, BLOOD (ROUTINE X 2): Special Requests: ADEQUATE

## 2023-05-11 LAB — COMPREHENSIVE METABOLIC PANEL
ALT: 12 U/L (ref 0–44)
AST: 39 U/L (ref 15–41)
Albumin: 3.3 g/dL — ABNORMAL LOW (ref 3.5–5.0)
Alkaline Phosphatase: 51 U/L (ref 38–126)
Anion gap: 10 (ref 5–15)
BUN: 36 mg/dL — ABNORMAL HIGH (ref 8–23)
CO2: 18 mmol/L — ABNORMAL LOW (ref 22–32)
Calcium: 8.3 mg/dL — ABNORMAL LOW (ref 8.9–10.3)
Chloride: 108 mmol/L (ref 98–111)
Creatinine, Ser: 2.08 mg/dL — ABNORMAL HIGH (ref 0.61–1.24)
GFR, Estimated: 31 mL/min — ABNORMAL LOW (ref >60)
Glucose, Bld: 118 mg/dL — ABNORMAL HIGH (ref 70–99)
Potassium: 4.5 mmol/L (ref 3.5–5.1)
Sodium: 136 mmol/L (ref 135–145)
Total Bilirubin: 1.1 mg/dL (ref 0.3–1.2)
Total Protein: 6.7 g/dL (ref 6.5–8.1)

## 2023-05-11 LAB — GLUCOSE, CAPILLARY
Glucose-Capillary: 115 mg/dL — ABNORMAL HIGH (ref 70–99)
Glucose-Capillary: 91 mg/dL (ref 70–99)
Glucose-Capillary: 143 mg/dL — ABNORMAL HIGH (ref 70–99)
Glucose-Capillary: 102 mg/dL — ABNORMAL HIGH (ref 70–99)

## 2023-05-11 MED ORDER — COLESTIPOL HCL 5 G PO PACK
1.0000 g | PACK | Freq: Two times a day (BID) | ORAL | Status: DC
  Administered 2023-05-11 – 2023-05-13 (×5): 1 g via ORAL
  Filled 2023-05-11 (×6): qty 1

## 2023-05-11 MED ORDER — ACETAMINOPHEN 10 MG/ML IV SOLN
1000.0000 mg | Freq: Once | INTRAVENOUS | Status: AC
  Administered 2023-05-11: 1000 mg via INTRAVENOUS
  Filled 2023-05-11: qty 100

## 2023-05-11 NOTE — TOC Initial Note (Signed)
Transition of Care [facility]) - Initial/Assessment Note    Patient Details  Name: Daniel Sherman MRN: [id number] Date of Birth: 27-Sep-1941  Transition of [facility]) CM/SW Contact:    Bing Quarry, RN Phone Number: 05/11/2023, 4:16 PM  Clinical Narrative:  6/8: New admit via [facility] with AMS, Htx of dementia, recent weight loss, foul urine, htx of colon cancer,  pancreatic tumor, Dx of UTI. DIL and son help patient with spouse that live in smaller mobile home on property/down the street. Spoke with son, [name].  No issues with medications or transportation. Spouse care takes but getting harder, needs PCS services list. Has PCP. No HH preference though has used [facility] in 2022. Spoke with son, [name], they would like to care for patient at home as he needs more supervision than therapy he says.  PT recommends HH with HH aide. Also wants to wait to see if improves or gets worse as has been declining recently. TOC to follow. Gabriel Cirri RN CM (Weekends only [phone]).               Expected Discharge Plan: Home w Home Health Services Barriers to Discharge: Continued Medical Work up   Patient Goals and CMS Choice Patient states their goals for this hospitalization and ongoing recovery are:: Go home.          Expected Discharge Plan and Services   Discharge Planning Services: CM Consult   Living arrangements for the past 2 months: Mobile Home (Near son with spouse.)                                      Prior Living Arrangements/Services Living arrangements for the past 2 months: Mobile Home (Near son with spouse.) Lives with:: Spouse Patient language and need for interpreter reviewed:: No        Need for Family Participation in Patient Care: Yes (Comment) Care giver support system in place?: Yes (comment)   Criminal Activity/Legal Involvement Pertinent to Current Situation/Hospitalization: No - Comment as needed  Activities of Daily Living Home Assistive Devices/Equipment:  Grab bars in shower ADL Screening (condition at time of admission) Patient's cognitive ability adequate to safely complete daily activities?: No Is the patient deaf or have difficulty hearing?: Yes Does the patient have difficulty seeing, even when wearing glasses/contacts?: No Does the patient have difficulty concentrating, remembering, or making decisions?: Yes Patient able to express need for assistance with ADLs?: No Does the patient have difficulty dressing or bathing?: Yes Independently performs ADLs?: No Communication: Independent Dressing (OT): Needs assistance Is this a change from baseline?: Pre-admission baseline Grooming: Needs assistance Is this a change from baseline?: Pre-admission baseline Feeding: Independent with device (comment) Bathing: Needs assistance Is this a change from baseline?: Pre-admission baseline Toileting: Needs assistance Is this a change from baseline?: Pre-admission baseline In/Out Bed: Needs assistance Is this a change from baseline?: Pre-admission baseline Walks in Home: Independent Does the patient have difficulty walking or climbing stairs?: Yes (today he couldn't get out of bed) Weakness of Legs: Both Weakness of Arms/Hands: Left (new event today)  Permission Sought/Granted   Permission granted to share information with : Yes, Verbal Permission Granted     Permission granted to share info w AGENCY: HH agencies when needed for obtaining services.        Emotional Assessment              Admission  diagnosis:  Lower urinary tract infectious disease [N39.0] UTI (urinary tract infection) [N39.0] AKI (acute kidney injury) (HCC) [N17.9] Altered mental status, unspecified altered mental status type [R41.82] Patient Active Problem List   Diagnosis Date Noted   UTI (urinary tract infection) 05/10/2023   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   Thyroid nodule 01/18/2023   Pancytopenia (HCC) 11/14/2022   Sepsis (HCC) 11/13/2022    Dementia without behavioral disturbance (HCC) 11/13/2022   Primary hypertension 11/13/2022   Encephalopathy 11/12/2022   Acute metabolic encephalopathy 11/11/2022   Acute renal failure superimposed on stage 3a chronic kidney disease (HCC) 11/11/2022   Urinary tract infection 11/11/2022   Coronary atherosclerosis of native coronary artery 11/11/2022   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   Late onset Alzheimer's disease without behavioral disturbance (HCC) 01/13/2022   Gastroenteritis 06/17/2021   Abdominal pain    Nausea    Calculus of gallbladder with acute cholecystitis without obstruction    Goals of care, counseling/discussion 04/03/2020   IDA (iron deficiency anemia) 02/22/2020   Colon cancer s/p right colectomy (HCC) 08/12/2019   Hepatitis    Severe sepsis (HCC)    SIRS (systemic inflammatory response syndrome) (HCC) 12/09/2016   Abnormal LFTs 12/09/2016   Acute viral syndrome 12/09/2016   S/P left THA, 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   Obese 02/16/2015   KNEE PAIN, LEFT 10/21/2008   Diabetes mellitus without complication (HCC) 06/04/2007   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   Primary thrombocytopenia (HCC) 06/04/2007   Essential hypertension 06/04/2007   PCP:  Jerl Mina, MD Pharmacy:   [facility] - [place], [place] - [address] [place] [address] Phone: [phone] Fax: [phone]  [facility] [address] [place] - [address] [place] [address] Phone: [phone] Fax: [phone]  [id number] - [place], [place] - [address] AT [facility] [address] [facility] [address] [place] [address] Phone: [phone] Fax: [phone]     Social Determinants of Health (SDOH) Social History: SDOH Screenings   Food Insecurity: No Food Insecurity (05/10/2023)  Housing: Low Risk  (05/10/2023)  Transportation Needs: No Transportation Needs (05/10/2023)   Utilities: Not At Risk (05/10/2023)  Alcohol Screen: Low Risk  (04/19/2023)  Depression (PHQ2-9): Low Risk  (04/19/2023)  Financial Resource Strain: Medium Risk (04/19/2023)  Physical Activity: Inactive (04/19/2023)  Social Connections: Moderately Isolated (04/19/2023)  Stress: Stress Concern Present (04/19/2023)  Tobacco Use: Medium Risk (03/09/2023)   SDOH Interventions:     Readmission Risk Interventions    11/14/2022   12:51 PM  Readmission Risk Prevention Plan  Transportation Screening Complete  PCP or Specialist Appt within 3-5 Days Complete  HRI or Home Care Consult Complete  Social Work Consult for Recovery Care Planning/Counseling Complete  Palliative Care Screening Not Applicable  Medication Review Oceanographer) Referral to Pharmacy

## 2023-05-11 NOTE — Evaluation (Signed)
Occupational Therapy Evaluation Patient Details Name: Daniel Sherman MRN: [id number] DOB: 04/27/1941 Today's Date: 05/11/2023   History of Present Illness Pt admitted to [facility] on 05/10/23 under observation for c/o AMS, generalized weakness, and fever. Admitted for treatment of UTI, acute on chronic CKD (III), and encephalopathy. Significant PMH includes: dementia, T2DM, HTN, colon CA s/p R hemicolectomy, primary pancreatic neuroendocrine tumor, CAD, chronic thrombocytopenia, CKD (III).   Clinical Impression   Patient presenting with decreased Ind in self care,balance, functional mobility/transfers, endurance, and safety awareness. Patient is oriented to self only. Daughter in law present in room reports that pt's wife provides assist  for ADLs secondary to cognition. He does ambulate at home or "follow her around" per family. Patient having recently returned to bed and is very resistive to therapist assisting him. Attempts to wash face with pt not following any commands and with hand over hand he is resistive and pushing away. OT placing cloth on his face and he quickly removed it and scowls at therapist. Max A for bed mobility to reposition. He has posey happy hands overlay in bed with him that family reports he likes. Discussion with family about pt returning to familiar environment with increased support.  Patient will benefit from acute OT to increase overall independence in the areas of ADLs, functional mobility, and safety awareness in order to safely discharge.     Recommendations for follow up therapy are one component of a multi-disciplinary discharge planning process, led by the attending physician.  Recommendations may be updated based on patient status, additional functional criteria and insurance authorization.   Assistance Recommended at Discharge Frequent or constant Supervision/Assistance  Patient can return home with the following A lot of help with walking and/or transfers;A lot of help  with bathing/dressing/bathroom;Assistance with cooking/housework;Assist for transportation;Help with stairs or ramp for entrance;Direct supervision/assist for medications management;Direct supervision/assist for financial management    Functional Status Assessment  Patient has had a recent decline in their functional status and demonstrates the ability to make significant improvements in function in a reasonable and predictable amount of time.  Equipment Recommendations  None recommended by OT       Precautions / Restrictions Precautions Precautions: Fall Restrictions Weight Bearing Restrictions: No             ADL either performed or assessed with clinical judgement   ADL Overall ADL's : Needs assistance/impaired                                       General ADL Comments: attempts for hand over hand assistance with pt being resistive to movement. OT placing washcloth over his face and he instantly rips from face and looks upset.     Vision Patient Visual Report: No change from baseline              Pertinent Vitals/Pain Pain Assessment Pain Assessment: Faces Faces Pain Scale: No hurt     Hand Dominance Right   Extremity/Trunk Assessment Upper Extremity Assessment Upper Extremity Assessment: Difficult to assess due to impaired cognition;Overall [facility] for tasks assessed   Lower Extremity Assessment Lower Extremity Assessment: Difficult to assess due to impaired cognition;Overall WFL for tasks assessed       Communication Communication Communication: HOH   Cognition Arousal/Alertness: Awake/alert Behavior During Therapy: Anxious, Restless Overall Cognitive Status: History of cognitive impairments - at baseline  General Comments: baseline dementia and unable to follow commands during session.     General Comments  Assisted in pericare and linen change            Home Living Family/patient  expects to be discharged to:: Private residence Living Arrangements: Spouse/significant other Available Help at Discharge: Family;Available 24 hours/day Type of Home: Mobile home Home Access: Ramped entrance     Home Layout: One level     Bathroom Shower/Tub: Arts development officer Toilet: Handicapped height     Home Equipment: Agricultural consultant (2 wheels);Tub bench;BSC/3in1;Shower seat          Prior Functioning/Environment Prior Level of Function : Needs assist  Cognitive Assist : Mobility (cognitive);ADLs (cognitive) Mobility (Cognitive): Step by step cues         Mobility Comments: DIL reports that pt does not require assist for bed mobility, transfers, or gait. Pt does not use AD for ambulation. ADLs Comments: Per family report, his wife assists with self care needs secondary to cognition.        OT Problem List: Decreased strength;Decreased activity tolerance;Decreased safety awareness;Impaired balance (sitting and/or standing);Decreased knowledge of use of DME or AE;Decreased cognition      OT Treatment/Interventions: Self-care/ADL training;Therapeutic exercise;Therapeutic activities;DME and/or AE instruction;Patient/family education;Balance training;Energy conservation    OT Goals(Current goals can be found in the care plan section) Acute Rehab OT Goals Patient Stated Goal: "to go home with support" OT Goal Formulation: With family Time For Goal Achievement: 05/25/23 Potential to Achieve Goals: Fair ADL Goals Pt Will Transfer to Toilet: with min guard assist;ambulating Pt Will Perform Toileting - Clothing Manipulation and hygiene: with min assist;sit to/from stand  OT Frequency: Min 1X/week       AM-PAC OT "6 Clicks" Daily Activity     Outcome Measure Help from another person eating meals?: A Lot Help from another person taking care of personal grooming?: A Lot Help from another person toileting, which includes using toliet, bedpan, or urinal?: A  Lot Help from another person bathing (including washing, rinsing, drying)?: A Lot Help from another person to put on and taking off regular upper body clothing?: A Lot Help from another person to put on and taking off regular lower body clothing?: A Lot 6 Click Score: 12   End of Session Nurse Communication: Mobility status  Activity Tolerance: Patient limited by fatigue Patient left: in bed;with call bell/phone within reach;with bed alarm set;with family/visitor present  OT Visit Diagnosis: Unsteadiness on feet (R26.81);Repeated falls (R29.6);Muscle weakness (generalized) (M62.81)                Time: 3086-5784 OT Time Calculation (min): 12 min Charges:  OT General Charges $OT Visit: 1 Visit OT Evaluation $OT Eval Low Complexity: 1 Low Jackquline Denmark, MS, OTR/L , CBIS ascom [phone]  05/11/23, 2:24 PM

## 2023-05-11 NOTE — Progress Notes (Signed)
PROGRESS NOTE    Daniel Sherman  NWG:[id number] DOB: 19-Jan-1941 DOA: 05/10/2023 PCP: Jerl Mina, MD    Assessment & Plan:   Principal Problem:   UTI (urinary tract infection) Active Problems:   Acute renal failure superimposed on stage 3a chronic kidney disease (HCC)   Encephalopathy   Primary thrombocytopenia (HCC)   Diabetes mellitus without complication (HCC)   Essential hypertension   Pancytopenia (HCC)  Assessment and Plan: UTI: urine cx was not done. Continue on IV rocephin   Acute toxic metabolic encephalopathy: likely secondary to UTI, dementia. Continue on IVFs and IV abxs    AKI on CKDIIIa: baseline Cr around 1.6-1.9. Cr is trending down from day prior. Continue on IVFs.   Possible rhabdomyolysis: CK is elevated. Likely contributing to AKI on CKD    Primary thrombocytopenia: etiology unclear. Will continue to monitor    Normocytic anemia: H&H are stable. Will transfuse if Hb < 7.0    HTN: holding home dose of lisinopril secondary to AKI on CKD    DM2: well controlled, HbA1c 6.1 Continue on SSI w/ accuchecks   Depression: continue on home dose of lexapro   Dementia: continue on home dose of namenda. Continue w/ supportive care   DVT prophylaxis: SCDs Code Status:  full  Family Communication: discussed pt's care w/ pt's family at bedside and answered her questions  Disposition Plan: likely d/c back home   Level of care: Med-Surg  Status is: Observation The patient remains OBS appropriate and will d/c before 2 midnights.    Consultants:    Procedures:   Antimicrobials: rocephin    Subjective: Pt is pleasantly confused   Objective: Vitals:   05/10/23 2224 05/11/23 0058 05/11/23 0158 05/11/23 0554  BP: 136/78 (!) 107/45 (!) 94/58 (!) 118/55  Pulse: (!) 103 92 85 79  Resp: 20 20 19 18   Temp: (!) 100.4 F (38 C) (!) 103.1 F (39.5 C) (!) 100.5 F (38.1 C) 98.1 F (36.7 C)  TempSrc:   Oral Oral  SpO2: 95% 92% 93% 100%  Weight: 87.4 kg      Height: 5\' 5"  (1.651 m)       Intake/Output Summary (Last 24 hours) at 05/11/2023 0803 Last data filed at 05/11/2023 5784 Gross per 24 hour  Intake 3100 ml  Output 200 ml  Net 2900 ml   Filed Weights   05/10/23 0835 05/10/23 2224  Weight: 76 kg 87.4 kg    Examination:  General exam: Appears calm and comfortable  Respiratory system: Clear to auscultation. Respiratory effort normal. Cardiovascular system: S1 & S2+. No rubs, gallops or clicks.  Gastrointestinal system: Abdomen is nondistended, soft and nontender. Normal bowel sounds heard. Central nervous system: Alert and awake. Moves all extremities  Psychiatry: Judgement and insight appears poor. Flat mood and affect     Data Reviewed: I have personally reviewed following labs and imaging studies  CBC: Recent Labs  Lab 05/10/23 0849 05/11/23 0631  WBC 6.9 5.1  NEUTROABS 6.3  --   HGB 10.9* 10.8*  HCT 34.9* 34.1*  MCV 84.5 85.3  PLT 75* 60*   Basic Metabolic Panel: Recent Labs  Lab 05/10/23 0849 05/11/23 0631  NA 138 136  K 5.0 4.5  CL 105 108  CO2 23 18*  GLUCOSE 156* 118*  BUN 38* 36*  CREATININE 2.21* 2.08*  CALCIUM 8.6* 8.3*   GFR: Estimated Creatinine Clearance: 27.8 mL/min (A) (by C-G formula based on SCr of 2.08 mg/dL (H)). Liver Function Tests: Recent Labs  Lab 05/10/23 0849 05/11/23 0631  AST 15 39  ALT 10 12  ALKPHOS 66 51  BILITOT 1.2 1.1  PROT 7.0 6.7  ALBUMIN 3.8 3.3*   No results for input(s): "LIPASE", "AMYLASE" in the last 168 hours. No results for input(s): "AMMONIA" in the last 168 hours. Coagulation Profile: No results for input(s): "INR", "PROTIME" in the last 168 hours. Cardiac Enzymes: Recent Labs  Lab 05/10/23 1941  CKTOTAL 1,114*   BNP (last 3 results) No results for input(s): "PROBNP" in the last 8760 hours. HbA1C: No results for input(s): "HGBA1C" in the last 72 hours. CBG: Recent Labs  Lab 05/10/23 1308 05/10/23 1837 05/10/23 2003  GLUCAP 116* 100* 108*    Lipid Profile: No results for input(s): "CHOL", "HDL", "LDLCALC", "TRIG", "CHOLHDL", "LDLDIRECT" in the last 72 hours. Thyroid Function Tests: No results for input(s): "TSH", "T4TOTAL", "FREET4", "T3FREE", "THYROIDAB" in the last 72 hours. Anemia Panel: No results for input(s): "VITAMINB12", "FOLATE", "FERRITIN", "TIBC", "IRON", "RETICCTPCT" in the last 72 hours. Sepsis Labs: Recent Labs  Lab 05/10/23 0849 05/10/23 1021 05/10/23 1941  LATICACIDVEN 1.2 0.9 1.5    Recent Results (from the past 240 hour(s))  SARS Coronavirus 2 by RT PCR (hospital order, performed in [facility] hospital lab) *cepheid single result test* Urine, Clean Catch     Status: None   Collection Time: 05/10/23  8:49 AM   Specimen: Urine, Clean Catch; Nasal Swab  Result Value Ref Range Status   SARS Coronavirus 2 by RT PCR NEGATIVE NEGATIVE Final    Comment: (NOTE) SARS-CoV-2 target nucleic acids are NOT DETECTED.  The SARS-CoV-2 RNA is generally detectable in upper and lower respiratory specimens during the acute phase of infection. The lowest concentration of SARS-CoV-2 viral copies this assay can detect is 250 copies / mL. A negative result does not preclude SARS-CoV-2 infection and should not be used as the sole basis for treatment or other patient management decisions.  A negative result may occur with improper specimen collection / handling, submission of specimen other than nasopharyngeal swab, presence of viral mutation(s) within the areas targeted by this assay, and inadequate number of viral copies (<250 copies / mL). A negative result must be combined with clinical observations, patient history, and epidemiological information.  Fact Sheet for Patients:   RoadLapTop.co.za  Fact Sheet for Healthcare Providers: http://kim-miller.com/  This test is not yet approved or  cleared by the [place] FDA and has been authorized for detection and/or  diagnosis of SARS-CoV-2 by FDA under an Emergency Use Authorization (EUA).  This EUA will remain in effect (meaning this test can be used) for the duration of the COVID-19 declaration under Section 564(b)(1) of the Act, 21 U.S.C. section 360bbb-3(b)(1), unless the authorization is terminated or revoked sooner.  Performed at [facility], [address] [place], [place] [address]   Blood Culture (routine x 2)     Status: None (Preliminary result)   Collection Time: 05/10/23  8:59 AM   Specimen: BLOOD RIGHT HAND  Result Value Ref Range Status   Specimen Description BLOOD RIGHT HAND  Final   Special Requests   Final    BOTTLES DRAWN AEROBIC AND ANAEROBIC Blood Culture adequate volume   Culture   Final    NO GROWTH < 24 HOURS Performed at [facility], [address] [place], [place] [address]    Report Status PENDING  Incomplete  Blood Culture (routine x 2)     Status: None (Preliminary result)   Collection  Time: 05/10/23  8:59 AM   Specimen: BLOOD  Result Value Ref Range Status   Specimen Description BLOOD LEFT ANTECUBITAL  Final   Special Requests   Final    BOTTLES DRAWN AEROBIC AND ANAEROBIC Blood Culture results may not be optimal due to an excessive volume of blood received in culture bottles   Culture   Final    NO GROWTH < 24 HOURS Performed at [facility], [address], [place], [place] [address]    Report Status PENDING  Incomplete         Radiology Studies: DG Chest Port 1 View  Result Date: 05/10/2023 CLINICAL DATA:  Shortness of breath. EXAM: PORTABLE CHEST 1 VIEW COMPARISON:  May 10, 2023. FINDINGS: The heart size and mediastinal contours are within normal limits. Both lungs are clear. The visualized skeletal structures are unremarkable. IMPRESSION: No active disease. Electronically Signed   By: Lupita Raider M.D.   On: 05/10/2023 13:09   DG Chest Port 1 View  Result Date: 05/10/2023 CLINICAL DATA:  Questionable sepsis EXAM:  PORTABLE CHEST 1 VIEW COMPARISON:  11/11/2022 FINDINGS: Low volume chest with indistinct density at the lung bases. No edema, effusion, or pneumothorax. Normal heart size and mediastinal contours. IMPRESSION: Low volume chest with atelectasis or infiltrate in the lower lungs. Electronically Signed   By: Tiburcio Pea M.D.   On: 05/10/2023 08:50        Scheduled Meds:  colestipol  1 g Oral BID   donepezil  10 mg Oral Daily   escitalopram  5 mg Oral Daily   insulin aspart  0-15 Units Subcutaneous TID WC   insulin aspart  3 Units Subcutaneous TID WC   memantine  10 mg Oral BID   Continuous Infusions:  sodium chloride 125 mL/hr at 05/11/23 0639   cefTRIAXone (ROCEPHIN)  IV       LOS: 0 days    Time spent: 35 mins     Charise Killian, MD [facility] Pager [phone]  If 7PM-7AM, please contact night-coverage [email or web address] 05/11/2023, 8:03 AM

## 2023-05-11 NOTE — Evaluation (Signed)
Physical Therapy Evaluation Patient Details Name: Daniel Sherman MRN: [id number] DOB: [birth year] Today's Date: 05/11/2023  History of Present Illness  Pt admitted to [facility] on 05/10/23 under observation for c/o AMS, generalized weakness, and fever. Admitted for treatment of UTI, acute on chronic CKD (III), and encephalopathy. Significant PMH includes: dementia, T2DM, HTN, colon CA s/p R hemicolectomy, primary pancreatic neuroendocrine tumor, CAD, chronic thrombocytopenia, CKD (III).   Clinical Impression  Pt is a 82 year old M admitted to hospital on 05/10/23 for UTI. PLOF and home set up confirmed by DIL, Morrie Sheldon, at bedside due to pt baseline dementia. At baseline, pt is mod I with functional mobility without AD and requires set up assist for ADL's secondary to cognition. Spouse/family assist with transportation, IADL's, and medication management.   Pt presents with confusion (more than baseline), difficulty following commands, functional weakness secondary to cognition, decreased standing balance, and decreased activity tolerance, resulting in impaired functional mobility from baseline. Due to deficits, pt required CGA-total assist for bed mobility, min-mod assist for transfers, and min assist to ambulate ~33ft during session. Pt currently requiring increased assist and cueing secondary to decreased cognitive function from baseline, resulting in fluctuating assist levels during session.  Deficits limit the pt's ability to safely and independently perform ADL's, transfer, and ambulate. Pt will benefit from acute skilled PT services to address deficits for return to baseline function. At this time, PT recommends return home with continued therapy services. DIL agreeable. Will also recommend HH aide to assist with ADL's for reduced caregiver burden.  Seeing as how cognitive function can influence functional mobility, will also recommend lightweight WC at DC as movement becomes increasingly difficult to manage  with decreased cognitive function. DIL states that pt and spouse live in a smaller mobile home, and would need a smaller WC (20-22").        Recommendations for follow up therapy are one component of a multi-disciplinary discharge planning process, led by the attending physician.  Recommendations may be updated based on patient status, additional functional criteria and insurance authorization.  Follow Up Recommendations       Assistance Recommended at Discharge  (frequent/constant supervision secondary to baseline cognitive deficit)  Patient can return home with the following  A little help with bathing/dressing/bathroom;Assistance with cooking/housework;Help with stairs or ramp for entrance;Assist for transportation;Direct supervision/assist for medications management    Equipment Recommendations Wheelchair (measurements PT)     Functional Status Assessment Patient has had a recent decline in their functional status and demonstrates the ability to make significant improvements in function in a reasonable and predictable amount of time.     Precautions / Restrictions Precautions Precautions: Fall Restrictions Weight Bearing Restrictions: No      Mobility  Bed Mobility         Supine to sit: Min guard Sit to supine: Total assist   General bed mobility comments: CGA for safety to sit EOB due to posterior lean and poor proximal stability. Total assist to lie supine secondary to cognition.    Transfers       Sit to Stand: Min assist           General transfer comment: min assist for power to stand from EOB x1 and recliner x1 with RW. Mod assist to sit secondary to cognition. Mod assist for stand-step pivot transfer due to cognition.    Ambulation/Gait   Gait Distance (Feet): 4 Feet (35ft x2)           General Gait Details:  Able to take a few lateral steps at EOB without AD, with min assist from therapist. Able to take a few steps from recliner>EOB with RW with  CGA. Increased multimodal cueing required secondary to cognition.      Balance Overall balance assessment: Needs assistance Sitting-balance support: Feet supported, Bilateral upper extremity supported Sitting balance-Leahy Scale: Good Sitting balance - Comments: initially fair when attempting to sit EOB; progressed to good once seated EOB with UE/LE support       Standing balance comment: overall pt with good standing balance; intermittent episodes of "Fair" secondary to cognition. standing balance assessed both with and without AD                             Pertinent Vitals/Pain Pain Assessment Pain Assessment: Faces Faces Pain Scale: No hurt    Home Living Family/patient expects to be discharged to:: Private residence Living Arrangements: Spouse/significant other Available Help at Discharge: Family;Available 24 hours/day Type of Home: Mobile home Home Access: Ramped entrance       Home Layout: One level Home Equipment: Agricultural consultant (2 wheels);Tub bench;BSC/3in1      Prior Function               Mobility Comments: DIL reports that pt does not require assist for bed mobility, transfers, or gait. Pt does not use AD for ambulation. ADLs Comments: Requires set up assist secondary to cognition; spouse assists with medication and IADL's     Hand Dominance   Dominant Hand: Right    Extremity/Trunk Assessment   Upper Extremity Assessment Upper Extremity Assessment: Overall WFL for tasks assessed (not formally assessed due to difficulty following commands; observed to be at least 3+/5 as pt was able to WB through UE for transfers without buckling)    Lower Extremity Assessment Lower Extremity Assessment: Overall WFL for tasks assessed (not formally assessed due to difficulty following commands; observed to be at least 3+/5 as pt was able to WB through LE for transfers without buckling)       Communication   Communication: HOH  Cognition  Arousal/Alertness: Awake/alert Behavior During Therapy: Anxious, Restless Overall Cognitive Status: History of cognitive impairments - at baseline                                 General Comments: baseline dementia; able to confirm name but unable to answer any other A&O questions. DIL states that current cognitive state is not his baseline, and is a little worse since hospital admission.        General Comments General comments (skin integrity, edema, etc.): Assisted in pericare and linen change    Exercises Other Exercises Other Exercises: Participates in: bed mobility, transfers, minimal gait, and pericare. Increased assist and cueing required secondary to cognition. Other Exercises: Pt and DIL educated re: PT role/POC, DC recommendations, safety with functional mobility.   Assessment/Plan    PT Assessment Patient needs continued PT services  PT Problem List Decreased strength;Decreased activity tolerance;Decreased balance;Decreased mobility;Decreased safety awareness;Decreased cognition       PT Treatment Interventions DME instruction;Gait training;Functional mobility training;Therapeutic activities;Therapeutic exercise;Balance training;Neuromuscular re-education    PT Goals (Current goals can be found in the Care Plan section)  Acute Rehab PT Goals Patient Stated Goal: "return home" PT Goal Formulation: With family Time For Goal Achievement: 05/25/23 Potential to Achieve Goals: Good    Frequency Min  2X/week        AM-PAC PT "6 Clicks" Mobility  Outcome Measure Help needed turning from your back to your side while in a flat bed without using bedrails?: A Little Help needed moving from lying on your back to sitting on the side of a flat bed without using bedrails?: A Little Help needed moving to and from a bed to a chair (including a wheelchair)?: A Little Help needed standing up from a chair using your arms (e.g., wheelchair or bedside chair)?: A  Little Help needed to walk in hospital room?: A Little Help needed climbing 3-5 steps with a railing? : A Little 6 Click Score: 18    End of Session Equipment Utilized During Treatment: Gait belt Activity Tolerance: Patient tolerated treatment well Patient left: in bed;with bed alarm set;with call bell/phone within reach;with family/visitor present Nurse Communication: Mobility status PT Visit Diagnosis: Unsteadiness on feet (R26.81);Difficulty in walking, not elsewhere classified (R26.2)    Time: 1610-9604 PT Time Calculation (min) (ACUTE ONLY): 28 min   Charges:   PT Evaluation $PT Eval Low Complexity: 1 Low PT Treatments $Therapeutic Activity: 8-22 mins        Vira Blanco, PT, DPT 1:41 PM,05/11/23 Physical Therapist - [facility]

## 2023-05-12 DIAGNOSIS — N390 Urinary tract infection, site not specified: Secondary | ICD-10-CM | POA: Diagnosis not present

## 2023-05-12 LAB — CBC
HCT: 27 % — ABNORMAL LOW (ref 39.0–52.0)
Hemoglobin: 8.7 g/dL — ABNORMAL LOW (ref 13.0–17.0)
MCH: 26.9 pg (ref 26.0–34.0)
MCHC: 32.2 g/dL (ref 30.0–36.0)
MCV: 83.3 fL (ref 80.0–100.0)
Platelets: 55 10*3/uL — ABNORMAL LOW (ref 150–400)
RBC: 3.24 MIL/uL — ABNORMAL LOW (ref 4.22–5.81)
RDW: 14.2 % (ref 11.5–15.5)
WBC: 2.1 10*3/uL — ABNORMAL LOW (ref 4.0–10.5)
nRBC: 0 % (ref 0.0–0.2)

## 2023-05-12 LAB — BASIC METABOLIC PANEL
Anion gap: 8 (ref 5–15)
BUN: 39 mg/dL — ABNORMAL HIGH (ref 8–23)
CO2: 19 mmol/L — ABNORMAL LOW (ref 22–32)
Calcium: 7.4 mg/dL — ABNORMAL LOW (ref 8.9–10.3)
Chloride: 110 mmol/L (ref 98–111)
Creatinine, Ser: 1.85 mg/dL — ABNORMAL HIGH (ref 0.61–1.24)
GFR, Estimated: 36 mL/min — ABNORMAL LOW (ref >60)
Glucose, Bld: 106 mg/dL — ABNORMAL HIGH (ref 70–99)
Potassium: 3.9 mmol/L (ref 3.5–5.1)
Sodium: 137 mmol/L (ref 135–145)

## 2023-05-12 LAB — CK: Total CK: 364 U/L (ref 49–397)

## 2023-05-12 LAB — GLUCOSE, CAPILLARY
Glucose-Capillary: 101 mg/dL — ABNORMAL HIGH (ref 70–99)
Glucose-Capillary: 124 mg/dL — ABNORMAL HIGH (ref 70–99)
Glucose-Capillary: 89 mg/dL (ref 70–99)
Glucose-Capillary: 105 mg/dL — ABNORMAL HIGH (ref 70–99)

## 2023-05-12 LAB — CULTURE, BLOOD (ROUTINE X 2)

## 2023-05-12 MED ORDER — ESCITALOPRAM OXALATE 10 MG PO TABS
5.0000 mg | ORAL_TABLET | Freq: Two times a day (BID) | ORAL | Status: DC
  Administered 2023-05-12 – 2023-05-13 (×2): 5 mg via ORAL
  Filled 2023-05-12 (×2): qty 1

## 2023-05-12 NOTE — Progress Notes (Signed)
PROGRESS NOTE   HPI was taken from Dr. Alvester Morin: Daniel Sherman is a 82 y.o. male with medical history significant of  type 2 diabetes mellitus, hypertension, Colon cancer status post right hemicolectomy, primary pancreatic neuroendocrine tumor, CAD, chronic thrombocytopenia, HTN, stage 3 CKD, dementia presenting w/ UTI, acute on chronic stage 3 CKD, encephalopathy.  History primarily from patient's daughter as well as wife in the setting of dementia.  Per report, patient with worsening weakness, lethargy over the past few days.  Also with very foul-smelling urine.  No reported fevers or chills.  No reported nausea or vomiting.  No reports of cough, chest pain or shortness of breath.  Baseline dementia with chronic confusion.  This is progressively worsened.  Per the daughter, this is similar to prior patterns associated with UTI. Presented to the ER Tmax 100.1, BP stable.  No hypoxia.  White count 6.9, hemoglobin 10.9, platelets 75.  Lactate 1.2.  Creatinine 2.2, urinalysis indicative of infection.  Chest x-ray and EKG grossly stable.   Daniel Sherman  XBJ:[id number] DOB: [birth year] DOA: 05/10/2023 PCP: Jerl Mina, MD    Assessment & Plan:   Principal Problem:   UTI (urinary tract infection) Active Problems:   Acute renal failure superimposed on stage 3a chronic kidney disease (HCC)   Encephalopathy   Primary thrombocytopenia (HCC)   Diabetes mellitus without complication (HCC)   Essential hypertension   Pancytopenia (HCC)  Assessment and Plan: UTI: urine cx was not done. Continue on IV rocephin    Acute toxic metabolic encephalopathy: likely secondary to UTI, dementia. Continue on IV abxs  AKI on CKDIIIa: baseline Cr around 1.6-1.9. Cr is trending down from day prior  Possible rhabdomyolysis: CK is WNL. Resolved    Primary thrombocytopenia: etiology unclear. Continues to trend down. Pt is Jehovah's witness and does not accept blood products. Will continue to monitor    Normocytic  anemia: H&H are trending down today. Does not accept blood transfusions    HTN: holding lisinopril secondary to AKI on CKD.    DM2: well controlled, HbA1c 6.1 Continue on SSI w/ accuchecks   Depression: continue on home dose of lexapro  Dementia: continue on home dose of namenda. Continue w/ supportive care   DVT prophylaxis: SCDs Code Status:  full  Family Communication: discussed pt's care w/ pt's family at bedside and answered their questions  Disposition Plan: likely d/c back home. Can d/c home tomorrow if platelets are stable and/or trending up   Level of care: Med-Surg  Status is: Observation The patient remains OBS appropriate and will d/c before 2 midnights.    Consultants:    Procedures:   Antimicrobials: rocephin    Subjective: Pt is pleasantly confused   Objective: Vitals:   05/11/23 1200 05/11/23 1645 05/11/23 2001 05/12/23 0526  BP: 110/60 104/89 (!) 103/47 (!) 102/56  Pulse: 68 82 77 72  Resp: 16 16 18 18   Temp: 98 F (36.7 C) 98.7 F (37.1 C) 98.5 F (36.9 C) 98 F (36.7 C)  TempSrc: Oral     SpO2:  96% 98% 96%  Weight:      Height:        Intake/Output Summary (Last 24 hours) at 05/12/2023 0758 Last data filed at 05/11/2023 1300 Gross per 24 hour  Intake --  Output 400 ml  Net -400 ml   Filed Weights   05/10/23 0835 05/10/23 2224  Weight: 76 kg 87.4 kg    Examination:  General exam: Appears comfortable  Respiratory system:  clear breath sounds b/l  Cardiovascular system: S1/S2+. No rubs or clicks   Gastrointestinal system: abd is soft, NT, obese & normal bowel sounds  Central nervous system: Alert & awake. Moves all extremities  Psychiatry: Judgement and insight appears poor. Flat mood and affect     Data Reviewed: I have personally reviewed following labs and imaging studies  CBC: Recent Labs  Lab 05/10/23 0849 05/11/23 0631 05/12/23 0533  WBC 6.9 5.1 2.1*  NEUTROABS 6.3  --   --   HGB 10.9* 10.8* 8.7*  HCT 34.9* 34.1*  27.0*  MCV 84.5 85.3 83.3  PLT 75* 60* 55*   Basic Metabolic Panel: Recent Labs  Lab 05/10/23 0849 05/11/23 0631 05/12/23 0533  NA 138 136 137  K 5.0 4.5 3.9  CL 105 108 110  CO2 23 18* 19*  GLUCOSE 156* 118* 106*  BUN 38* 36* 39*  CREATININE 2.21* 2.08* 1.85*  CALCIUM 8.6* 8.3* 7.4*   GFR: Estimated Creatinine Clearance: 31.3 mL/min (A) (by C-G formula based on SCr of 1.85 mg/dL (H)). Liver Function Tests: Recent Labs  Lab 05/10/23 0849 05/11/23 0631  AST 15 39  ALT 10 12  ALKPHOS 66 51  BILITOT 1.2 1.1  PROT 7.0 6.7  ALBUMIN 3.8 3.3*   No results for input(s): "LIPASE", "AMYLASE" in the last 168 hours. No results for input(s): "AMMONIA" in the last 168 hours. Coagulation Profile: No results for input(s): "INR", "PROTIME" in the last 168 hours. Cardiac Enzymes: Recent Labs  Lab 05/10/23 1941 05/12/23 0533  CKTOTAL 1,114* 364   BNP (last 3 results) No results for input(s): "PROBNP" in the last 8760 hours. HbA1C: No results for input(s): "HGBA1C" in the last 72 hours. CBG: Recent Labs  Lab 05/10/23 2003 05/11/23 0857 05/11/23 1203 05/11/23 1706 05/11/23 2106  GLUCAP 108* 115* 91 143* 102*   Lipid Profile: No results for input(s): "CHOL", "HDL", "LDLCALC", "TRIG", "CHOLHDL", "LDLDIRECT" in the last 72 hours. Thyroid Function Tests: No results for input(s): "TSH", "T4TOTAL", "FREET4", "T3FREE", "THYROIDAB" in the last 72 hours. Anemia Panel: No results for input(s): "VITAMINB12", "FOLATE", "FERRITIN", "TIBC", "IRON", "RETICCTPCT" in the last 72 hours. Sepsis Labs: Recent Labs  Lab 05/10/23 0849 05/10/23 1021 05/10/23 1941  LATICACIDVEN 1.2 0.9 1.5    Recent Results (from the past 240 hour(s))  SARS Coronavirus 2 by RT PCR (hospital order, performed in [facility] hospital lab) *cepheid single result test* Urine, Clean Catch     Status: None   Collection Time: 05/10/23  8:49 AM   Specimen: Urine, Clean Catch; Nasal Swab  Result Value Ref Range  Status   SARS Coronavirus 2 by RT PCR NEGATIVE NEGATIVE Final    Comment: (NOTE) SARS-CoV-2 target nucleic acids are NOT DETECTED.  The SARS-CoV-2 RNA is generally detectable in upper and lower respiratory specimens during the acute phase of infection. The lowest concentration of SARS-CoV-2 viral copies this assay can detect is 250 copies / mL. A negative result does not preclude SARS-CoV-2 infection and should not be used as the sole basis for treatment or other patient management decisions.  A negative result may occur with improper specimen collection / handling, submission of specimen other than nasopharyngeal swab, presence of viral mutation(s) within the areas targeted by this assay, and inadequate number of viral copies (<250 copies / mL). A negative result must be combined with clinical observations, patient history, and epidemiological information.  Fact Sheet for Patients:   RoadLapTop.co.za  Fact Sheet for Healthcare Providers: http://kim-miller.com/  This  test is not yet approved or  cleared by the [place] and has been authorized for detection and/or diagnosis of SARS-CoV-2 by FDA under an Emergency Use Authorization (EUA).  This EUA will remain in effect (meaning this test can be used) for the duration of the COVID-19 declaration under Section 564(b)(1) of the Act, 21 U.S.C. section 360bbb-3(b)(1), unless the authorization is terminated or revoked sooner.  Performed at [facility], [address] [place], [place] [address]   Blood Culture (routine x 2)     Status: None (Preliminary result)   Collection Time: 05/10/23  8:59 AM   Specimen: BLOOD RIGHT HAND  Result Value Ref Range Status   Specimen Description BLOOD RIGHT HAND  Final   Special Requests   Final    BOTTLES DRAWN AEROBIC AND ANAEROBIC Blood Culture adequate volume   Culture   Final    NO GROWTH 2 DAYS Performed at [facility], [address] [place], [place] [address]    Report Status PENDING  Incomplete  Blood Culture (routine x 2)     Status: None (Preliminary result)   Collection Time: 05/10/23  8:59 AM   Specimen: BLOOD  Result Value Ref Range Status   Specimen Description BLOOD LEFT ANTECUBITAL  Final   Special Requests   Final    BOTTLES DRAWN AEROBIC AND ANAEROBIC Blood Culture results may not be optimal due to an excessive volume of blood received in culture bottles   Culture   Final    NO GROWTH 2 DAYS Performed at [facility], [address] [place], [place] [address]    Report Status PENDING  Incomplete         Radiology Studies: DG Chest Port 1 View  Result Date: 05/10/2023 CLINICAL DATA:  Shortness of breath. EXAM: PORTABLE CHEST 1 VIEW COMPARISON:  May 10, 2023. FINDINGS: The heart size and mediastinal contours are within normal limits. Both lungs are clear. The visualized skeletal structures are unremarkable. IMPRESSION: No active disease. Electronically Signed   By: Lupita Raider M.D.   On: 05/10/2023 13:09   DG Chest Port 1 View  Result Date: 05/10/2023 CLINICAL DATA:  Questionable sepsis EXAM: PORTABLE CHEST 1 VIEW COMPARISON:  11/11/2022 FINDINGS: Low volume chest with indistinct density at the lung bases. No edema, effusion, or pneumothorax. Normal heart size and mediastinal contours. IMPRESSION: Low volume chest with atelectasis or infiltrate in the lower lungs. Electronically Signed   By: Tiburcio Pea M.D.   On: 05/10/2023 08:50        Scheduled Meds:  colestipol  1 g Oral BID   donepezil  10 mg Oral Daily   escitalopram  5 mg Oral Daily   insulin aspart  0-15 Units Subcutaneous TID WC   insulin aspart  3 Units Subcutaneous TID WC   memantine  10 mg Oral BID   Continuous Infusions:  sodium chloride 75 mL/hr at 05/11/23 0916   cefTRIAXone (ROCEPHIN)  IV Stopped (05/11/23 1100)     LOS: 1 day    Time spent: 25 mins     Charise Killian,  MD [facility] Pager [phone]  If 7PM-7AM, please contact night-coverage [email or web address] 05/12/2023, 7:58 AM

## 2023-05-12 NOTE — Plan of Care (Signed)

## 2023-05-13 DIAGNOSIS — N390 Urinary tract infection, site not specified: Secondary | ICD-10-CM | POA: Diagnosis not present

## 2023-05-13 LAB — BASIC METABOLIC PANEL
Anion gap: 8 (ref 5–15)
BUN: 35 mg/dL — ABNORMAL HIGH (ref 8–23)
CO2: 19 mmol/L — ABNORMAL LOW (ref 22–32)
Calcium: 7.7 mg/dL — ABNORMAL LOW (ref 8.9–10.3)
Chloride: 108 mmol/L (ref 98–111)
Creatinine, Ser: 1.6 mg/dL — ABNORMAL HIGH (ref 0.61–1.24)
GFR, Estimated: 43 mL/min — ABNORMAL LOW (ref >60)
Glucose, Bld: 107 mg/dL — ABNORMAL HIGH (ref 70–99)
Potassium: 4 mmol/L (ref 3.5–5.1)
Sodium: 135 mmol/L (ref 135–145)

## 2023-05-13 LAB — CBC
HCT: 28.2 % — ABNORMAL LOW (ref 39.0–52.0)
Hemoglobin: 9.2 g/dL — ABNORMAL LOW (ref 13.0–17.0)
MCH: 26.5 pg (ref 26.0–34.0)
MCHC: 32.6 g/dL (ref 30.0–36.0)
MCV: 81.3 fL (ref 80.0–100.0)
Platelets: 64 10*3/uL — ABNORMAL LOW (ref 150–400)
RBC: 3.47 MIL/uL — ABNORMAL LOW (ref 4.22–5.81)
RDW: 14.1 % (ref 11.5–15.5)
WBC: 2 10*3/uL — ABNORMAL LOW (ref 4.0–10.5)
nRBC: 0 % (ref 0.0–0.2)

## 2023-05-13 LAB — CULTURE, BLOOD (ROUTINE X 2)

## 2023-05-13 LAB — GLUCOSE, CAPILLARY
Glucose-Capillary: 124 mg/dL — ABNORMAL HIGH (ref 70–99)
Glucose-Capillary: 105 mg/dL — ABNORMAL HIGH (ref 70–99)

## 2023-05-13 MED ORDER — CEFDINIR 300 MG PO CAPS: 300.0000 mg | ORAL_CAPSULE | Freq: Two times a day (BID) | ORAL | 0 refills | Status: AC

## 2023-05-13 MED ORDER — METFORMIN HCL 1000 MG PO TABS: 1000.0000 mg | ORAL_TABLET | Freq: Every day | ORAL | Status: DC

## 2023-05-13 NOTE — Discharge Summary (Signed)
Daniel Sherman AVW:[id number] DOB: [birth year] DOA: 05/10/2023  PCP: Jerl Mina, MD  Admit date: 05/10/2023 Discharge date: 05/13/2023  Time spent: 35 minutes  Recommendations for Outpatient Follow-up:  Pcp f/u     Discharge Diagnoses:  Principal Problem:   UTI (urinary tract infection) Active Problems:   Acute renal failure superimposed on stage 3a chronic kidney disease (HCC)   Encephalopathy   Primary thrombocytopenia (HCC)   Diabetes mellitus without complication (HCC)   Essential hypertension   Pancytopenia (HCC)   Discharge Condition: stable  Diet recommendation: heart healthy carb modified  Filed Weights   05/10/23 0835 05/10/23 2224  Weight: 76 kg 87.4 kg    History of present illness:  From admission h and p Daniel Sherman is a 82 y.o. male with medical history significant of  type 2 diabetes mellitus, hypertension, Colon cancer status post right hemicolectomy, primary pancreatic neuroendocrine tumor, CAD, chronic thrombocytopenia, HTN, stage 3 CKD, dementia presenting w/ UTI, acute on chronic stage 3 CKD, encephalopathy.  History primarily from patient's daughter as well as wife in the setting of dementia.  Per report, patient with worsening weakness, lethargy over the past few days.  Also with very foul-smelling urine.  No reported fevers or chills.  No reported nausea or vomiting.  No reports of cough, chest pain or shortness of breath.  Baseline dementia with chronic confusion.  This is progressively worsened.  Per the daughter, this is similar to prior patterns associated with UTI.   Hospital Course:  Patient presents with weakness and confusion, similar to previous episodes of uti. Urinalysis suggestive of infection but culture not sent. Also with AKI that resolved with fluids which have been discontinued and patient is tolerating PO> Patient was treated for presumed uti with ceftriaxone and mental status and weakness improved. Evaluated by PT day of discharge and  felt to be more or less back to his baseline. Will complete a 7 day course of cephalosporin. Chronic conditions stable including pancytopenia, is followed by heme/onc as outpatient and advise ongoing f/u with them.   Procedures: none   Consultations: none  Discharge Exam: Vitals:   05/13/23 0513 05/13/23 0744  BP: 123/71 124/60  Pulse: 64 67  Resp: 20 17  Temp: 98.6 F (37 C) 97.8 F (36.6 C)  SpO2: 97% 100%    General: NAD Cardiovascular: RRR Respiratory: normal WOB Ext: no edema, warm  Discharge Instructions   Discharge Instructions     Diet Carb Modified   Complete by: As directed    Increase activity slowly   Complete by: As directed       Allergies as of 05/13/2023       Reactions   Other    BLOOD PRODUCT REFUSAL        Medication List     STOP taking these medications    iron polysaccharides 150 MG capsule Commonly known as: NIFEREX   omeprazole 40 MG capsule Commonly known as: PRILOSEC   VITAMIN B-12 IJ       TAKE these medications    benzonatate 200 MG capsule Commonly known as: TESSALON Take 200 mg by mouth 3 (three) times daily as needed.   cefdinir 300 MG capsule Commonly known as: OMNICEF Take 1 capsule (300 mg total) by mouth 2 (two) times daily for 3 days.   colestipol 1 g tablet Commonly known as: COLESTID Take 1 g by mouth 2 (two) times daily.   donepezil 10 MG tablet Commonly known as: ARICEPT Take 10 mg  by mouth daily.   escitalopram 5 MG tablet Commonly known as: LEXAPRO Take 5 mg by mouth in the morning and at bedtime.   gabapentin 300 MG capsule Commonly known as: NEURONTIN Take 300 mg by mouth daily.   hydrochlorothiazide 12.5 MG tablet Commonly known as: HYDRODIURIL Take 12.5 mg by mouth daily.   linagliptin 5 MG Tabs tablet Commonly known as: TRADJENTA Take 5 mg by mouth daily.   lisinopril 40 MG tablet Commonly known as: ZESTRIL Take 40 mg by mouth every evening.   memantine 10 MG  tablet Commonly known as: NAMENDA Take 10 mg by mouth 2 (two) times daily.   metFORMIN 1000 MG tablet Commonly known as: GLUCOPHAGE Take 1 tablet (1,000 mg total) by mouth daily with breakfast. What changed: when to take this   Niferex Tabs Take by mouth.   simvastatin 40 MG tablet Commonly known as: ZOCOR Take 40 mg by mouth every evening.   VITAMIN D PO Take 1 tablet by mouth daily.       Allergies  Allergen Reactions   Other     BLOOD PRODUCT REFUSAL    Follow-up Information     Jerl Mina, MD Follow up.   Specialty: Family Medicine Contact information: [address] [place] [address] [phone]                  The results of significant diagnostics from this hospitalization (including imaging, microbiology, ancillary and laboratory) are listed below for reference.    Significant Diagnostic Studies: DG Chest Port 1 View  Result Date: 05/10/2023 CLINICAL DATA:  Shortness of breath. EXAM: PORTABLE CHEST 1 VIEW COMPARISON:  May 10, 2023. FINDINGS: The heart size and mediastinal contours are within normal limits. Both lungs are clear. The visualized skeletal structures are unremarkable. IMPRESSION: No active disease. Electronically Signed   By: Lupita Raider M.D.   On: 05/10/2023 13:09   DG Chest Port 1 View  Result Date: 05/10/2023 CLINICAL DATA:  Questionable sepsis EXAM: PORTABLE CHEST 1 VIEW COMPARISON:  11/11/2022 FINDINGS: Low volume chest with indistinct density at the lung bases. No edema, effusion, or pneumothorax. Normal heart size and mediastinal contours. IMPRESSION: Low volume chest with atelectasis or infiltrate in the lower lungs. Electronically Signed   By: Tiburcio Pea M.D.   On: 05/10/2023 08:50    Microbiology: Recent Results (from the past 240 hour(s))  SARS Coronavirus 2 by RT PCR (hospital order, performed in [facility] hospital lab) *cepheid single result test* Urine, Clean Catch     Status: None   Collection Time:  05/10/23  8:49 AM   Specimen: Urine, Clean Catch; Nasal Swab  Result Value Ref Range Status   SARS Coronavirus 2 by RT PCR NEGATIVE NEGATIVE Final    Comment: (NOTE) SARS-CoV-2 target nucleic acids are NOT DETECTED.  The SARS-CoV-2 RNA is generally detectable in upper and lower respiratory specimens during the acute phase of infection. The lowest concentration of SARS-CoV-2 viral copies this assay can detect is 250 copies / mL. A negative result does not preclude SARS-CoV-2 infection and should not be used as the sole basis for treatment or other patient management decisions.  A negative result may occur with improper specimen collection / handling, submission of specimen other than nasopharyngeal swab, presence of viral mutation(s) within the areas targeted by this assay, and inadequate number of viral copies (<250 copies / mL). A negative result must be combined with clinical observations, patient history, and epidemiological information.  Fact  Sheet for Patients:   RoadLapTop.co.za  Fact Sheet for Healthcare Providers: http://kim-miller.com/  This test is not yet approved or  cleared by the [place] FDA and has been authorized for detection and/or diagnosis of SARS-CoV-2 by FDA under an Emergency Use Authorization (EUA).  This EUA will remain in effect (meaning this test can be used) for the duration of the COVID-19 declaration under Section 564(b)(1) of the Act, 21 U.S.C. section 360bbb-3(b)(1), unless the authorization is terminated or revoked sooner.  Performed at [facility], [address] [place], [place] [address]   Blood Culture (routine x 2)     Status: None (Preliminary result)   Collection Time: 05/10/23  8:59 AM   Specimen: BLOOD RIGHT HAND  Result Value Ref Range Status   Specimen Description BLOOD RIGHT HAND  Final   Special Requests   Final    BOTTLES DRAWN AEROBIC AND ANAEROBIC Blood Culture  adequate volume   Culture   Final    NO GROWTH 3 DAYS Performed at [facility], [address] [place], [place] [address]    Report Status PENDING  Incomplete  Blood Culture (routine x 2)     Status: None (Preliminary result)   Collection Time: 05/10/23  8:59 AM   Specimen: BLOOD  Result Value Ref Range Status   Specimen Description BLOOD LEFT ANTECUBITAL  Final   Special Requests   Final    BOTTLES DRAWN AEROBIC AND ANAEROBIC Blood Culture results may not be optimal due to an excessive volume of blood received in culture bottles   Culture   Final    NO GROWTH 3 DAYS Performed at [facility], [address] [place], [place] [address]    Report Status PENDING  Incomplete     Labs: Basic Metabolic Panel: Recent Labs  Lab 05/10/23 0849 05/11/23 0631 05/12/23 0533 05/13/23 0536  NA 138 136 137 135  K 5.0 4.5 3.9 4.0  CL 105 108 110 108  CO2 23 18* 19* 19*  GLUCOSE 156* 118* 106* 107*  BUN 38* 36* 39* 35*  CREATININE 2.21* 2.08* 1.85* 1.60*  CALCIUM 8.6* 8.3* 7.4* 7.7*   Liver Function Tests: Recent Labs  Lab 05/10/23 0849 05/11/23 0631  AST 15 39  ALT 10 12  ALKPHOS 66 51  BILITOT 1.2 1.1  PROT 7.0 6.7  ALBUMIN 3.8 3.3*   No results for input(s): "LIPASE", "AMYLASE" in the last 168 hours. No results for input(s): "AMMONIA" in the last 168 hours. CBC: Recent Labs  Lab 05/10/23 0849 05/11/23 0631 05/12/23 0533 05/13/23 0536  WBC 6.9 5.1 2.1* 2.0*  NEUTROABS 6.3  --   --   --   HGB 10.9* 10.8* 8.7* 9.2*  HCT 34.9* 34.1* 27.0* 28.2*  MCV 84.5 85.3 83.3 81.3  PLT 75* 60* 55* 64*   Cardiac Enzymes: Recent Labs  Lab 05/10/23 1941 05/12/23 0533  CKTOTAL 1,114* 364   BNP: BNP (last 3 results) No results for input(s): "BNP" in the last 8760 hours.  ProBNP (last 3 results) No results for input(s): "PROBNP" in the last 8760 hours.  CBG: Recent Labs  Lab 05/12/23 1117 05/12/23 1616 05/12/23 2012 05/13/23 0743 05/13/23 1129   GLUCAP 124* 89 105* 124* 105*       Signed:  Silvano Bilis MD.  [facility] 05/13/2023, 11:47 AM

## 2023-05-13 NOTE — Plan of Care (Signed)
Patient is adequate for discharge to home environment with home health and family support.  Patient transported in private vehicle in stable condition.

## 2023-05-13 NOTE — Progress Notes (Signed)
Physical Therapy Treatment Patient Details Name: Daniel Sherman MRN: [id number] DOB: 1941-06-10 Today's Date: 05/13/2023   History of Present Illness Pt admitted to [facility] on 05/10/23 under observation for c/o AMS, generalized weakness, and fever. Admitted for treatment of UTI, acute on chronic CKD (III), and encephalopathy. Significant PMH includes: dementia, T2DM, HTN, colon CA s/p R hemicolectomy, primary pancreatic neuroendocrine tumor, CAD, chronic thrombocytopenia, CKD (III).    PT Comments    Ready for session.  Able to get to EOB with supervision.  Steady in sitting and stands with contact guard.  He is able to walk x 2 laps on unit with and without RW.  He does lean left during gait which is improved with RW.  He has one at home and it is recommended that he use it initially upon discharge.  Encouraged family to provide +1 assist for mobility and they are given a gait belt to increase safety.  Discussed discharge with family and MD.  They do not want a wheelchair and given mobility today it is not needed at this time.  HH PT/OT and any aide services he can have would be beneficial to pt and family.   Recommendations for follow up therapy are one component of a multi-disciplinary discharge planning process, led by the attending physician.  Recommendations may be updated based on patient status, additional functional criteria and insurance authorization.  Follow Up Recommendations       Assistance Recommended at Discharge Frequent or constant Supervision/Assistance  Patient can return home with the following A little help with bathing/dressing/bathroom;Assistance with cooking/housework;Help with stairs or ramp for entrance;Assist for transportation;Direct supervision/assist for medications management;A little help with walking and/or transfers   Equipment Recommendations       Recommendations for Other Services       Precautions / Restrictions Precautions Precautions:  Fall Restrictions Weight Bearing Restrictions: No     Mobility  Bed Mobility Overal bed mobility: Needs Assistance Bed Mobility: Supine to Sit     Supine to sit: Supervision          Transfers Overall transfer level: Needs assistance Equipment used: Rolling walker (2 wheels), None Transfers: Sit to/from Stand Sit to Stand: Min guard                Ambulation/Gait Ambulation/Gait assistance: Min assist, Min guard Gait Distance (Feet): 350 Feet Assistive device: Rolling walker (2 wheels), None Gait Pattern/deviations: Step-through pattern, Decreased step length - right, Decreased step length - left, Trunk flexed Gait velocity: WFL     General Gait Details: gait with and without RW.  does not use at baseline but is much improved at this time with RW which he has at home   Stairs             Wheelchair Mobility    Modified Rankin (Stroke Patients Only)       Balance Overall balance assessment: Needs assistance Sitting-balance support: Feet supported, Bilateral upper extremity supported Sitting balance-Leahy Scale: Good     Standing balance support: No upper extremity supported Standing balance-Leahy Scale: Poor Standing balance comment: leans left with and without AD but is improved with AD                            Cognition Arousal/Alertness: Awake/alert Behavior During Therapy: WFL for tasks assessed/performed Overall Cognitive Status: History of cognitive impairments - at baseline  Exercises      General Comments        Pertinent Vitals/Pain Pain Assessment Pain Assessment: No/denies pain    Home Living                          Prior Function            PT Goals (current goals can now be found in the care plan section) Progress towards PT goals: Progressing toward goals    Frequency    Min 2X/week      PT Plan Current plan remains  appropriate    Co-evaluation              AM-PAC PT "6 Clicks" Mobility   Outcome Measure  Help needed turning from your back to your side while in a flat bed without using bedrails?: None Help needed moving from lying on your back to sitting on the side of a flat bed without using bedrails?: None Help needed moving to and from a bed to a chair (including a wheelchair)?: A Little Help needed standing up from a chair using your arms (e.g., wheelchair or bedside chair)?: A Little Help needed to walk in hospital room?: A Little Help needed climbing 3-5 steps with a railing? : A Little 6 Click Score: 20    End of Session Equipment Utilized During Treatment: Gait belt Activity Tolerance: Patient tolerated treatment well Patient left: in chair;with call bell/phone within reach;with chair alarm set;with family/visitor present Nurse Communication: Mobility status PT Visit Diagnosis: Unsteadiness on feet (R26.81);Difficulty in walking, not elsewhere classified (R26.2)     Time: 1610-9604 PT Time Calculation (min) (ACUTE ONLY): 28 min  Charges:  $Gait Training: 23-37 mins                   Danielle Dess, PTA 05/13/23, 11:34 AM

## 2023-05-13 NOTE — TOC Transition Note (Signed)
Transition of Care [facility] - CM/SW Discharge Note   Patient Details  Name: Daniel Sherman MRN: [id number] Date of Birth: 28-Dec-1940  Transition of [facility]) CM/SW Contact:  Allena Katz, LCSW Phone Number: 05/13/2023, 12:04 PM   Clinical Narrative:   Pt has orders to discharge home with [facility] PT/OT/RN/AID. Adelina Mings with [facility] notified. Family reported additional needs for aide assistance at home. CSW spoke with wife about helper bees through [place]. Wife agreeable to referral being made and would like [facility] to contact her son. CSW signing off.     Final next level of care: Home w Home Health Services Barriers to Discharge: Barriers Resolved   Patient Goals and CMS Choice CMS Medicare.gov Compare Post Acute Care list provided to:: Patient Choice offered to / list presented to : Patient  Discharge Placement                  Patient to be transferred to facility by: family   Patient and family notified of of transfer: 05/13/23  Discharge Plan and Services Additional resources added to the After Visit Summary for     Discharge Planning Services: CM Consult                      HH Arranged: RN, PT, OT, Nurse's Aide, PCS/Personal Care Services [facility] Agency: [facility] Date [facility] Agency Contacted: 05/13/23 Time HH Agency Contacted: 1200    Social Determinants of Health (SDOH) Interventions SDOH Screenings   Food Insecurity: No Food Insecurity (05/10/2023)  Housing: Low Risk  (05/10/2023)  Transportation Needs: No Transportation Needs (05/10/2023)  Utilities: Not At Risk (05/10/2023)  Alcohol Screen: Low Risk  (04/19/2023)  Depression (PHQ2-9): Low Risk  (04/19/2023)  Financial Resource Strain: Medium Risk (04/19/2023)  Physical Activity: Inactive (04/19/2023)  Social Connections: Moderately Isolated (04/19/2023)  Stress: Stress Concern Present (04/19/2023)  Tobacco Use: Medium Risk (03/09/2023)     Readmission Risk Interventions    11/14/2022   12:51  PM  Readmission Risk Prevention Plan  Transportation Screening Complete  PCP or Specialist Appt within 3-5 Days Complete  HRI or Home Care Consult Complete  Social Work Consult for Recovery Care Planning/Counseling Complete  Palliative Care Screening Not Applicable  Medication Review Oceanographer) Referral to Pharmacy

## 2023-05-14 LAB — CULTURE, BLOOD (ROUTINE X 2): Culture: NO GROWTH

## 2023-05-15 DIAGNOSIS — D6949 Other primary thrombocytopenia: Secondary | ICD-10-CM | POA: Diagnosis not present

## 2023-05-15 DIAGNOSIS — E1159 Type 2 diabetes mellitus with other circulatory complications: Secondary | ICD-10-CM | POA: Diagnosis not present

## 2023-05-15 DIAGNOSIS — D509 Iron deficiency anemia, unspecified: Secondary | ICD-10-CM | POA: Diagnosis not present

## 2023-05-15 DIAGNOSIS — I129 Hypertensive chronic kidney disease with stage 1 through stage 4 chronic kidney disease, or unspecified chronic kidney disease: Secondary | ICD-10-CM | POA: Diagnosis not present

## 2023-05-15 DIAGNOSIS — N390 Urinary tract infection, site not specified: Secondary | ICD-10-CM | POA: Diagnosis not present

## 2023-05-15 DIAGNOSIS — D61818 Other pancytopenia: Secondary | ICD-10-CM | POA: Diagnosis not present

## 2023-05-15 DIAGNOSIS — I152 Hypertension secondary to endocrine disorders: Secondary | ICD-10-CM | POA: Diagnosis not present

## 2023-05-15 DIAGNOSIS — G309 Alzheimer's disease, unspecified: Secondary | ICD-10-CM | POA: Diagnosis not present

## 2023-05-15 DIAGNOSIS — E1122 Type 2 diabetes mellitus with diabetic chronic kidney disease: Secondary | ICD-10-CM | POA: Diagnosis not present

## 2023-05-15 DIAGNOSIS — E119 Type 2 diabetes mellitus without complications: Secondary | ICD-10-CM | POA: Diagnosis not present

## 2023-05-15 DIAGNOSIS — Z794 Long term (current) use of insulin: Secondary | ICD-10-CM | POA: Diagnosis not present

## 2023-05-15 DIAGNOSIS — E1169 Type 2 diabetes mellitus with other specified complication: Secondary | ICD-10-CM | POA: Diagnosis not present

## 2023-05-15 DIAGNOSIS — G928 Other toxic encephalopathy: Secondary | ICD-10-CM | POA: Diagnosis not present

## 2023-05-15 DIAGNOSIS — N1831 Chronic kidney disease, stage 3a: Secondary | ICD-10-CM | POA: Diagnosis not present

## 2023-05-15 DIAGNOSIS — E785 Hyperlipidemia, unspecified: Secondary | ICD-10-CM | POA: Diagnosis not present

## 2023-05-15 DIAGNOSIS — R4182 Altered mental status, unspecified: Secondary | ICD-10-CM | POA: Diagnosis not present

## 2023-05-15 LAB — CULTURE, BLOOD (ROUTINE X 2): Culture: NO GROWTH

## 2023-05-15 NOTE — Consult Note (Signed)
[facility] Customer service manager [facility]) Accountable Care Organization (ACO) [facility] Liaison Note  05/15/2023  Daniel Sherman [birth year] [id number]  Location: [facility] RN Hospital Liaison screened the patient remotely at [facility].  Insurance: Daniel Sherman is a 82 y.o. male who is a Primary Care Patient of Jerl Mina, MD. The patient was screened for readmission hospitalization with noted high risk score for unplanned readmission risk with 2 IP in 6 months.  The patient was assessed for potential [facility]) Care Management service needs for post hospital transition for care coordination. Review of patient's electronic medical record reveals patient as admitted with  AKI 2nd to UTI. Pt discharged with HHPT/OT/RN/Aide services. And has supportive family.   Plan: [facility] Liaison will continue to follow progress and disposition to asess for post hospital community care coordination/management needs.  Referral request for community care coordination: anticipate [facility] Transitions of Care Team follow up.   [facility] Care Management/Population Health does not replace or interfere with any arrangements made by the Inpatient Transition of Care team.   For questions contact:   Elliot Cousin, RN, BSN [facility] Liaison [place]   [facility]  Population Health Office Hours MTWF  8:00 am-6:00 pm Off on Thursday [phone] mobile [phone] [Office toll free line][facility] Office Hours are M-F 8:30 - 5 pm 24 hour nurse advise line [phone] Concierge  [email or web address]

## 2023-05-17 DIAGNOSIS — R4182 Altered mental status, unspecified: Secondary | ICD-10-CM | POA: Diagnosis not present

## 2023-05-17 DIAGNOSIS — N390 Urinary tract infection, site not specified: Secondary | ICD-10-CM | POA: Diagnosis not present

## 2023-05-17 DIAGNOSIS — G928 Other toxic encephalopathy: Secondary | ICD-10-CM | POA: Diagnosis not present

## 2023-05-17 DIAGNOSIS — E1122 Type 2 diabetes mellitus with diabetic chronic kidney disease: Secondary | ICD-10-CM | POA: Diagnosis not present

## 2023-05-17 DIAGNOSIS — I129 Hypertensive chronic kidney disease with stage 1 through stage 4 chronic kidney disease, or unspecified chronic kidney disease: Secondary | ICD-10-CM | POA: Diagnosis not present

## 2023-05-17 DIAGNOSIS — G309 Alzheimer's disease, unspecified: Secondary | ICD-10-CM | POA: Diagnosis not present

## 2023-05-17 DIAGNOSIS — D61818 Other pancytopenia: Secondary | ICD-10-CM | POA: Diagnosis not present

## 2023-05-17 DIAGNOSIS — N1831 Chronic kidney disease, stage 3a: Secondary | ICD-10-CM | POA: Diagnosis not present

## 2023-05-17 DIAGNOSIS — D6949 Other primary thrombocytopenia: Secondary | ICD-10-CM | POA: Diagnosis not present

## 2023-05-20 DIAGNOSIS — F0280 Dementia in other diseases classified elsewhere without behavioral disturbance: Secondary | ICD-10-CM | POA: Diagnosis not present

## 2023-05-20 DIAGNOSIS — G309 Alzheimer's disease, unspecified: Secondary | ICD-10-CM | POA: Diagnosis not present

## 2023-05-20 DIAGNOSIS — E1122 Type 2 diabetes mellitus with diabetic chronic kidney disease: Secondary | ICD-10-CM | POA: Diagnosis not present

## 2023-05-20 DIAGNOSIS — N1832 Chronic kidney disease, stage 3b: Secondary | ICD-10-CM | POA: Diagnosis not present

## 2023-05-20 DIAGNOSIS — F0150 Vascular dementia without behavioral disturbance: Secondary | ICD-10-CM | POA: Diagnosis not present

## 2023-05-20 DIAGNOSIS — N179 Acute kidney failure, unspecified: Secondary | ICD-10-CM | POA: Diagnosis not present

## 2023-05-22 DIAGNOSIS — I129 Hypertensive chronic kidney disease with stage 1 through stage 4 chronic kidney disease, or unspecified chronic kidney disease: Secondary | ICD-10-CM | POA: Diagnosis not present

## 2023-05-22 DIAGNOSIS — E1122 Type 2 diabetes mellitus with diabetic chronic kidney disease: Secondary | ICD-10-CM | POA: Diagnosis not present

## 2023-05-22 DIAGNOSIS — R4182 Altered mental status, unspecified: Secondary | ICD-10-CM | POA: Diagnosis not present

## 2023-05-22 DIAGNOSIS — N1831 Chronic kidney disease, stage 3a: Secondary | ICD-10-CM | POA: Diagnosis not present

## 2023-05-22 DIAGNOSIS — D6949 Other primary thrombocytopenia: Secondary | ICD-10-CM | POA: Diagnosis not present

## 2023-05-22 DIAGNOSIS — G928 Other toxic encephalopathy: Secondary | ICD-10-CM | POA: Diagnosis not present

## 2023-05-22 DIAGNOSIS — N390 Urinary tract infection, site not specified: Secondary | ICD-10-CM | POA: Diagnosis not present

## 2023-05-30 DIAGNOSIS — E538 Deficiency of other specified B group vitamins: Secondary | ICD-10-CM | POA: Diagnosis not present

## 2023-06-14 DIAGNOSIS — D61818 Other pancytopenia: Secondary | ICD-10-CM | POA: Diagnosis not present

## 2023-06-14 DIAGNOSIS — I129 Hypertensive chronic kidney disease with stage 1 through stage 4 chronic kidney disease, or unspecified chronic kidney disease: Secondary | ICD-10-CM | POA: Diagnosis not present

## 2023-06-14 DIAGNOSIS — R4182 Altered mental status, unspecified: Secondary | ICD-10-CM | POA: Diagnosis not present

## 2023-06-14 DIAGNOSIS — N1831 Chronic kidney disease, stage 3a: Secondary | ICD-10-CM | POA: Diagnosis not present

## 2023-06-14 DIAGNOSIS — D6949 Other primary thrombocytopenia: Secondary | ICD-10-CM | POA: Diagnosis not present

## 2023-06-14 DIAGNOSIS — E1122 Type 2 diabetes mellitus with diabetic chronic kidney disease: Secondary | ICD-10-CM | POA: Diagnosis not present

## 2023-06-14 DIAGNOSIS — G928 Other toxic encephalopathy: Secondary | ICD-10-CM | POA: Diagnosis not present

## 2023-06-14 DIAGNOSIS — G309 Alzheimer's disease, unspecified: Secondary | ICD-10-CM | POA: Diagnosis not present

## 2023-06-14 DIAGNOSIS — N390 Urinary tract infection, site not specified: Secondary | ICD-10-CM | POA: Diagnosis not present

## 2023-06-26 DIAGNOSIS — D61818 Other pancytopenia: Secondary | ICD-10-CM | POA: Diagnosis not present

## 2023-06-26 DIAGNOSIS — G928 Other toxic encephalopathy: Secondary | ICD-10-CM | POA: Diagnosis not present

## 2023-06-26 DIAGNOSIS — N390 Urinary tract infection, site not specified: Secondary | ICD-10-CM | POA: Diagnosis not present

## 2023-06-26 DIAGNOSIS — G309 Alzheimer's disease, unspecified: Secondary | ICD-10-CM | POA: Diagnosis not present

## 2023-06-26 DIAGNOSIS — R4182 Altered mental status, unspecified: Secondary | ICD-10-CM | POA: Diagnosis not present

## 2023-06-26 DIAGNOSIS — I129 Hypertensive chronic kidney disease with stage 1 through stage 4 chronic kidney disease, or unspecified chronic kidney disease: Secondary | ICD-10-CM | POA: Diagnosis not present

## 2023-06-26 DIAGNOSIS — N1831 Chronic kidney disease, stage 3a: Secondary | ICD-10-CM | POA: Diagnosis not present

## 2023-06-26 DIAGNOSIS — E1122 Type 2 diabetes mellitus with diabetic chronic kidney disease: Secondary | ICD-10-CM | POA: Diagnosis not present

## 2023-06-26 DIAGNOSIS — D6949 Other primary thrombocytopenia: Secondary | ICD-10-CM | POA: Diagnosis not present

## 2023-07-01 DIAGNOSIS — E538 Deficiency of other specified B group vitamins: Secondary | ICD-10-CM | POA: Diagnosis not present

## 2023-07-09 ENCOUNTER — Ambulatory Visit: Payer: Medicare HMO | Admitting: Podiatry

## 2023-07-09 DIAGNOSIS — M79674 Pain in right toe(s): Secondary | ICD-10-CM

## 2023-07-09 DIAGNOSIS — B351 Tinea unguium: Secondary | ICD-10-CM | POA: Diagnosis not present

## 2023-07-09 DIAGNOSIS — M79675 Pain in left toe(s): Secondary | ICD-10-CM | POA: Diagnosis not present

## 2023-07-09 NOTE — Progress Notes (Signed)
  Subjective:  Patient ID: Daniel Sherman, male    DOB: 20-Dec-1940,  MRN: [id number]  Chief Complaint  Patient presents with   Nail Problem    Nail trim    82 y.o. male returns for the above complaint.  Patient presents with thickened and again dystrophic mycotic toenails x 10 mild pain on palpation hurts with ambulation worse with pressure he would like for me debride at his will do it himself denies any other acute complaints  Objective:  There were no vitals filed for this visit. Podiatric Exam: Vascular: dorsalis pedis and posterior tibial pulses are palpable bilateral. Capillary return is immediate. Temperature gradient is WNL. Skin turgor WNL  Sensorium: Normal Semmes Weinstein monofilament test. Normal tactile sensation bilaterally. Nail Exam: Pt has thick disfigured discolored nails with subungual debris noted bilateral entire nail hallux through fifth toenails.  Pain on palpation to the nails. Ulcer Exam: There is no evidence of ulcer or pre-ulcerative changes or infection. Orthopedic Exam: Muscle tone and strength are WNL. No limitations in general ROM. No crepitus or effusions noted.  Skin: No Porokeratosis. No infection or ulcers    Assessment & Plan:   1. Pain due to onychomycosis of toenails of both feet     Patient was evaluated and treated and all questions answered.  Onychomycosis with pain  -Nails palliatively debrided as below. -Educated on self-care  Procedure: Nail Debridement Rationale: pain  Type of Debridement: manual, sharp debridement. Instrumentation: Nail nipper, rotary burr. Number of Nails: 10  Procedures and Treatment: Consent by patient was obtained for treatment procedures. The patient understood the discussion of treatment and procedures well. All questions were answered thoroughly reviewed. Debridement of mycotic and hypertrophic toenails, 1 through 5 bilateral and clearing of subungual debris. No ulceration, no infection noted.  Return  Visit-Office Procedure: Patient instructed to return to the office for a follow up visit 3 months for continued evaluation and treatment.  Nicholes Rough, DPM    No follow-ups on file.

## 2023-07-10 DIAGNOSIS — I129 Hypertensive chronic kidney disease with stage 1 through stage 4 chronic kidney disease, or unspecified chronic kidney disease: Secondary | ICD-10-CM | POA: Diagnosis not present

## 2023-07-10 DIAGNOSIS — D6949 Other primary thrombocytopenia: Secondary | ICD-10-CM | POA: Diagnosis not present

## 2023-07-10 DIAGNOSIS — E1122 Type 2 diabetes mellitus with diabetic chronic kidney disease: Secondary | ICD-10-CM | POA: Diagnosis not present

## 2023-07-10 DIAGNOSIS — N390 Urinary tract infection, site not specified: Secondary | ICD-10-CM | POA: Diagnosis not present

## 2023-07-10 DIAGNOSIS — G309 Alzheimer's disease, unspecified: Secondary | ICD-10-CM | POA: Diagnosis not present

## 2023-07-10 DIAGNOSIS — R4182 Altered mental status, unspecified: Secondary | ICD-10-CM | POA: Diagnosis not present

## 2023-07-10 DIAGNOSIS — D61818 Other pancytopenia: Secondary | ICD-10-CM | POA: Diagnosis not present

## 2023-07-10 DIAGNOSIS — N1831 Chronic kidney disease, stage 3a: Secondary | ICD-10-CM | POA: Diagnosis not present

## 2023-07-10 DIAGNOSIS — G928 Other toxic encephalopathy: Secondary | ICD-10-CM | POA: Diagnosis not present

## 2023-07-11 DIAGNOSIS — Z8669 Personal history of other diseases of the nervous system and sense organs: Secondary | ICD-10-CM | POA: Diagnosis not present

## 2023-07-11 DIAGNOSIS — F0280 Dementia in other diseases classified elsewhere without behavioral disturbance: Secondary | ICD-10-CM | POA: Diagnosis not present

## 2023-07-11 DIAGNOSIS — E538 Deficiency of other specified B group vitamins: Secondary | ICD-10-CM | POA: Diagnosis not present

## 2023-07-11 DIAGNOSIS — F0150 Vascular dementia without behavioral disturbance: Secondary | ICD-10-CM | POA: Diagnosis not present

## 2023-07-11 DIAGNOSIS — G309 Alzheimer's disease, unspecified: Secondary | ICD-10-CM | POA: Diagnosis not present

## 2023-07-11 DIAGNOSIS — F419 Anxiety disorder, unspecified: Secondary | ICD-10-CM | POA: Diagnosis not present

## 2023-08-03 DIAGNOSIS — F02811 Dementia in other diseases classified elsewhere, unspecified severity, with agitation: Secondary | ICD-10-CM | POA: Diagnosis not present

## 2023-08-03 DIAGNOSIS — F03918 Unspecified dementia, unspecified severity, with other behavioral disturbance: Secondary | ICD-10-CM | POA: Diagnosis not present

## 2023-08-03 DIAGNOSIS — E860 Dehydration: Secondary | ICD-10-CM | POA: Diagnosis not present

## 2023-08-03 DIAGNOSIS — N179 Acute kidney failure, unspecified: Secondary | ICD-10-CM | POA: Diagnosis not present

## 2023-08-03 DIAGNOSIS — G308 Other Alzheimer's disease: Secondary | ICD-10-CM | POA: Diagnosis not present

## 2023-08-03 DIAGNOSIS — Z79899 Other long term (current) drug therapy: Secondary | ICD-10-CM | POA: Diagnosis not present

## 2023-08-03 DIAGNOSIS — F0390 Unspecified dementia without behavioral disturbance: Secondary | ICD-10-CM | POA: Diagnosis not present

## 2023-08-03 DIAGNOSIS — F02818 Dementia in other diseases classified elsewhere, unspecified severity, with other behavioral disturbance: Secondary | ICD-10-CM | POA: Diagnosis not present

## 2023-08-03 DIAGNOSIS — G309 Alzheimer's disease, unspecified: Secondary | ICD-10-CM | POA: Diagnosis not present

## 2023-08-04 DIAGNOSIS — G309 Alzheimer's disease, unspecified: Secondary | ICD-10-CM | POA: Diagnosis not present

## 2023-08-04 DIAGNOSIS — F02818 Dementia in other diseases classified elsewhere, unspecified severity, with other behavioral disturbance: Secondary | ICD-10-CM | POA: Diagnosis not present

## 2023-08-05 DIAGNOSIS — F02818 Dementia in other diseases classified elsewhere, unspecified severity, with other behavioral disturbance: Secondary | ICD-10-CM | POA: Diagnosis not present

## 2023-08-05 DIAGNOSIS — G309 Alzheimer's disease, unspecified: Secondary | ICD-10-CM | POA: Diagnosis not present

## 2023-08-06 DIAGNOSIS — G309 Alzheimer's disease, unspecified: Secondary | ICD-10-CM | POA: Diagnosis not present

## 2023-08-06 DIAGNOSIS — F02818 Dementia in other diseases classified elsewhere, unspecified severity, with other behavioral disturbance: Secondary | ICD-10-CM | POA: Diagnosis not present

## 2023-08-06 DIAGNOSIS — F02811 Dementia in other diseases classified elsewhere, unspecified severity, with agitation: Secondary | ICD-10-CM | POA: Diagnosis not present

## 2023-08-06 DIAGNOSIS — G308 Other Alzheimer's disease: Secondary | ICD-10-CM | POA: Diagnosis not present

## 2023-08-07 DIAGNOSIS — G309 Alzheimer's disease, unspecified: Secondary | ICD-10-CM | POA: Diagnosis not present

## 2023-08-07 DIAGNOSIS — F02818 Dementia in other diseases classified elsewhere, unspecified severity, with other behavioral disturbance: Secondary | ICD-10-CM | POA: Diagnosis not present

## 2023-08-12 ENCOUNTER — Encounter (HOSPITAL_COMMUNITY): Payer: Self-pay

## 2023-08-12 ENCOUNTER — Other Ambulatory Visit: Payer: Self-pay

## 2023-08-12 ENCOUNTER — Inpatient Hospital Stay (HOSPITAL_COMMUNITY)
Admission: EM | Admit: 2023-08-12 | Discharge: 2023-08-15 | DRG: 871 | Disposition: A | Discharge status: Skilled Nursing Facility | Payer: Medicare HMO | Source: Skilled Nursing Facility | Attending: Internal Medicine | Admitting: Internal Medicine

## 2023-08-12 ENCOUNTER — Emergency Department (HOSPITAL_COMMUNITY): Payer: Medicare HMO

## 2023-08-12 DIAGNOSIS — A4189 Other specified sepsis: Principal | ICD-10-CM | POA: Diagnosis present

## 2023-08-12 DIAGNOSIS — I252 Old myocardial infarction: Secondary | ICD-10-CM

## 2023-08-12 DIAGNOSIS — D6949 Other primary thrombocytopenia: Secondary | ICD-10-CM | POA: Diagnosis present

## 2023-08-12 DIAGNOSIS — U071 COVID-19: Secondary | ICD-10-CM | POA: Diagnosis present

## 2023-08-12 DIAGNOSIS — Z96642 Presence of left artificial hip joint: Secondary | ICD-10-CM | POA: Diagnosis present

## 2023-08-12 DIAGNOSIS — E875 Hyperkalemia: Secondary | ICD-10-CM | POA: Diagnosis present

## 2023-08-12 DIAGNOSIS — E669 Obesity, unspecified: Secondary | ICD-10-CM | POA: Diagnosis present

## 2023-08-12 DIAGNOSIS — R0602 Shortness of breath: Secondary | ICD-10-CM | POA: Diagnosis not present

## 2023-08-12 DIAGNOSIS — Z79899 Other long term (current) drug therapy: Secondary | ICD-10-CM

## 2023-08-12 DIAGNOSIS — J168 Pneumonia due to other specified infectious organisms: Secondary | ICD-10-CM | POA: Diagnosis not present

## 2023-08-12 DIAGNOSIS — J189 Pneumonia, unspecified organism: Secondary | ICD-10-CM | POA: Diagnosis present

## 2023-08-12 DIAGNOSIS — R161 Splenomegaly, not elsewhere classified: Secondary | ICD-10-CM | POA: Diagnosis not present

## 2023-08-12 DIAGNOSIS — D631 Anemia in chronic kidney disease: Secondary | ICD-10-CM | POA: Diagnosis present

## 2023-08-12 DIAGNOSIS — N1832 Chronic kidney disease, stage 3b: Secondary | ICD-10-CM | POA: Diagnosis present

## 2023-08-12 DIAGNOSIS — N189 Chronic kidney disease, unspecified: Secondary | ICD-10-CM | POA: Diagnosis present

## 2023-08-12 DIAGNOSIS — K219 Gastro-esophageal reflux disease without esophagitis: Secondary | ICD-10-CM | POA: Diagnosis present

## 2023-08-12 DIAGNOSIS — I129 Hypertensive chronic kidney disease with stage 1 through stage 4 chronic kidney disease, or unspecified chronic kidney disease: Secondary | ICD-10-CM | POA: Diagnosis present

## 2023-08-12 DIAGNOSIS — I10 Essential (primary) hypertension: Secondary | ICD-10-CM | POA: Diagnosis present

## 2023-08-12 DIAGNOSIS — E1122 Type 2 diabetes mellitus with diabetic chronic kidney disease: Secondary | ICD-10-CM | POA: Diagnosis present

## 2023-08-12 DIAGNOSIS — R6520 Severe sepsis without septic shock: Secondary | ICD-10-CM | POA: Diagnosis not present

## 2023-08-12 DIAGNOSIS — Z8249 Family history of ischemic heart disease and other diseases of the circulatory system: Secondary | ICD-10-CM

## 2023-08-12 DIAGNOSIS — Z7189 Other specified counseling: Secondary | ICD-10-CM

## 2023-08-12 DIAGNOSIS — J9621 Acute and chronic respiratory failure with hypoxia: Secondary | ICD-10-CM | POA: Diagnosis present

## 2023-08-12 DIAGNOSIS — G301 Alzheimer's disease with late onset: Secondary | ICD-10-CM | POA: Diagnosis present

## 2023-08-12 DIAGNOSIS — Z820 Family history of epilepsy and other diseases of the nervous system: Secondary | ICD-10-CM

## 2023-08-12 DIAGNOSIS — E785 Hyperlipidemia, unspecified: Secondary | ICD-10-CM | POA: Diagnosis present

## 2023-08-12 DIAGNOSIS — Z85038 Personal history of other malignant neoplasm of large intestine: Secondary | ICD-10-CM

## 2023-08-12 DIAGNOSIS — J9811 Atelectasis: Secondary | ICD-10-CM | POA: Diagnosis not present

## 2023-08-12 DIAGNOSIS — R059 Cough, unspecified: Secondary | ICD-10-CM | POA: Diagnosis not present

## 2023-08-12 DIAGNOSIS — I2510 Atherosclerotic heart disease of native coronary artery without angina pectoris: Secondary | ICD-10-CM | POA: Diagnosis present

## 2023-08-12 DIAGNOSIS — N179 Acute kidney failure, unspecified: Secondary | ICD-10-CM | POA: Diagnosis present

## 2023-08-12 DIAGNOSIS — A419 Sepsis, unspecified organism: Secondary | ICD-10-CM

## 2023-08-12 DIAGNOSIS — Z87891 Personal history of nicotine dependence: Secondary | ICD-10-CM

## 2023-08-12 DIAGNOSIS — E119 Type 2 diabetes mellitus without complications: Secondary | ICD-10-CM

## 2023-08-12 DIAGNOSIS — Z8616 Personal history of COVID-19: Secondary | ICD-10-CM | POA: Diagnosis not present

## 2023-08-12 DIAGNOSIS — I4891 Unspecified atrial fibrillation: Secondary | ICD-10-CM | POA: Diagnosis not present

## 2023-08-12 DIAGNOSIS — F0280 Dementia in other diseases classified elsewhere without behavioral disturbance: Secondary | ICD-10-CM | POA: Diagnosis present

## 2023-08-12 DIAGNOSIS — Z6831 Body mass index (BMI) 31.0-31.9, adult: Secondary | ICD-10-CM

## 2023-08-12 DIAGNOSIS — R918 Other nonspecific abnormal finding of lung field: Secondary | ICD-10-CM | POA: Diagnosis not present

## 2023-08-12 DIAGNOSIS — G9389 Other specified disorders of brain: Secondary | ICD-10-CM | POA: Diagnosis not present

## 2023-08-12 DIAGNOSIS — Z66 Do not resuscitate: Secondary | ICD-10-CM | POA: Diagnosis present

## 2023-08-12 DIAGNOSIS — R0989 Other specified symptoms and signs involving the circulatory and respiratory systems: Secondary | ICD-10-CM | POA: Diagnosis not present

## 2023-08-12 DIAGNOSIS — J9601 Acute respiratory failure with hypoxia: Secondary | ICD-10-CM | POA: Diagnosis not present

## 2023-08-12 DIAGNOSIS — Z9981 Dependence on supplemental oxygen: Secondary | ICD-10-CM

## 2023-08-12 DIAGNOSIS — E860 Dehydration: Secondary | ICD-10-CM | POA: Diagnosis present

## 2023-08-12 DIAGNOSIS — J984 Other disorders of lung: Secondary | ICD-10-CM | POA: Diagnosis not present

## 2023-08-12 DIAGNOSIS — Z7401 Bed confinement status: Secondary | ICD-10-CM | POA: Diagnosis not present

## 2023-08-12 DIAGNOSIS — I6782 Cerebral ischemia: Secondary | ICD-10-CM | POA: Diagnosis not present

## 2023-08-12 DIAGNOSIS — R001 Bradycardia, unspecified: Secondary | ICD-10-CM | POA: Diagnosis not present

## 2023-08-12 DIAGNOSIS — R4182 Altered mental status, unspecified: Secondary | ICD-10-CM | POA: Diagnosis not present

## 2023-08-12 DIAGNOSIS — R000 Tachycardia, unspecified: Secondary | ICD-10-CM | POA: Diagnosis not present

## 2023-08-12 DIAGNOSIS — Z7984 Long term (current) use of oral hypoglycemic drugs: Secondary | ICD-10-CM

## 2023-08-12 DIAGNOSIS — R140 Abdominal distension (gaseous): Secondary | ICD-10-CM | POA: Diagnosis not present

## 2023-08-12 DIAGNOSIS — R509 Fever, unspecified: Secondary | ICD-10-CM | POA: Diagnosis not present

## 2023-08-12 NOTE — ED Triage Notes (Signed)
PER EMS report:   Pt arrives from [facility] (only been there for 3 days) called out for breathing difficulty. Dementia, a/o x 1, Temp 99.3, BP 142/70, hr 108, 18rr, 95% ra, cbg 131. Wet, non productive cough.

## 2023-08-12 NOTE — ED Provider Notes (Signed)
[facility] DE[facility] AT [facility] Provider Note   CSN: [id number] Arrival date & time: 08/12/23  2043     History {Add pertinent medical, surgical, social history, OB history to HPI:1} No chief complaint on file.   Daniel Sherman is a 82 y.o. male.  HPI   Patient with medical history including hypertension, diabetes, dementia, history of colon cancer, chronic respiratory failure on 2 L via nasal cannula presenting from [facility].  Unable to obtain information from patient's but was able to speak with nursing staff who states that patient started develop a cough today as well as having fevers chills and elevated blood pressure, other concern for infection they sent him here.  No known recent falls, has been eating drinking out difficulty, states he is at his current baseline.    Home Medications Prior to Admission medications   Medication Sig Start Date End Date Taking? Authorizing Provider  benzonatate (TESSALON) 200 MG capsule Take 200 mg by mouth 3 (three) times daily as needed. 03/14/23   [provider]  colestipol (COLESTID) 1 g tablet Take 1 g by mouth 2 (two) times daily.    [provider]  donepezil (ARICEPT) 10 MG tablet Take 10 mg by mouth daily. 04/21/21   [provider]  escitalopram (LEXAPRO) 5 MG tablet Take 5 mg by mouth in the morning and at bedtime. 08/14/21 05/10/23  [provider]  gabapentin (NEURONTIN) 300 MG capsule Take 300 mg by mouth daily. 09/12/22   [provider]  hydrochlorothiazide (HYDRODIURIL) 12.5 MG tablet Take 12.5 mg by mouth daily. 03/06/23   [provider]  Iron Combinations (NIFEREX) TABS Take by mouth. 11/14/22   [provider]  linagliptin (TRADJENTA) 5 MG TABS tablet Take 5 mg by mouth daily. 09/05/21 05/10/23  [provider]  lisinopril (PRINIVIL,ZESTRIL) 40 MG tablet Take 40 mg by mouth every evening.    [provider]  memantine  (NAMENDA) 10 MG tablet Take 10 mg by mouth 2 (two) times daily.    [provider]  metFORMIN (GLUCOPHAGE) 1000 MG tablet Take 1 tablet (1,000 mg total) by mouth daily with breakfast. 05/13/23   Wouk, Wilfred Curtis, MD  simvastatin (ZOCOR) 40 MG tablet Take 40 mg by mouth every evening.    [provider]  VITAMIN D PO Take 1 tablet by mouth daily.    [provider]      Allergies    Other    Review of Systems   Review of Systems  Unable to perform ROS: Dementia    Physical Exam Updated Vital Signs BP 131/66 (BP Location: Left Arm)   Pulse 62   Temp 98.1 F (36.7 C) (Oral)   Resp 17   SpO2 98%  Physical Exam Vitals and nursing note reviewed.  Constitutional:      General: He is not in acute distress.    Appearance: He is ill-appearing and toxic-appearing.  HENT:     Head: Normocephalic and atraumatic.     Nose: No congestion.     Mouth/Throat:     Mouth: Mucous membranes are moist.     Pharynx: Oropharynx is clear.  Eyes:     Extraocular Movements: Extraocular movements intact.     Conjunctiva/sclera: Conjunctivae normal.     Pupils: Pupils are equal, round, and reactive to light.  Cardiovascular:     Rate and Rhythm: Regular rhythm. Tachycardia present.     Pulses: Normal pulses.  Heart sounds: No murmur heard.    No friction rub. No gallop.  Pulmonary:     Effort: No respiratory distress.     Breath sounds: No wheezing, rhonchi or rales.     Comments: On 5 L via nasal cannula, no evidence of acute respiratory distress no assessor muscle usage, patient has coarse sounding lungs with audible rhonchi, no noted Rales or stridor present. Abdominal:     Palpations: Abdomen is soft.     Tenderness: There is no abdominal tenderness. There is no right CVA tenderness or left CVA tenderness.  Skin:    General: Skin is warm and dry.  Neurological:     Mental Status: He is alert.     Comments: No obvious facial asymmetry, no obvious unilateral  weakness.  Exam is limited as patient has dementia and will not follow commands.  Psychiatric:        Mood and Affect: Mood normal.     ED Results / Procedures / Treatments   Labs (all labs ordered are listed, but only abnormal results are displayed) Labs Reviewed  CULTURE, BLOOD (ROUTINE X 2)  CULTURE, BLOOD (ROUTINE X 2)  COMPREHENSIVE METABOLIC PANEL  CBC WITH DIFFERENTIAL/PLATELET  PROTIME-INR  APTT  URINALYSIS, W/ REFLEX TO CULTURE (INFECTION SUSPECTED)  BRAIN NATRIURETIC PEPTIDE  I-STAT CG4 LACTIC ACID, ED    EKG None  Radiology No results found.  Procedures Procedures  {Document cardiac monitor, telemetry assessment procedure when appropriate:1}  Medications Ordered in ED Medications - No data to display  ED Course/ Medical Decision Making/ A&P   {   Click here for ABCD2, HEART and other calculatorsREFRESH Note before signing :1}                              Medical Decision Making Amount and/or Complexity of Data Reviewed Labs: ordered. Radiology: ordered. ECG/medicine tests: ordered.   This patient presents to the ED for concern of shortness of breath, this involves an extensive number of treatment options, and is a complaint that carries with it a high risk of complications and morbidity.  The differential diagnosis includes CHF, ACS, PE, pneumonia, sepsis    Additional history obtained:  Additional history obtained from [facility] nursing staff External records from outside source obtained and reviewed including cardiology notes   Co morbidities that complicate the patient evaluation  Chronic respiratory failure, diabetes, hypertension,  Social Determinants of Health:  Geriatric    Lab Tests:  I Ordered, and personally interpreted labs.  The pertinent results include:  ***   Imaging Studies ordered:  I ordered imaging studies including chest x-ray, CT head I independently visualized and interpreted imaging which showed *** I  agree with the radiologist interpretation   Cardiac Monitoring:  The patient was maintained on a cardiac monitor.  I personally viewed and interpreted the cardiac monitored which showed an underlying rhythm of: Sinus tachycardia without signs of ischemia   Medicines ordered and prescription drug management:  I ordered medication including ***  for ***  I have reviewed the patients home medicines and have made adjustments as needed  Critical Interventions:  ***   Reevaluation:  Presents with shortness of breath, patient is requiring 5 L via nasal cannula, he is toxic appearing, he is tachycardic, concern for septic etiology, will obtain sepsis workup continue to monitor.    Consultations Obtained:  I requested consultation with the ***,  and discussed lab and imaging findings  as well as pertinent plan - they recommend: ***    Test Considered:  ***    Rule out ****    Dispostion and problem list  After consideration of the diagnostic results and the patients response to treatment, I feel that the patent would benefit from ***.       {Document critical care time when appropriate:1} {Document review of labs and clinical decision tools ie heart score, Chads2Vasc2 etc:1}  {Document your independent review of radiology images, and any outside records:1} {Document your discussion with family members, caretakers, and with consultants:1} {Document social determinants of health affecting pt's care:1} {Document your decision making why or why not admission, treatments were needed:1} Final Clinical Impression(s) / ED Diagnoses Final diagnoses:  None    Rx / DC Orders ED Discharge Orders     None

## 2023-08-13 ENCOUNTER — Emergency Department (HOSPITAL_COMMUNITY): Payer: Medicare HMO

## 2023-08-13 ENCOUNTER — Inpatient Hospital Stay (HOSPITAL_COMMUNITY): Payer: Medicare HMO

## 2023-08-13 ENCOUNTER — Encounter (HOSPITAL_COMMUNITY): Payer: Self-pay | Admitting: Internal Medicine

## 2023-08-13 DIAGNOSIS — R140 Abdominal distension (gaseous): Secondary | ICD-10-CM | POA: Diagnosis not present

## 2023-08-13 DIAGNOSIS — F0280 Dementia in other diseases classified elsewhere without behavioral disturbance: Secondary | ICD-10-CM | POA: Diagnosis present

## 2023-08-13 DIAGNOSIS — N179 Acute kidney failure, unspecified: Secondary | ICD-10-CM | POA: Diagnosis present

## 2023-08-13 DIAGNOSIS — J189 Pneumonia, unspecified organism: Secondary | ICD-10-CM | POA: Diagnosis present

## 2023-08-13 DIAGNOSIS — U071 COVID-19: Secondary | ICD-10-CM | POA: Diagnosis present

## 2023-08-13 DIAGNOSIS — K219 Gastro-esophageal reflux disease without esophagitis: Secondary | ICD-10-CM | POA: Diagnosis present

## 2023-08-13 DIAGNOSIS — G301 Alzheimer's disease with late onset: Secondary | ICD-10-CM | POA: Diagnosis present

## 2023-08-13 DIAGNOSIS — E875 Hyperkalemia: Secondary | ICD-10-CM | POA: Diagnosis present

## 2023-08-13 DIAGNOSIS — Z8249 Family history of ischemic heart disease and other diseases of the circulatory system: Secondary | ICD-10-CM | POA: Diagnosis not present

## 2023-08-13 DIAGNOSIS — E785 Hyperlipidemia, unspecified: Secondary | ICD-10-CM | POA: Diagnosis present

## 2023-08-13 DIAGNOSIS — Z8616 Personal history of COVID-19: Secondary | ICD-10-CM | POA: Diagnosis not present

## 2023-08-13 DIAGNOSIS — I129 Hypertensive chronic kidney disease with stage 1 through stage 4 chronic kidney disease, or unspecified chronic kidney disease: Secondary | ICD-10-CM | POA: Diagnosis present

## 2023-08-13 DIAGNOSIS — Z79899 Other long term (current) drug therapy: Secondary | ICD-10-CM | POA: Diagnosis not present

## 2023-08-13 DIAGNOSIS — R0602 Shortness of breath: Secondary | ICD-10-CM | POA: Diagnosis not present

## 2023-08-13 DIAGNOSIS — Z66 Do not resuscitate: Secondary | ICD-10-CM | POA: Diagnosis present

## 2023-08-13 DIAGNOSIS — A4189 Other specified sepsis: Secondary | ICD-10-CM | POA: Diagnosis present

## 2023-08-13 DIAGNOSIS — I252 Old myocardial infarction: Secondary | ICD-10-CM | POA: Diagnosis not present

## 2023-08-13 DIAGNOSIS — I2510 Atherosclerotic heart disease of native coronary artery without angina pectoris: Secondary | ICD-10-CM | POA: Diagnosis present

## 2023-08-13 DIAGNOSIS — J9621 Acute and chronic respiratory failure with hypoxia: Secondary | ICD-10-CM | POA: Diagnosis present

## 2023-08-13 DIAGNOSIS — Z7984 Long term (current) use of oral hypoglycemic drugs: Secondary | ICD-10-CM | POA: Diagnosis not present

## 2023-08-13 DIAGNOSIS — R918 Other nonspecific abnormal finding of lung field: Secondary | ICD-10-CM | POA: Diagnosis not present

## 2023-08-13 DIAGNOSIS — D631 Anemia in chronic kidney disease: Secondary | ICD-10-CM | POA: Diagnosis present

## 2023-08-13 DIAGNOSIS — I4891 Unspecified atrial fibrillation: Secondary | ICD-10-CM

## 2023-08-13 DIAGNOSIS — E1122 Type 2 diabetes mellitus with diabetic chronic kidney disease: Secondary | ICD-10-CM | POA: Diagnosis present

## 2023-08-13 DIAGNOSIS — D6949 Other primary thrombocytopenia: Secondary | ICD-10-CM | POA: Diagnosis present

## 2023-08-13 DIAGNOSIS — E860 Dehydration: Secondary | ICD-10-CM | POA: Diagnosis present

## 2023-08-13 DIAGNOSIS — N1832 Chronic kidney disease, stage 3b: Secondary | ICD-10-CM | POA: Diagnosis present

## 2023-08-13 DIAGNOSIS — J9601 Acute respiratory failure with hypoxia: Secondary | ICD-10-CM | POA: Diagnosis not present

## 2023-08-13 DIAGNOSIS — E669 Obesity, unspecified: Secondary | ICD-10-CM | POA: Diagnosis present

## 2023-08-13 LAB — COMPREHENSIVE METABOLIC PANEL
ALT: 31 U/L (ref 0–44)
AST: 45 U/L — ABNORMAL HIGH (ref 15–41)
Albumin: 3.5 g/dL (ref 3.5–5.0)
Alkaline Phosphatase: 63 U/L (ref 38–126)
Anion gap: 11 (ref 5–15)
BUN: 70 mg/dL — ABNORMAL HIGH (ref 8–23)
CO2: 19 mmol/L — ABNORMAL LOW (ref 22–32)
Calcium: 8.6 mg/dL — ABNORMAL LOW (ref 8.9–10.3)
Chloride: 105 mmol/L (ref 98–111)
Creatinine, Ser: 2.63 mg/dL — ABNORMAL HIGH (ref 0.61–1.24)
GFR, Estimated: 24 mL/min — ABNORMAL LOW (ref >60)
Glucose, Bld: 115 mg/dL — ABNORMAL HIGH (ref 70–99)
Potassium: 5.2 mmol/L — ABNORMAL HIGH (ref 3.5–5.1)
Sodium: 135 mmol/L (ref 135–145)
Total Bilirubin: 0.7 mg/dL (ref 0.3–1.2)
Total Protein: 7.2 g/dL (ref 6.5–8.1)

## 2023-08-13 LAB — CBC WITH DIFFERENTIAL/PLATELET
Abs Immature Granulocytes: 0.02 10*3/uL (ref 0.00–0.07)
Basophils Absolute: 0 10*3/uL (ref 0.0–0.1)
Basophils Relative: 0 %
Eosinophils Absolute: 0 10*3/uL (ref 0.0–0.5)
Eosinophils Relative: 0 %
HCT: 36.5 % — ABNORMAL LOW (ref 39.0–52.0)
Hemoglobin: 11.5 g/dL — ABNORMAL LOW (ref 13.0–17.0)
Immature Granulocytes: 0 %
Lymphocytes Relative: 10 %
Lymphs Abs: 0.6 10*3/uL — ABNORMAL LOW (ref 0.7–4.0)
MCH: 26.9 pg (ref 26.0–34.0)
MCHC: 31.5 g/dL (ref 30.0–36.0)
MCV: 85.3 fL (ref 80.0–100.0)
Monocytes Absolute: 0.5 10*3/uL (ref 0.1–1.0)
Monocytes Relative: 9 %
Neutro Abs: 4.5 10*3/uL (ref 1.7–7.7)
Neutrophils Relative %: 81 %
Platelets: 93 10*3/uL — ABNORMAL LOW (ref 150–400)
RBC: 4.28 MIL/uL (ref 4.22–5.81)
RDW: 13.9 % (ref 11.5–15.5)
WBC: 5.5 10*3/uL (ref 4.0–10.5)
nRBC: 0 % (ref 0.0–0.2)

## 2023-08-13 LAB — ECHOCARDIOGRAM COMPLETE
AR max vel: 3.1 cm2
AV Area VTI: 3.07 cm2
AV Area mean vel: 2.59 cm2
AV Mean grad: 5 mmHg
AV Peak grad: 8.1 mmHg
Ao pk vel: 1.42 m/s
Area-P 1/2: 4.8 cm2
MV VTI: 4.47 cm2
S' Lateral: 2 cm

## 2023-08-13 LAB — URINALYSIS, W/ REFLEX TO CULTURE (INFECTION SUSPECTED)
Bacteria, UA: NONE SEEN
Bilirubin Urine: NEGATIVE
Glucose, UA: NEGATIVE mg/dL
Ketones, ur: 5 mg/dL — AB
Leukocytes,Ua: NEGATIVE
Nitrite: NEGATIVE
Protein, ur: NEGATIVE mg/dL
Specific Gravity, Urine: 1.015 (ref 1.005–1.030)
pH: 5 (ref 5.0–8.0)

## 2023-08-13 LAB — RESP PANEL BY RT-PCR (RSV, FLU A&B, COVID)  RVPGX2
Influenza A by PCR: NEGATIVE
Influenza B by PCR: NEGATIVE
Resp Syncytial Virus by PCR: NEGATIVE
SARS Coronavirus 2 by RT PCR: POSITIVE — AB

## 2023-08-13 LAB — BASIC METABOLIC PANEL
Anion gap: 11 (ref 5–15)
BUN: 64 mg/dL — ABNORMAL HIGH (ref 8–23)
CO2: 16 mmol/L — ABNORMAL LOW (ref 22–32)
Calcium: 7.4 mg/dL — ABNORMAL LOW (ref 8.9–10.3)
Chloride: 108 mmol/L (ref 98–111)
Creatinine, Ser: 2.26 mg/dL — ABNORMAL HIGH (ref 0.61–1.24)
GFR, Estimated: 28 mL/min — ABNORMAL LOW (ref >60)
Glucose, Bld: 140 mg/dL — ABNORMAL HIGH (ref 70–99)
Potassium: 4.7 mmol/L (ref 3.5–5.1)
Sodium: 135 mmol/L (ref 135–145)

## 2023-08-13 LAB — PROCALCITONIN: Procalcitonin: 0.66 ng/mL

## 2023-08-13 LAB — BLOOD GAS, VENOUS
Acid-base deficit: 7.2 mmol/L — ABNORMAL HIGH (ref 0.0–2.0)
Bicarbonate: 19.7 mmol/L — ABNORMAL LOW (ref 20.0–28.0)
O2 Saturation: 35.8 %
Patient temperature: 37
pCO2, Ven: 44 mmHg (ref 44–60)
pH, Ven: 7.26 (ref 7.25–7.43)
pO2, Ven: <31 mmHg — CL (ref 32–45)

## 2023-08-13 LAB — BRAIN NATRIURETIC PEPTIDE: B Natriuretic Peptide: 13.7 pg/mL (ref 0.0–100.0)

## 2023-08-13 LAB — PROTIME-INR
INR: 1.1 (ref 0.8–1.2)
Prothrombin Time: 14.2 s (ref 11.4–15.2)

## 2023-08-13 LAB — CBG MONITORING, ED: Glucose-Capillary: 91 mg/dL (ref 70–99)

## 2023-08-13 LAB — GLUCOSE, CAPILLARY: Glucose-Capillary: 141 mg/dL — ABNORMAL HIGH (ref 70–99)

## 2023-08-13 LAB — APTT: aPTT: 31 s (ref 24–36)

## 2023-08-13 LAB — I-STAT CG4 LACTIC ACID, ED
Lactic Acid, Venous: 0.7 mmol/L (ref 0.5–1.9)
Lactic Acid, Venous: 1 mmol/L (ref 0.5–1.9)

## 2023-08-13 MED ORDER — SODIUM CHLORIDE 0.9 % IV SOLN
200.0000 mg | Freq: Once | INTRAVENOUS | Status: AC
  Administered 2023-08-13: 200 mg via INTRAVENOUS
  Filled 2023-08-13: qty 40

## 2023-08-13 MED ORDER — INSULIN ASPART 100 UNIT/ML IJ SOLN
0.0000 [IU] | Freq: Three times a day (TID) | INTRAMUSCULAR | Status: DC
  Administered 2023-08-14: 4 [IU] via SUBCUTANEOUS
  Administered 2023-08-14 – 2023-08-15 (×2): 3 [IU] via SUBCUTANEOUS
  Administered 2023-08-15: 4 [IU] via SUBCUTANEOUS
  Filled 2023-08-13: qty 0.2

## 2023-08-13 MED ORDER — IPRATROPIUM-ALBUTEROL 0.5-2.5 (3) MG/3ML IN SOLN
3.0000 mL | RESPIRATORY_TRACT | Status: DC | PRN
  Administered 2023-08-13: 3 mL via RESPIRATORY_TRACT
  Filled 2023-08-13: qty 3

## 2023-08-13 MED ORDER — ONDANSETRON HCL 4 MG PO TABS: 4.0000 mg | ORAL_TABLET | Freq: Four times a day (QID) | ORAL | Status: DC | PRN

## 2023-08-13 MED ORDER — OLANZAPINE 5 MG PO TBDP
5.0000 mg | ORAL_TABLET | Freq: Every day | ORAL | Status: DC
  Administered 2023-08-14 – 2023-08-15 (×2): 5 mg via ORAL
  Filled 2023-08-13 (×3): qty 1

## 2023-08-13 MED ORDER — GABAPENTIN 300 MG PO CAPS
300.0000 mg | ORAL_CAPSULE | Freq: Every day | ORAL | Status: DC
  Administered 2023-08-14 – 2023-08-15 (×2): 300 mg via ORAL
  Filled 2023-08-13 (×2): qty 1

## 2023-08-13 MED ORDER — VITAMIN D 25 MCG (1000 UNIT) PO TABS
1000.0000 [IU] | ORAL_TABLET | Freq: Every day | ORAL | Status: DC
  Administered 2023-08-14 – 2023-08-15 (×2): 1000 [IU] via ORAL
  Filled 2023-08-13 (×2): qty 1

## 2023-08-13 MED ORDER — COLESEVELAM HCL 625 MG PO TABS
1875.0000 mg | ORAL_TABLET | Freq: Two times a day (BID) | ORAL | Status: DC
  Administered 2023-08-14 – 2023-08-15 (×4): 1875 mg via ORAL
  Filled 2023-08-13 (×6): qty 3

## 2023-08-13 MED ORDER — SODIUM CHLORIDE 0.9 % IV BOLUS
1000.0000 mL | Freq: Once | INTRAVENOUS | Status: AC
  Administered 2023-08-13: 1000 mL via INTRAVENOUS

## 2023-08-13 MED ORDER — SODIUM CHLORIDE 0.9 % IV SOLN
100.0000 mg | Freq: Every day | INTRAVENOUS | Status: AC
  Administered 2023-08-14 – 2023-08-15 (×2): 100 mg via INTRAVENOUS
  Filled 2023-08-13 (×2): qty 20

## 2023-08-13 MED ORDER — INSULIN ASPART 100 UNIT/ML IJ SOLN
0.0000 [IU] | Freq: Three times a day (TID) | INTRAMUSCULAR | Status: DC
  Filled 2023-08-13: qty 0.09

## 2023-08-13 MED ORDER — ONDANSETRON HCL 4 MG/2ML IJ SOLN: 4.0000 mg | Freq: Four times a day (QID) | INTRAMUSCULAR | Status: DC | PRN

## 2023-08-13 MED ORDER — FERROUS SULFATE 325 (65 FE) MG PO TABS
325.0000 mg | ORAL_TABLET | Freq: Every day | ORAL | Status: DC
  Administered 2023-08-14 – 2023-08-15 (×2): 325 mg via ORAL
  Filled 2023-08-13 (×2): qty 1

## 2023-08-13 MED ORDER — DEXAMETHASONE SODIUM PHOSPHATE 10 MG/ML IJ SOLN
6.0000 mg | INTRAMUSCULAR | Status: DC
  Administered 2023-08-13 – 2023-08-14 (×2): 6 mg via INTRAVENOUS
  Filled 2023-08-13 (×2): qty 1

## 2023-08-13 MED ORDER — VANCOMYCIN HCL IN DEXTROSE 1-5 GM/200ML-% IV SOLN: 1000.0000 mg | Freq: Once | INTRAVENOUS | Status: DC

## 2023-08-13 MED ORDER — DONEPEZIL HCL 5 MG PO TABS
10.0000 mg | ORAL_TABLET | Freq: Every day | ORAL | Status: DC
  Administered 2023-08-14 – 2023-08-15 (×2): 10 mg via ORAL
  Filled 2023-08-13 (×2): qty 2

## 2023-08-13 MED ORDER — VANCOMYCIN HCL 1500 MG/300ML IV SOLN
1500.0000 mg | Freq: Once | INTRAVENOUS | Status: AC
  Administered 2023-08-13: 1500 mg via INTRAVENOUS
  Filled 2023-08-13: qty 300

## 2023-08-13 MED ORDER — ENOXAPARIN SODIUM 30 MG/0.3ML IJ SOSY: 30.0000 mg | PREFILLED_SYRINGE | INTRAMUSCULAR | Status: DC

## 2023-08-13 MED ORDER — METRONIDAZOLE 500 MG/100ML IV SOLN
500.0000 mg | Freq: Once | INTRAVENOUS | Status: AC
  Administered 2023-08-13: 500 mg via INTRAVENOUS
  Filled 2023-08-13: qty 100

## 2023-08-13 MED ORDER — ESCITALOPRAM OXALATE 10 MG PO TABS
10.0000 mg | ORAL_TABLET | Freq: Two times a day (BID) | ORAL | Status: DC
  Administered 2023-08-14 – 2023-08-15 (×4): 10 mg via ORAL
  Filled 2023-08-13 (×5): qty 1

## 2023-08-13 MED ORDER — LACTATED RINGERS IV SOLN: INTRAVENOUS | Status: AC

## 2023-08-13 MED ORDER — ACETAMINOPHEN 650 MG RE SUPP: 650.0000 mg | Freq: Four times a day (QID) | RECTAL | Status: DC | PRN

## 2023-08-13 MED ORDER — SODIUM CHLORIDE 0.9 % IV SOLN
500.0000 mg | INTRAVENOUS | Status: DC
  Administered 2023-08-13 – 2023-08-15 (×3): 500 mg via INTRAVENOUS
  Filled 2023-08-13 (×3): qty 5

## 2023-08-13 MED ORDER — ATORVASTATIN CALCIUM 10 MG PO TABS
20.0000 mg | ORAL_TABLET | Freq: Every day | ORAL | Status: DC
  Administered 2023-08-14 – 2023-08-15 (×2): 20 mg via ORAL
  Filled 2023-08-13 (×2): qty 2

## 2023-08-13 MED ORDER — ACETAMINOPHEN 325 MG PO TABS: 650.0000 mg | ORAL_TABLET | Freq: Four times a day (QID) | ORAL | Status: DC | PRN

## 2023-08-13 MED ORDER — DIVALPROEX SODIUM 250 MG PO DR TAB
250.0000 mg | DELAYED_RELEASE_TABLET | Freq: Three times a day (TID) | ORAL | Status: DC
  Administered 2023-08-14 – 2023-08-15 (×6): 250 mg via ORAL
  Filled 2023-08-13 (×7): qty 1

## 2023-08-13 MED ORDER — ACETAMINOPHEN 160 MG/5ML PO SOLN
650.0000 mg | Freq: Once | ORAL | Status: AC
  Administered 2023-08-13: 650 mg via ORAL
  Filled 2023-08-13: qty 20.3

## 2023-08-13 MED ORDER — SODIUM CHLORIDE 0.9 % IV SOLN
2.0000 g | INTRAVENOUS | Status: DC
  Administered 2023-08-13 – 2023-08-14 (×2): 2 g via INTRAVENOUS
  Filled 2023-08-13 (×2): qty 20

## 2023-08-13 MED ORDER — SODIUM CHLORIDE 0.9 % IV SOLN
2.0000 g | Freq: Once | INTRAVENOUS | Status: AC
  Administered 2023-08-13: 2 g via INTRAVENOUS
  Filled 2023-08-13: qty 12.5

## 2023-08-13 NOTE — Sepsis Progress Note (Signed)
Elink following for sepsis protocol. 

## 2023-08-13 NOTE — Progress Notes (Signed)
*  PRELIMINARY RESULTS* Echocardiogram 2D Echocardiogram has been performed.  Daniel Sherman 08/13/2023, 2:24 PM
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
## 2023-08-13 NOTE — Progress Notes (Signed)
A consult was received from an ED physician for Vancomycin and Cefepime per pharmacy dosing.  The patient's profile has been reviewed for ht/wt/allergies/indication/available labs.    A one time order has been placed for Vancomycin 1500mg  IV and Cefepime 2gm IV.    Further antibiotics/pharmacy consults should be ordered by admitting physician if indicated.                       Thank you, Maryellen Pile, PharmD 08/13/2023  12:53 AM

## 2023-08-13 NOTE — Progress Notes (Signed)
   08/13/23 1258  Assess: MEWS Score  Temp (!) 101.2 F (38.4 C)  BP (!) 142/64  MAP (mmHg) 84  Pulse Rate (!) 113  Resp 20  SpO2 100 %  O2 Device Nasal Cannula  O2 Flow Rate (L/min) 5 L/min  Assess: MEWS Score  MEWS Temp 1  MEWS Systolic 0  MEWS Pulse 2  MEWS RR 0  MEWS LOC 1  MEWS Score 4  MEWS Score Color Red  Assess: if the MEWS score is Yellow or Red  Were vital signs accurate and taken at a resting state? No, vital signs rechecked  Does the patient meet 2 or more of the SIRS criteria? Yes  Does the patient have a confirmed or suspected source of infection? Yes  MEWS guidelines implemented  Yes, red  Treat  MEWS Interventions Considered administering scheduled or prn medications/treatments as ordered  Take Vital Signs  Increase Vital Sign Frequency  Red: Q1hr x2, continue Q4hrs until patient remains green for 12hrs  Escalate  MEWS: Escalate Red: Discuss with charge nurse and notify provider. Consider notifying RRT. If remains red for 2 hours consider need for higher level of care  Notify: Charge Nurse/RN  Name of Charge Nurse/RN Notified [place], RN  Provider Notification  Provider Name/Title Dr. Robb Matar  Date Provider Notified 08/13/23  Time Provider Notified 1320  Method of Notification Page (secure chat)  Notification Reason Other (Comment)  Provider response Other (Comment) (previously ordered breathing treatment)  Date of Provider Response 08/13/23  Time of Provider Response 1323  Assess: SIRS CRITERIA  SIRS Temperature  1  SIRS Pulse 1  SIRS Respirations  0  SIRS WBC 0  SIRS Score Sum  2   After giving patient ordered medications and prn breathing treatment, patient's breathing noted to be more even and unlabored.  Levora Angel, RN

## 2023-08-14 DIAGNOSIS — J9601 Acute respiratory failure with hypoxia: Secondary | ICD-10-CM

## 2023-08-14 DIAGNOSIS — N1832 Chronic kidney disease, stage 3b: Secondary | ICD-10-CM

## 2023-08-14 DIAGNOSIS — U071 COVID-19: Secondary | ICD-10-CM | POA: Diagnosis not present

## 2023-08-14 DIAGNOSIS — N179 Acute kidney failure, unspecified: Secondary | ICD-10-CM | POA: Diagnosis not present

## 2023-08-14 DIAGNOSIS — J189 Pneumonia, unspecified organism: Secondary | ICD-10-CM | POA: Diagnosis not present

## 2023-08-14 LAB — COMPREHENSIVE METABOLIC PANEL
ALT: 22 U/L (ref 0–44)
AST: 24 U/L (ref 15–41)
Albumin: 2.3 g/dL — ABNORMAL LOW (ref 3.5–5.0)
Alkaline Phosphatase: 43 U/L (ref 38–126)
Anion gap: 13 (ref 5–15)
BUN: 48 mg/dL — ABNORMAL HIGH (ref 8–23)
CO2: 17 mmol/L — ABNORMAL LOW (ref 22–32)
Calcium: 8.1 mg/dL — ABNORMAL LOW (ref 8.9–10.3)
Chloride: 110 mmol/L (ref 98–111)
Creatinine, Ser: 1.91 mg/dL — ABNORMAL HIGH (ref 0.61–1.24)
GFR, Estimated: 35 mL/min — ABNORMAL LOW (ref >60)
Glucose, Bld: 131 mg/dL — ABNORMAL HIGH (ref 70–99)
Potassium: 5.1 mmol/L (ref 3.5–5.1)
Sodium: 140 mmol/L (ref 135–145)
Total Bilirubin: 0.7 mg/dL (ref 0.3–1.2)
Total Protein: 5.5 g/dL — ABNORMAL LOW (ref 6.5–8.1)

## 2023-08-14 LAB — CBC WITH DIFFERENTIAL/PLATELET
Abs Immature Granulocytes: 0.04 10*3/uL (ref 0.00–0.07)
Eosinophils Absolute: 0 10*3/uL (ref 0.0–0.5)
Eosinophils Relative: 0 %
HCT: 34.2 % — ABNORMAL LOW (ref 39.0–52.0)
Hemoglobin: 10.8 g/dL — ABNORMAL LOW (ref 13.0–17.0)
Immature Granulocytes: 1 %
Lymphocytes Relative: 10 %
Lymphs Abs: 0.5 10*3/uL — ABNORMAL LOW (ref 0.7–4.0)
MCH: 26.7 pg (ref 26.0–34.0)
MCHC: 31.6 g/dL (ref 30.0–36.0)
MCV: 84.4 fL (ref 80.0–100.0)
Monocytes Absolute: 0.2 10*3/uL (ref 0.1–1.0)
Monocytes Relative: 5 %
Neutro Abs: 4.1 10*3/uL (ref 1.7–7.7)
Neutrophils Relative %: 85 %
Platelets: 77 10*3/uL — ABNORMAL LOW (ref 150–400)
RBC: 4.05 MIL/uL — ABNORMAL LOW (ref 4.22–5.81)
RDW: 14.4 % (ref 11.5–15.5)
WBC: 4.8 10*3/uL (ref 4.0–10.5)

## 2023-08-14 LAB — HEMOGLOBIN A1C
Hgb A1c MFr Bld: 5.9 % — ABNORMAL HIGH (ref 4.8–5.6)
Mean Plasma Glucose: 123 mg/dL

## 2023-08-14 LAB — GLUCOSE, CAPILLARY
Glucose-Capillary: 115 mg/dL — ABNORMAL HIGH (ref 70–99)
Glucose-Capillary: 167 mg/dL — ABNORMAL HIGH (ref 70–99)
Glucose-Capillary: 143 mg/dL — ABNORMAL HIGH (ref 70–99)
Glucose-Capillary: 161 mg/dL — ABNORMAL HIGH (ref 70–99)

## 2023-08-14 LAB — PROCALCITONIN: Procalcitonin: 0.64 ng/mL

## 2023-08-14 MED ORDER — CHLORHEXIDINE GLUCONATE CLOTH 2 % EX PADS
6.0000 | MEDICATED_PAD | Freq: Every day | CUTANEOUS | Status: DC
  Administered 2023-08-14 – 2023-08-15 (×2): 6 via TOPICAL

## 2023-08-14 NOTE — Assessment & Plan Note (Signed)
-   BP currently controlled - okay to resume lisinopril and hydrochlorothiazide at discharge

## 2023-08-14 NOTE — Assessment & Plan Note (Signed)
- 

## 2023-08-14 NOTE — Assessment & Plan Note (Signed)
-   CT chest noted with bilateral lower lobe airspace disease -Procalcitonin also mildly elevated, 0.64 - s/pRocephin and azithromycin; will finish course for 5 days total with amox and doxy

## 2023-08-14 NOTE — Hospital Course (Signed)
Daniel Sherman is an 82 yo male with PMH advanced dementia, CAD, DM II, HLD, HTN, GERD, IDA who was brought to the hospital with cough, fever, and tachycardia.  Further collateral information was also provided by son, [name] on admission. He was found to be positive for COVID-19.  CT chest was also obtained which showed bilateral lower lobe airspace disease concerning for superimposed pneumonia.  Groundglass opacities also noted in right upper lobe consistent with COVID infection. He was also found to have worsened renal function on admission and was started on IV fluids along with remdesivir and antibiotics.

## 2023-08-14 NOTE — Consult Note (Signed)
   Daily Progress Note   Patient Name: Daniel Sherman       Date: 08/14/2023 DOB: 02-Sep-1941  Age: 82 y.o. MRN#: [id number] Attending Physician: Lewie Chamber, MD Primary Care Physician: Jerl Mina, MD Admit Date: 08/12/2023 Length of Stay: 1 day  Discussed care with primary hospitalist today. New PMT consult placed to assist with end of life. Upon discussion with hospitalist, patient's kidney function currently improving with medical management for COVID infection, PNA, and dehydration. Continuing current medical care at this time. Patient already DNR/DNI; has ACP documentation on file. After discussion, determined will cancel consult at this time. Please reach out if PMT can be of assistance in the future. Thank you.    Palliative Care Provider PMT # [phone]

## 2023-08-14 NOTE — Assessment & Plan Note (Signed)
-   Likely in setting of COVID infection and suspected underlying bacterial pneumonia - weaned rapidly to RA on 9/11

## 2023-08-14 NOTE — Assessment & Plan Note (Signed)
-   Positive on admission, 08/13/2023 - Also noted to be hypoxic - rapid recovery and weaned off O2 soon after admission; therefore will hold off on finishing steroid course in setting of underlying diabetes - completed 2 doses of remdesivir but pulled IV out multiple times therefore will forego the 3rd dose

## 2023-08-14 NOTE — Assessment & Plan Note (Addendum)
-   patient has history of CKD3a. Baseline creat ~ 1.6 -1.9, eGFR~ 35 - patient presents with increase in creat >0.3 mg/dL above baseline or creat increase >1.5x baseline presumed to have occurred within past 7 days PTA - creat 2.63 on admission - has improved with IVF - creat 1.54 at discharge

## 2023-08-14 NOTE — Assessment & Plan Note (Signed)
-   Discussed on admission and patient is currently DNR/DNI - Multiple medical problems have been addressed since admission and he appears to have improved/stabilized.  Does not appear to have palliative symptoms need to be managed -At this time, okay for discontinuing palliative care consult and could consider outpatient palliative care referral

## 2023-08-14 NOTE — TOC Initial Note (Signed)
Transition of Care [facility]) - Initial/Assessment Note   Patient Details  Name: Daniel Sherman MRN: [id number] Date of Birth: 02-10-1941  Transition of Care [facility]) CM/SW Contact:    Ewing Schlein, LCSW Phone Number: 08/14/2023, 1:17 PM  Clinical Narrative: CSW confirmed with patient's son, Demetrio Gressman, that patient is a resident at [facility] memory care and was admitted to the facility on 08/09/23. CSW called [facility] and left message requesting call back regarding discharge needs prior to patient's return. TOC to follow.          Expected Discharge Plan: Memory Care Barriers to Discharge: Continued Medical Work up  Patient Goals and CMS Choice Patient states their goals for this hospitalization and ongoing recovery are:: Return to [facility] memory care CMS Medicare.gov Compare Post Acute Care list provided to:: Patient Represenative (must comment) Choice offered to / list presented to : Adult Children  Expected Discharge Plan and Services In-house Referral: Clinical Social Work Living arrangements for the past 2 months: Assisted Living Facility  Prior Living Arrangements/Services Living arrangements for the past 2 months: Assisted Living Facility Lives with:: Facility Resident Patient language and need for interpreter reviewed:: Yes Do you feel safe going back to the place where you live?: Yes      Need for Family Participation in Patient Care: Yes (Comment) (Patient had dementia and is oriented to self only.) Care giver support system in place?: Yes (comment) Criminal Activity/Legal Involvement Pertinent to Current Situation/Hospitalization: No - Comment as needed  Permission Sought/Granted Permission sought to share information with : Facility Industrial/product designer granted to share information with : Yes, Verbal Permission Granted Permission granted to share info w AGENCY: [employer]  Emotional Assessment Orientation: : Oriented to Self Alcohol /  Substance Use: Not Applicable Psych Involvement: No (comment)  Admission diagnosis:  CAP (community acquired pneumonia) [J18.9] AKI (acute kidney injury) (HCC) [N17.9] Pneumonia of both lower lobes due to infectious organism [J18.9] Sepsis, due to unspecified organism, unspecified whether acute organ dysfunction present [facility]) [A41.9] Patient Active Problem List   Diagnosis Date Noted   CAP (community acquired pneumonia) 08/13/2023   Hyperlipidemia 08/13/2023   Hyperkalemia 08/13/2023   COVID-19 08/13/2023   UTI (urinary tract infection) 05/10/2023   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   Thyroid nodule 01/18/2023   Pancytopenia (HCC) 11/14/2022   Sepsis (HCC) 11/13/2022   Dementia without behavioral disturbance (HCC) 11/13/2022   Primary hypertension 11/13/2022   Encephalopathy 11/12/2022   Acute metabolic encephalopathy 11/11/2022   Acute renal failure superimposed on stage 3a chronic kidney disease (HCC) 11/11/2022   Urinary tract infection 11/11/2022   Coronary atherosclerosis of native coronary artery 11/11/2022   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   Late onset Alzheimer's disease without behavioral disturbance (HCC) 01/13/2022   Gastroenteritis 06/17/2021   Abdominal pain    Nausea    Calculus of gallbladder with acute cholecystitis without obstruction    Goals of care, counseling/discussion 04/03/2020   IDA (iron deficiency anemia) 02/22/2020   Colon cancer s/p right colectomy (HCC) 08/12/2019   Hepatitis    Severe sepsis (HCC)    SIRS (systemic inflammatory response syndrome) (HCC) 12/09/2016   Abnormal LFTs 12/09/2016   Acute viral syndrome 12/09/2016   Trigger finger of left thumb 02/03/2016   B12 deficiency 12/02/2015   S/P left THA, 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   Obese 02/16/2015   Atypical chest pain 06/11/2014   KNEE PAIN, LEFT 10/21/2008   Diabetes mellitus without complication (HCC) 06/04/2007  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   Primary  thrombocytopenia (HCC) 06/04/2007   Essential hypertension 06/04/2007   PCP:  Jerl Mina, MD Pharmacy:   [facility] - [place], [place] - [address] [place] [address] Phone: [phone] Fax: [phone]  [facility] [address] [place] - [address] [place] [address] Phone: [phone] Fax: [phone]  [id number] - [place], [place] - [address] AT [facility] [address] [facility] [address] [place] [address] Phone: [phone] Fax: [phone]  Social Determinants of Health (SDOH) Social History: SDOH Screenings   Food Insecurity: No Food Insecurity (05/10/2023)  Housing: Low Risk  (05/10/2023)  Transportation Needs: No Transportation Needs (08/04/2023)   Received from [facility]  Utilities: Not At Risk (05/10/2023)  Alcohol Screen: Low Risk  (04/19/2023)  Depression (PHQ2-9): Low Risk  (04/19/2023)  Financial Resource Strain: Medium Risk (04/19/2023)  Physical Activity: Inactive (04/19/2023)  Social Connections: Unknown (08/03/2023)   Received from [facility]  Stress: Stress Concern Present (04/19/2023)  Tobacco Use: Medium Risk (08/12/2023)   SDOH Interventions:    Readmission Risk Interventions    08/14/2023    1:14 PM 11/14/2022   12:51 PM  Readmission Risk Prevention Plan  Transportation Screening Complete Complete  PCP or Specialist Appt within 3-5 Days  Complete  HRI or Home Care Consult  Complete  Social Work Consult for Recovery Care Planning/Counseling  Complete  Palliative Care Screening  Not Applicable  Medication Review Oceanographer)  Referral to Pharmacy  PCP or Specialist appointment within 3-5 days of discharge Complete   HRI or Home Care Consult Complete   SW Recovery Care/Counseling Consult Complete   Palliative Care Screening Not Applicable   Skilled Nursing Facility Not Applicable

## 2023-08-14 NOTE — Progress Notes (Signed)
Progress Note    Daniel Sherman   VOZ:[id number]  DOB: 1941-05-14  DOA: 08/12/2023     1 PCP: Jerl Mina, MD  Initial CC: cough, fever  Hospital Course: Mr. Ripple is an 82 yo male with PMH advanced dementia, CAD, DM II, HLD, HTN, GERD, IDA who was brought to the hospital with cough, fever, and tachycardia.  Further collateral information was also provided by son, [name] on admission. He was found to be positive for COVID-19.  CT chest was also obtained which showed bilateral lower lobe airspace disease concerning for superimposed pneumonia.  Groundglass opacities also noted in right upper lobe consistent with COVID infection. He was also found to have worsened renal function on admission and was started on IV fluids along with remdesivir and antibiotics.  Interval History:  Eating breakfast when seen this morning. Had pulled O2 off and was unable to replace it as he got defensive and agitated some when trying to do so. He allowed exam but was slightly hesitant.  Updated [name] this afternoon as well via phone.   Assessment and Plan: * CAP (community acquired pneumonia) - CT chest noted with bilateral lower lobe airspace disease -Procalcitonin also mildly elevated, 0.64 - Continue Rocephin and azithromycin  COVID-19 - Positive on admission, 08/13/2023 - Also noted to be hypoxic - Continue remdesivir and Decadron  Acute respiratory failure with hypoxia (HCC) - Likely in setting of COVID infection and suspected underlying bacterial pneumonia - Continue oxygen and wean as able - Dementia causing difficulty with monitoring but appears to be saturating well on room air on spot checks  Acute renal failure superimposed on stage 3b chronic kidney disease (HCC) - patient has history of CKD3a. Baseline creat ~ 1.6 -1.9, eGFR~ 35 - patient presents with increase in creat >0.3 mg/dL above baseline or creat increase >1.5x baseline presumed to have occurred within past 7 days PTA - creat 2.63  on admission - has improved with IVF - continue trending BMP  Hyperkalemia - mild, 5.2 on admission - no indication for correction at this time - continue trending  Goals of care, counseling/discussion - Discussed on admission and patient is currently DNR/DNI - Multiple medical problems have been addressed since admission and he appears to have improved/stabilized.  Does not appear to have palliative symptoms need to be managed -At this time, okay for discontinuing palliative care consult and could consider outpatient palliative care referral  Anemia due to chronic kidney disease - Baseline hemoglobin around 10 to 11 g/dL - Currently at baseline  Coronary atherosclerosis of native coronary artery - Continue statin - Lisinopril on hold  Late onset Alzheimer's disease without behavioral disturbance (HCC) - Continue Depakote, Lexapro, Aricept  Essential hypertension - BP currently controlled - Lisinopril and HCTZ on hold in setting of worsened renal function  Primary thrombocytopenia (HCC) - chronic - baseline appears around 60-70k    Old records reviewed in assessment of this patient  Antimicrobials: Remdesivir 08/13/2023 >> current  Vancomycin 08/13/2023 x 1 Cefepime 08/13/23 x 1 Flagyl 08/13/2023 >> x 1  Rocephin 08/13/2023 >> current Azithro 9/10 >> current  DVT prophylaxis:  SCDs Start: 08/13/23 1016   Code Status:   Code Status: Limited: Do not attempt resuscitation (DNR) -DNR-LIMITED -Do Not Intubate/DNI   Mobility Assessment (Last 72 Hours)     Mobility Assessment     Row Name 08/14/23 1300 08/13/23 1940 08/13/23 1306       Does patient have an order for bedrest or is patient medically  unstable No - Continue assessment No - Continue assessment No - Continue assessment     What is the highest level of mobility based on the progressive mobility assessment? Level 1 (Bedfast) - Unable to balance while sitting on edge of bed Level 1 (Bedfast) - Unable to balance  while sitting on edge of bed Level 2 (Chairfast) - Balance while sitting on edge of bed and cannot stand     Is the above level different from baseline mobility prior to current illness? Yes - Recommend PT order Yes - Recommend PT order Yes - Recommend PT order              Barriers to discharge: none Disposition Plan:  SNF Status is: Inpt  Objective: Blood pressure 121/69, pulse 88, temperature 98.6 F (37 C), temperature source Oral, resp. rate 20, weight 86.4 kg, SpO2 98%.  Examination:  Physical Exam Constitutional:      Comments: Underlying dementia appreciated.  Patient comfortable and eating breakfast.  Easily aggravated and worried when attempting to replace oxygen tubing  HENT:     Head: Normocephalic and atraumatic.     Mouth/Throat:     Mouth: Mucous membranes are moist.  Eyes:     Extraocular Movements: Extraocular movements intact.  Cardiovascular:     Rate and Rhythm: Normal rate and regular rhythm.  Pulmonary:     Effort: Pulmonary effort is normal. No respiratory distress.     Breath sounds: Rhonchi present. No wheezing.  Abdominal:     General: Bowel sounds are normal. There is no distension.     Palpations: Abdomen is soft.     Tenderness: There is no abdominal tenderness.  Musculoskeletal:        General: Normal range of motion.     Cervical back: Normal range of motion and neck supple.  Skin:    General: Skin is warm and dry.  Neurological:     Mental Status: Mental status is at baseline.     Comments: Dementia noted; moves all 4 extremities and follows commands      Consultants:    Procedures:    Data Reviewed: Results for orders placed or performed during the hospital encounter of 08/12/23 (from the past 24 hour(s))  Hemoglobin A1c     Status: Abnormal   Collection Time: 08/13/23  3:36 PM  Result Value Ref Range   Hgb A1c MFr Bld 5.9 (H) 4.8 - 5.6 %   Mean Plasma Glucose 123 mg/dL  Procalcitonin     Status: None   Collection Time:  08/13/23  3:36 PM  Result Value Ref Range   Procalcitonin 0.66 ng/mL  Glucose, capillary     Status: Abnormal   Collection Time: 08/13/23  9:14 PM  Result Value Ref Range   Glucose-Capillary 141 (H) 70 - 99 mg/dL  CBC with Differential     Status: Abnormal   Collection Time: 08/14/23  5:37 AM  Result Value Ref Range   WBC 4.8 4.0 - 10.5 K/uL   RBC 4.05 (L) 4.22 - 5.81 MIL/uL   Hemoglobin 10.8 (L) 13.0 - 17.0 g/dL   HCT 62.1 (L) 30.8 - 65.7 %   MCV 84.4 80.0 - 100.0 fL   MCH 26.7 26.0 - 34.0 pg   MCHC 31.6 30.0 - 36.0 g/dL   RDW 84.6 96.2 - 95.2 %   Platelets 77 (L) 150 - 400 K/uL   nRBC Not Measured 0.0 - 0.2 %   Neutrophils Relative % 85 %  Neutro Abs 4.1 1.7 - 7.7 K/uL   Lymphocytes Relative 10 %   Lymphs Abs 0.5 (L) 0.7 - 4.0 K/uL   Monocytes Relative 5 %   Monocytes Absolute 0.2 0.1 - 1.0 K/uL   Eosinophils Relative 0 %   Eosinophils Absolute 0.0 0.0 - 0.5 K/uL   Basophils Relative Not Measured %   Basophils Absolute NO ACID FAST BACILLI ISOLATED 0.0 - 0.1 K/uL   Immature Granulocytes 1 %   Abs Immature Granulocytes 0.04 0.00 - 0.07 K/uL  Comprehensive metabolic panel     Status: Abnormal   Collection Time: 08/14/23  5:37 AM  Result Value Ref Range   Sodium 140 135 - 145 mmol/L   Potassium 5.1 3.5 - 5.1 mmol/L   Chloride 110 98 - 111 mmol/L   CO2 17 (L) 22 - 32 mmol/L   Glucose, Bld 131 (H) 70 - 99 mg/dL   BUN 48 (H) 8 - 23 mg/dL   Creatinine, Ser 7.82 (H) 0.61 - 1.24 mg/dL   Calcium 8.1 (L) 8.9 - 10.3 mg/dL   Total Protein 5.5 (L) 6.5 - 8.1 g/dL   Albumin 2.3 (L) 3.5 - 5.0 g/dL   AST 24 15 - 41 U/L   ALT 22 0 - 44 U/L   Alkaline Phosphatase 43 38 - 126 U/L   Total Bilirubin 0.7 0.3 - 1.2 mg/dL   GFR, Estimated 35 (L) >60 mL/min   Anion gap 13 5 - 15  Procalcitonin     Status: None   Collection Time: 08/14/23  5:37 AM  Result Value Ref Range   Procalcitonin 0.64 ng/mL  Glucose, capillary     Status: Abnormal   Collection Time: 08/14/23  8:23 AM  Result  Value Ref Range   Glucose-Capillary 115 (H) 70 - 99 mg/dL  Glucose, capillary     Status: Abnormal   Collection Time: 08/14/23 11:55 AM  Result Value Ref Range   Glucose-Capillary 167 (H) 70 - 99 mg/dL   Comment 1 Notify RN    Comment 2 Document in Chart     I have reviewed pertinent nursing notes, vitals, labs, and images as necessary. I have ordered labwork to follow up on as indicated.  I have reviewed the last notes from staff over past 24 hours. I have discussed patient's care plan and test results with nursing staff, CM/SW, and other staff as appropriate.  Time spent: Greater than 50% of the 55 minute visit was spent in counseling/coordination of care for the patient as laid out in the A&P.   LOS: 1 day   Lewie Chamber, MD [facility] 08/14/2023, 3:08 PM

## 2023-08-14 NOTE — Assessment & Plan Note (Signed)
-   mild, 5.2 on admission - no indication for correction at this time - normalized prior to d/c

## 2023-08-14 NOTE — Assessment & Plan Note (Signed)
-   Continue statin - Lisinopril on hold

## 2023-08-14 NOTE — Assessment & Plan Note (Signed)
-   chronic - baseline appears around 60-70k

## 2023-08-14 NOTE — Assessment & Plan Note (Signed)
-   Continue Depakote, Lexapro, Aricept

## 2023-08-15 DIAGNOSIS — U071 COVID-19: Secondary | ICD-10-CM | POA: Diagnosis not present

## 2023-08-15 DIAGNOSIS — J189 Pneumonia, unspecified organism: Secondary | ICD-10-CM | POA: Diagnosis not present

## 2023-08-15 DIAGNOSIS — J9601 Acute respiratory failure with hypoxia: Secondary | ICD-10-CM | POA: Diagnosis not present

## 2023-08-15 DIAGNOSIS — N179 Acute kidney failure, unspecified: Secondary | ICD-10-CM | POA: Diagnosis not present

## 2023-08-15 LAB — COMPREHENSIVE METABOLIC PANEL
ALT: 23 U/L (ref 0–44)
AST: 22 U/L (ref 15–41)
Albumin: 2.8 g/dL — ABNORMAL LOW (ref 3.5–5.0)
Alkaline Phosphatase: 50 U/L (ref 38–126)
Anion gap: 7 (ref 5–15)
BUN: 56 mg/dL — ABNORMAL HIGH (ref 8–23)
CO2: 21 mmol/L — ABNORMAL LOW (ref 22–32)
Calcium: 8.4 mg/dL — ABNORMAL LOW (ref 8.9–10.3)
Chloride: 113 mmol/L — ABNORMAL HIGH (ref 98–111)
Creatinine, Ser: 1.54 mg/dL — ABNORMAL HIGH (ref 0.61–1.24)
GFR, Estimated: 45 mL/min — ABNORMAL LOW (ref >60)
Glucose, Bld: 158 mg/dL — ABNORMAL HIGH (ref 70–99)
Potassium: 4.7 mmol/L (ref 3.5–5.1)
Sodium: 141 mmol/L (ref 135–145)
Total Bilirubin: 0.5 mg/dL (ref 0.3–1.2)
Total Protein: 6.3 g/dL — ABNORMAL LOW (ref 6.5–8.1)

## 2023-08-15 LAB — CBC WITH DIFFERENTIAL/PLATELET
Abs Immature Granulocytes: 0.04 10*3/uL (ref 0.00–0.07)
Basophils Absolute: 0 10*3/uL (ref 0.0–0.1)
Basophils Relative: 0 %
Eosinophils Absolute: 0 10*3/uL (ref 0.0–0.5)
Eosinophils Relative: 0 %
HCT: 31.7 % — ABNORMAL LOW (ref 39.0–52.0)
Hemoglobin: 10 g/dL — ABNORMAL LOW (ref 13.0–17.0)
Immature Granulocytes: 1 %
Lymphocytes Relative: 7 %
Lymphs Abs: 0.3 10*3/uL — ABNORMAL LOW (ref 0.7–4.0)
MCH: 26.8 pg (ref 26.0–34.0)
MCHC: 31.5 g/dL (ref 30.0–36.0)
MCV: 85 fL (ref 80.0–100.0)
Monocytes Absolute: 0.2 10*3/uL (ref 0.1–1.0)
Monocytes Relative: 4 %
Neutro Abs: 3.3 10*3/uL (ref 1.7–7.7)
Neutrophils Relative %: 88 %
Platelets: 106 10*3/uL — ABNORMAL LOW (ref 150–400)
RBC: 3.73 MIL/uL — ABNORMAL LOW (ref 4.22–5.81)
RDW: 14.1 % (ref 11.5–15.5)
WBC: 3.8 10*3/uL — ABNORMAL LOW (ref 4.0–10.5)
nRBC: 0 % (ref 0.0–0.2)

## 2023-08-15 LAB — GLUCOSE, CAPILLARY
Glucose-Capillary: 146 mg/dL — ABNORMAL HIGH (ref 70–99)
Glucose-Capillary: 148 mg/dL — ABNORMAL HIGH (ref 70–99)
Glucose-Capillary: 176 mg/dL — ABNORMAL HIGH (ref 70–99)
Glucose-Capillary: 133 mg/dL — ABNORMAL HIGH (ref 70–99)

## 2023-08-15 MED ORDER — DOXYCYCLINE HYCLATE 100 MG PO TABS: 100.0000 mg | ORAL_TABLET | Freq: Two times a day (BID) | ORAL | Status: DC

## 2023-08-15 MED ORDER — LISINOPRIL 10 MG PO TABS: 10.0000 mg | ORAL_TABLET | Freq: Every day | ORAL | Status: DC

## 2023-08-15 MED ORDER — AMOXICILLIN 500 MG PO CAPS: 500.0000 mg | ORAL_CAPSULE | Freq: Three times a day (TID) | ORAL | Status: DC

## 2023-08-15 MED ORDER — DOXYCYCLINE HYCLATE 100 MG PO TABS
100.0000 mg | ORAL_TABLET | Freq: Two times a day (BID) | ORAL | Status: DC
  Administered 2023-08-15: 100 mg via ORAL
  Filled 2023-08-15: qty 1

## 2023-08-15 MED ORDER — AMOXICILLIN 500 MG PO CAPS
500.0000 mg | ORAL_CAPSULE | Freq: Three times a day (TID) | ORAL | Status: DC
  Administered 2023-08-15 (×2): 500 mg via ORAL
  Filled 2023-08-15 (×3): qty 1

## 2023-08-15 NOTE — Discharge Summary (Addendum)
Physician Discharge Summary   Terrez Rosales [facility] BJY:[id number] DOB: 06-Apr-1941 DOA: 08/12/2023  PCP: Jerl Mina, MD  Admit date: 08/12/2023 Discharge date: 08/15/2023   Admitted From: Memory care  Disposition:  Memory Care Discharging physician: Lewie Chamber, MD Barriers to discharge: none  Recommendations at discharge: Repeat BMP in 1-2 weeks. If recurrent AoCKD may need to d/c hydrochlorothiazide Consider referral to palliative care  Discharge Condition: stable CODE STATUS: DNR Diet recommendation:  Diet Orders (From admission, onward)     Start     Ordered   08/15/23 0000  Diet - low sodium heart healthy        08/15/23 1324   08/15/23 0000  Diet Carb Modified        08/15/23 1324   08/13/23 0757  Diet heart healthy/carb modified Fluid consistency: Thin  Diet effective now       Question:  Fluid consistency:  Answer:  Thin   08/13/23 0756            Hospital Course: Mr. Daniel Sherman is an 82 yo male with PMH advanced dementia, CAD, DM II, HLD, HTN, GERD, IDA who was brought to the hospital with cough, fever, and tachycardia.  Further collateral information was also provided by son, Daniel Sherman on admission. He was found to be positive for COVID-19.  CT chest was also obtained which showed bilateral lower lobe airspace disease concerning for superimposed pneumonia.  Groundglass opacities also noted in right upper lobe consistent with COVID infection. He was also found to have worsened renal function on admission and was started on IV fluids along with remdesivir and antibiotics.  Assessment and Plan: * CAP (community acquired pneumonia) - CT chest noted with bilateral lower lobe airspace disease -Procalcitonin also mildly elevated, 0.64 - s/pRocephin and azithromycin; will finish course for 5 days total with amox and doxy  COVID-19 - Positive on admission, 08/13/2023 - Also noted to be hypoxic - rapid recovery and weaned off O2 soon after admission; therefore will hold off on  finishing steroid course in setting of underlying diabetes - completed 2 doses of remdesivir but pulled IV out multiple times therefore will forego the 3rd dose  Acute respiratory failure with hypoxia (HCC) - Likely in setting of COVID infection and suspected underlying bacterial pneumonia - weaned rapidly to RA on 9/11  Acute renal failure superimposed on stage 3b chronic kidney disease (HCC) - patient has history of CKD3a. Baseline creat ~ 1.6 -1.9, eGFR~ 35 - patient presents with increase in creat >0.3 mg/dL above baseline or creat increase >1.5x baseline presumed to have occurred within past 7 days PTA - creat 2.63 on admission - has improved with IVF - creat 1.54 at discharge   Hyperkalemia-resolved as of 08/15/2023 - mild, 5.2 on admission - no indication for correction at this time - normalized prior to d/c  Goals of care, counseling/discussion - Discussed on admission and patient is currently DNR/DNI - Multiple medical problems have been addressed since admission and he appears to have improved/stabilized.  Does not appear to have palliative symptoms need to be managed -At this time, okay for discontinuing palliative care consult and could consider outpatient palliative care referral  Anemia due to chronic kidney disease - Baseline hemoglobin around 10 to 11 g/dL - Currently at baseline  Coronary atherosclerosis of native coronary artery - Continue statin and lisinopril  Late onset Alzheimer's disease without behavioral disturbance (HCC) - Continue Depakote, Lexapro, Aricept  Essential hypertension - BP currently controlled - okay to resume lisinopril  and hydrochlorothiazide at discharge   Primary thrombocytopenia (HCC) - chronic - baseline appears around 60-70k    Principal Diagnosis: CAP (community acquired pneumonia)  Discharge Diagnoses: Active Hospital Problems   Diagnosis Date Noted   CAP (community acquired pneumonia) 08/13/2023    Priority: 1.    COVID-19 08/13/2023    Priority: 1.   Acute respiratory failure with hypoxia (HCC) 08/14/2023    Priority: 2.   Acute renal failure superimposed on stage 3b chronic kidney disease (HCC) 11/11/2022    Priority: 3.   Goals of care, counseling/discussion 04/03/2020    Priority: 5.   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   Coronary atherosclerosis of native coronary artery 11/11/2022   Late onset Alzheimer's disease without behavioral disturbance (HCC) 01/13/2022   Essential hypertension 06/04/2007   Primary thrombocytopenia (HCC) 06/04/2007    Resolved Hospital Problems   Diagnosis Date Noted Date Resolved   Hyperkalemia 08/13/2023 08/15/2023    Priority: 4.     Discharge Instructions     Diet - low sodium heart healthy   Complete by: As directed    Diet Carb Modified   Complete by: As directed    Increase activity slowly   Complete by: As directed       Allergies as of 08/15/2023       Reactions   Other    BLOOD PRODUCT REFUSAL        Medication List     STOP taking these medications    benzonatate 200 MG capsule Commonly known as: TESSALON   linagliptin 5 MG Tabs tablet Commonly known as: TRADJENTA   memantine 10 MG tablet Commonly known as: NAMENDA   metFORMIN 500 MG tablet Commonly known as: GLUCOPHAGE   Niferex Tabs   simvastatin 40 MG tablet Commonly known as: ZOCOR   VITAMIN D PO       TAKE these medications    amoxicillin 500 MG capsule Commonly known as: AMOXIL Take 1 capsule (500 mg total) by mouth every 8 (eight) hours.   atorvastatin 20 MG tablet Commonly known as: LIPITOR Take 20 mg by mouth daily.   cholecalciferol 25 MCG (1000 UNIT) tablet Commonly known as: VITAMIN D3 Take 1,000 Units by mouth daily.   colesevelam 625 MG tablet Commonly known as: WELCHOL Take 1,875 mg by mouth 2 (two) times daily with a meal.   divalproex 250 MG DR tablet Commonly known as: DEPAKOTE Take 250 mg by mouth 3 (three) times  daily.   donepezil 10 MG tablet Commonly known as: ARICEPT Take 10 mg by mouth daily.   doxycycline 100 MG tablet Commonly known as: VIBRA-TABS Take 1 tablet (100 mg total) by mouth every 12 (twelve) hours.   escitalopram 10 MG tablet Commonly known as: LEXAPRO Take 10 mg by mouth 2 (two) times daily.   ferrous sulfate 325 (65 FE) MG EC tablet Take 325 mg by mouth daily with breakfast.   gabapentin 300 MG capsule Commonly known as: NEURONTIN Take 300 mg by mouth daily.   hydrochlorothiazide 12.5 MG tablet Commonly known as: HYDRODIURIL Take 12.5 mg by mouth daily.   lisinopril 10 MG tablet Commonly known as: ZESTRIL Take 1 tablet (10 mg total) by mouth daily. What changed: Another medication with the same name was removed. Continue taking this medication, and follow the directions you see here.   OLANZapine zydis 5 MG disintegrating tablet Commonly known as: ZYPREXA Take 5 mg by mouth at bedtime.        Allergies  Allergen Reactions   Other     BLOOD PRODUCT REFUSAL    Consultations:   Procedures:   Discharge Exam: BP 121/71 (BP Location: Left Arm)   Pulse 82   Temp 99.3 F (37.4 C) (Oral)   Resp 18   Wt 86.4 kg   SpO2 100%   BMI 31.70 kg/m  Physical Exam Constitutional:      Comments: Underlying dementia appreciated. Cooperative and follows commands  HENT:     Head: Normocephalic and atraumatic.     Mouth/Throat:     Mouth: Mucous membranes are moist.  Eyes:     Extraocular Movements: Extraocular movements intact.  Cardiovascular:     Rate and Rhythm: Normal rate and regular rhythm.  Pulmonary:     Effort: Pulmonary effort is normal. No respiratory distress.     Breath sounds: No wheezing or rhonchi.  Abdominal:     General: Bowel sounds are normal. There is no distension.     Palpations: Abdomen is soft.     Tenderness: There is no abdominal tenderness.  Musculoskeletal:        General: Normal range of motion.     Cervical back: Normal  range of motion and neck supple.  Skin:    General: Skin is warm and dry.  Neurological:     Mental Status: Mental status is at baseline.     Comments: Dementia noted; moves all 4 extremities and follows commands      The results of significant diagnostics from this hospitalization (including imaging, microbiology, ancillary and laboratory) are listed below for reference.   Microbiology: Recent Results (from the past 240 hour(s))  Blood Culture (routine x 2)     Status: None (Preliminary result)   Collection Time: 08/13/23 12:16 AM   Specimen: BLOOD  Result Value Ref Range Status   Specimen Description   Final    BLOOD RIGHT ANTECUBITAL Performed at [facility], [address] [place], [place] [address]    Special Requests   Final    BOTTLES DRAWN AEROBIC AND ANAEROBIC Blood Culture results may not be optimal due to an excessive volume of blood received in culture bottles Performed at [facility], [address], [place], [place] [address]    Culture   Final    NO GROWTH 2 DAYS Performed at [facility], [address], [place], [place] [address]    Report Status PENDING  Incomplete  Blood Culture (routine x 2)     Status: None (Preliminary result)   Collection Time: 08/13/23  4:18 AM   Specimen: BLOOD  Result Value Ref Range Status   Specimen Description   Final    BLOOD LEFT ANTECUBITAL Performed at [facility], [address] [place], [place] [address]    Special Requests   Final    BOTTLES DRAWN AEROBIC AND ANAEROBIC Blood Culture results may not be optimal due to an excessive volume of blood received in culture bottles Performed at [facility], [address], [place], [place] [address]    Culture   Final    NO GROWTH 2 DAYS Performed at [facility], [address] [place], [place] [address]    Report Status PENDING  Incomplete  Resp panel by RT-PCR (RSV, Flu A&B, Covid) Anterior  Nasal Swab     Status: Abnormal   Collection Time: 08/13/23  9:31 AM   Specimen: Anterior Nasal Swab  Result Value Ref Range Status  SARS Coronavirus 2 by RT PCR POSITIVE (A) NEGATIVE Final    Comment: (NOTE) SARS-CoV-2 target nucleic acids are DETECTED.  The SARS-CoV-2 RNA is generally detectable in upper respiratory specimens during the acute phase of infection. Positive results are indicative of the presence of the identified virus, but do not rule out bacterial infection or co-infection with other pathogens not detected by the test. Clinical correlation with patient history and other diagnostic information is necessary to determine patient infection status. The expected result is Negative.  Fact Sheet for Patients: BloggerCourse.com  Fact Sheet for Healthcare Providers: SeriousBroker.it  This test is not yet approved or cleared by the [place] FDA and  has been authorized for detection and/or diagnosis of SARS-CoV-2 by FDA under an Emergency Use Authorization (EUA).  This EUA will remain in effect (meaning this test can be used) for the duration of  the COVID-19 declaration under Section 564(b)(1) of the A ct, 21 U.S.C. section 360bbb-3(b)(1), unless the authorization is terminated or revoked sooner.     Influenza A by PCR NEGATIVE NEGATIVE Final   Influenza B by PCR NEGATIVE NEGATIVE Final    Comment: (NOTE) The Xpert Xpress SARS-CoV-2/FLU/RSV plus assay is intended as an aid in the diagnosis of influenza from Nasopharyngeal swab specimens and should not be used as a sole basis for treatment. Nasal washings and aspirates are unacceptable for Xpert Xpress SARS-CoV-2/FLU/RSV testing.  Fact Sheet for Patients: BloggerCourse.com  Fact Sheet for Healthcare Providers: SeriousBroker.it  This test is not yet approved or cleared by the [place] FDA and has been  authorized for detection and/or diagnosis of SARS-CoV-2 by FDA under an Emergency Use Authorization (EUA). This EUA will remain in effect (meaning this test can be used) for the duration of the COVID-19 declaration under Section 564(b)(1) of the Act, 21 U.S.C. section 360bbb-3(b)(1), unless the authorization is terminated or revoked.     Resp Syncytial Virus by PCR NEGATIVE NEGATIVE Final    Comment: (NOTE) Fact Sheet for Patients: BloggerCourse.com  Fact Sheet for Healthcare Providers: SeriousBroker.it  This test is not yet approved or cleared by the [place] FDA and has been authorized for detection and/or diagnosis of SARS-CoV-2 by FDA under an Emergency Use Authorization (EUA). This EUA will remain in effect (meaning this test can be used) for the duration of the COVID-19 declaration under Section 564(b)(1) of the Act, 21 U.S.C. section 360bbb-3(b)(1), unless the authorization is terminated or revoked.  Performed at [facility], [address] [place], [place] [address]      Labs: BNP (last 3 results) Recent Labs    08/13/23 0020  BNP 13.7   Basic Metabolic Panel: Recent Labs  Lab 08/13/23 0020 08/13/23 0613 08/14/23 0537 08/15/23 0527  NA 135 135 140 141  K 5.2* 4.7 5.1 4.7  CL 105 108 110 113*  CO2 19* 16* 17* 21*  GLUCOSE 115* 140* 131* 158*  BUN 70* 64* 48* 56*  CREATININE 2.63* 2.26* 1.91* 1.54*  CALCIUM 8.6* 7.4* 8.1* 8.4*   Liver Function Tests: Recent Labs  Lab 08/13/23 0020 08/14/23 0537 08/15/23 0527  AST 45* 24 22  ALT 31 22 23   ALKPHOS 63 43 50  BILITOT 0.7 0.7 0.5  PROT 7.2 5.5* 6.3*  ALBUMIN 3.5 2.3* 2.8*   No results for input(s): "LIPASE", "AMYLASE" in the last 168 hours. No results for input(s): "AMMONIA" in the last 168 hours. CBC: Recent Labs  Lab 08/13/23 0020 08/14/23 0537 08/15/23 0527  WBC 5.5  4.8 3.8*  NEUTROABS 4.5 4.1 3.3  HGB 11.5* 10.8*  10.0*  HCT 36.5* 34.2* 31.7*  MCV 85.3 84.4 85.0  PLT 93* 77* 106*   Cardiac Enzymes: No results for input(s): "CKTOTAL", "CKMB", "CKMBINDEX", "TROPONINI" in the last 168 hours. BNP: Invalid input(s): "POCBNP" CBG: Recent Labs  Lab 08/14/23 1713 08/14/23 2212 08/15/23 0725 08/15/23 1019 08/15/23 1208  GLUCAP 143* 161* 146* 148* 176*   D-Dimer No results for input(s): "DDIMER" in the last 72 hours. Hgb A1c Recent Labs    08/13/23 1536  HGBA1C 5.9*   Lipid Profile No results for input(s): "CHOL", "HDL", "LDLCALC", "TRIG", "CHOLHDL", "LDLDIRECT" in the last 72 hours. Thyroid function studies No results for input(s): "TSH", "T4TOTAL", "T3FREE", "THYROIDAB" in the last 72 hours.  Invalid input(s): "FREET3" Anemia work up No results for input(s): "VITAMINB12", "FOLATE", "FERRITIN", "TIBC", "IRON", "RETICCTPCT" in the last 72 hours. Urinalysis    Component Value Date/Time   COLORURINE YELLOW 08/12/2023 2307   APPEARANCEUR CLEAR 08/12/2023 2307   LABSPEC 1.015 08/12/2023 2307   PHURINE 5.0 08/12/2023 2307   GLUCOSEU NEGATIVE 08/12/2023 2307   HGBUR MODERATE (A) 08/12/2023 2307   BILIRUBINUR NEGATIVE 08/12/2023 2307   KETONESUR 5 (A) 08/12/2023 2307   PROTEINUR NEGATIVE 08/12/2023 2307   UROBILINOGEN 1.0 02/09/2015 1005   NITRITE NEGATIVE 08/12/2023 2307   LEUKOCYTESUR NEGATIVE 08/12/2023 2307   Sepsis Labs Recent Labs  Lab 08/13/23 0020 08/14/23 0537 08/15/23 0527  WBC 5.5 4.8 3.8*   Microbiology Recent Results (from the past 240 hour(s))  Blood Culture (routine x 2)     Status: None (Preliminary result)   Collection Time: 08/13/23 12:16 AM   Specimen: BLOOD  Result Value Ref Range Status   Specimen Description   Final    BLOOD RIGHT ANTECUBITAL Performed at [facility], [address] [place], [place] [address]    Special Requests   Final    BOTTLES DRAWN AEROBIC AND ANAEROBIC Blood Culture results may not be optimal due to an excessive volume of  blood received in culture bottles Performed at [facility], [address] [place], [place] [address]    Culture   Final    NO GROWTH 2 DAYS Performed at [facility], [address], [place], [place] [address]    Report Status PENDING  Incomplete  Blood Culture (routine x 2)     Status: None (Preliminary result)   Collection Time: 08/13/23  4:18 AM   Specimen: BLOOD  Result Value Ref Range Status   Specimen Description   Final    BLOOD LEFT ANTECUBITAL Performed at [facility], [address] [place], [place] [address]    Special Requests   Final    BOTTLES DRAWN AEROBIC AND ANAEROBIC Blood Culture results may not be optimal due to an excessive volume of blood received in culture bottles Performed at [facility], [address] [place], [place] [address]    Culture   Final    NO GROWTH 2 DAYS Performed at [facility], [address], [place], [place] [address]    Report Status PENDING  Incomplete  Resp panel by RT-PCR (RSV, Flu A&B, Covid) Anterior Nasal Swab     Status: Abnormal   Collection Time: 08/13/23  9:31 AM   Specimen: Anterior Nasal Swab  Result Value Ref Range Status   SARS Coronavirus 2 by RT PCR POSITIVE (A) NEGATIVE Final    Comment: (NOTE) SARS-CoV-2 target  nucleic acids are DETECTED.  The SARS-CoV-2 RNA is generally detectable in upper respiratory specimens during the acute phase of infection. Positive results are indicative of the presence of the identified virus, but do not rule out bacterial infection or co-infection with other pathogens not detected by the test. Clinical correlation with patient history and other diagnostic information is necessary to determine patient infection status. The expected result is Negative.  Fact Sheet for Patients: BloggerCourse.com  Fact Sheet for Healthcare Providers: SeriousBroker.it  This test  is not yet approved or cleared by the [place] FDA and  has been authorized for detection and/or diagnosis of SARS-CoV-2 by FDA under an Emergency Use Authorization (EUA).  This EUA will remain in effect (meaning this test can be used) for the duration of  the COVID-19 declaration under Section 564(b)(1) of the A ct, 21 U.S.C. section 360bbb-3(b)(1), unless the authorization is terminated or revoked sooner.     Influenza A by PCR NEGATIVE NEGATIVE Final   Influenza B by PCR NEGATIVE NEGATIVE Final    Comment: (NOTE) The Xpert Xpress SARS-CoV-2/FLU/RSV plus assay is intended as an aid in the diagnosis of influenza from Nasopharyngeal swab specimens and should not be used as a sole basis for treatment. Nasal washings and aspirates are unacceptable for Xpert Xpress SARS-CoV-2/FLU/RSV testing.  Fact Sheet for Patients: BloggerCourse.com  Fact Sheet for Healthcare Providers: SeriousBroker.it  This test is not yet approved or cleared by the [place] FDA and has been authorized for detection and/or diagnosis of SARS-CoV-2 by FDA under an Emergency Use Authorization (EUA). This EUA will remain in effect (meaning this test can be used) for the duration of the COVID-19 declaration under Section 564(b)(1) of the Act, 21 U.S.C. section 360bbb-3(b)(1), unless the authorization is terminated or revoked.     Resp Syncytial Virus by PCR NEGATIVE NEGATIVE Final    Comment: (NOTE) Fact Sheet for Patients: BloggerCourse.com  Fact Sheet for Healthcare Providers: SeriousBroker.it  This test is not yet approved or cleared by the [place] FDA and has been authorized for detection and/or diagnosis of SARS-CoV-2 by FDA under an Emergency Use Authorization (EUA). This EUA will remain in effect (meaning this test can be used) for the duration of the COVID-19 declaration under Section  564(b)(1) of the Act, 21 U.S.C. section 360bbb-3(b)(1), unless the authorization is terminated or revoked.  Performed at [facility], [address], [place], [place] [address]     Procedures/Studies: ECHOCARDIOGRAM COMPLETE  Result Date: 08/13/2023    ECHOCARDIOGRAM REPORT   Patient Name:   Daniel Sherman Date of Exam: 08/13/2023 Medical Rec #:  [id number]     Height:       65.0 in Accession #:    [id number]    Weight:       192.7 lb Date of Birth:  1941-03-14     BSA:          1.947 m Patient Age:    82 years      BP:           142/62 mmHg Patient Gender: M             HR:           81 bpm. Exam Location:  Inpatient Procedure: 2D Echo, Cardiac Doppler and Color Doppler Indications:    Atrial Fibrillation  History:        Patient has no prior history of Echocardiogram examinations.  CAD, Arrythmias:Atrial Fibrillation, Signs/Symptoms:Chest Pain;                 Risk Factors:Hypertension, Diabetes and Dyslipidemia. CKD, COVID                 +.  Sonographer:    Mikki Harbor Referring Phys: [id number] Rosalina Dingwall MANUEL ORTIZ  Sonographer Comments: Technically difficult study due to poor echo windows. Image acquisition challenging due to respiratory motion. IMPRESSIONS  1. Left ventricular ejection fraction, by estimation, is 60 to 65%. The left ventricle has normal function. The left ventricle has no regional wall motion abnormalities. There is mild asymmetric left ventricular hypertrophy. Indeterminate diastolic filling due to E-A fusion.  2. Right ventricular systolic function is normal. The right ventricular size is normal. There is normal pulmonary artery systolic pressure. The estimated right ventricular systolic pressure is 19.2 mmHg.  3. The mitral valve is grossly normal. Trivial mitral valve regurgitation. No evidence of mitral stenosis.  4. The aortic valve is tricuspid. Aortic valve regurgitation is not visualized. No aortic stenosis is present.  5. The inferior vena  cava is normal in size with greater than 50% respiratory variability, suggesting right atrial pressure of 3 mmHg. FINDINGS  Left Ventricle: Left ventricular ejection fraction, by estimation, is 60 to 65%. The left ventricle has normal function. The left ventricle has no regional wall motion abnormalities. The left ventricular internal cavity size was normal in size. There is  mild asymmetric left ventricular hypertrophy. Indeterminate diastolic filling due to E-A fusion. Right Ventricle: The right ventricular size is normal. No increase in right ventricular wall thickness. Right ventricular systolic function is normal. There is normal pulmonary artery systolic pressure. The tricuspid regurgitant velocity is 2.01 m/s, and  with an assumed right atrial pressure of 3 mmHg, the estimated right ventricular systolic pressure is 19.2 mmHg. Left Atrium: Left atrial size was normal in size. Right Atrium: Right atrial size was normal in size. Pericardium: There is no evidence of pericardial effusion. Presence of epicardial fat layer. Mitral Valve: The mitral valve is grossly normal. Trivial mitral valve regurgitation. No evidence of mitral valve stenosis. MV peak gradient, 4.5 mmHg. The mean mitral valve gradient is 2.0 mmHg. Tricuspid Valve: The tricuspid valve is grossly normal. Tricuspid valve regurgitation is trivial. No evidence of tricuspid stenosis. Aortic Valve: The aortic valve is tricuspid. Aortic valve regurgitation is not visualized. No aortic stenosis is present. Aortic valve mean gradient measures 5.0 mmHg. Aortic valve peak gradient measures 8.1 mmHg. Aortic valve area, by VTI measures 3.07 cm. Pulmonic Valve: The pulmonic valve was grossly normal. Pulmonic valve regurgitation is not visualized. No evidence of pulmonic stenosis. Aorta: The aortic root is normal in size and structure. Venous: The inferior vena cava is normal in size with greater than 50% respiratory variability, suggesting right atrial pressure  of 3 mmHg. IAS/Shunts: The atrial septum is grossly normal.  LEFT VENTRICLE PLAX 2D LVIDd:         4.10 cm   Diastology LVIDs:         2.00 cm   LV e' medial:    8.16 cm/s LV PW:         1.00 cm   LV E/e' medial:  9.2 LV IVS:        1.30 cm   LV e' lateral:   6.53 cm/s LVOT diam:     2.10 cm   LV E/e' lateral: 11.5 LV SV:         83 LV  SV Index:   43 LVOT Area:     3.46 cm  RIGHT VENTRICLE RV Basal diam:  3.10 cm RV Mid diam:    2.90 cm RV S prime:     13.90 cm/s LEFT ATRIUM           Index        RIGHT ATRIUM           Index LA diam:      3.60 cm 1.85 cm/m   RA Area:     13.30 cm LA Vol (A2C): 35.8 ml 18.39 ml/m  RA Volume:   30.10 ml  15.46 ml/m LA Vol (A4C): 57.8 ml 29.69 ml/m  AORTIC VALVE                     PULMONIC VALVE AV Area (Vmax):    3.10 cm      PV Vmax:       1.22 m/s AV Area (Vmean):   2.59 cm      PV Peak grad:  6.0 mmHg AV Area (VTI):     3.07 cm AV Vmax:           142.00 cm/s AV Vmean:          105.000 cm/s AV VTI:            0.270 m AV Peak Grad:      8.1 mmHg AV Mean Grad:      5.0 mmHg LVOT Vmax:         127.00 cm/s LVOT Vmean:        78.500 cm/s LVOT VTI:          0.239 m LVOT/AV VTI ratio: 0.89  AORTA Ao Root diam: 3.40 cm MITRAL VALVE                TRICUSPID VALVE MV Area (PHT): 4.80 cm     TR Peak grad:   16.2 mmHg MV Area VTI:   4.47 cm     TR Vmax:        201.00 cm/s MV Peak grad:  4.5 mmHg MV Mean grad:  2.0 mmHg     SHUNTS MV Vmax:       1.06 m/s     Systemic VTI:  0.24 m MV Vmean:      60.5 cm/s    Systemic Diam: 2.10 cm MV Decel Time: 158 msec MV E velocity: 75.40 cm/s MV A velocity: 109.00 cm/s MV E/A ratio:  0.69 Lennie Odor MD Electronically signed by Lennie Odor MD Signature Date/Time: 08/13/2023/3:22:35 PM    Final    DG Chest Portable 1 View  Result Date: 08/13/2023 CLINICAL DATA:  82 year old male with shortness of breath. EXAM: PORTABLE CHEST 1 VIEW COMPARISON:  Chest CT 0213 hours today and earlier. FINDINGS: Portable AP semi upright view at 0433 hours.  Kyphotic positioning. Left greater than right lung base opacity redemonstrated. Stable lung volumes. Mediastinal contours remain within normal limits. Visualized tracheal air column is within normal limits. No pneumothorax or pulmonary edema. No areas of worsening ventilation. Paucity of bowel gas.  No acute osseous abnormality identified. IMPRESSION: Left greater than right lung base opacity as seen by CT this morning. No new cardiopulmonary abnormality. Electronically Signed   By: Odessa Fleming M.D.   On: 08/13/2023 06:24   CT Chest Wo Contrast  Result Date: 08/13/2023 CLINICAL DATA:  Cough with difficulty breathing EXAM: CT CHEST WITHOUT CONTRAST TECHNIQUE: Multidetector CT imaging of the chest  was performed following the standard protocol without IV contrast. RADIATION DOSE REDUCTION: This exam was performed according to the departmental dose-optimization program which includes automated exposure control, adjustment of the mA and/or kV according to patient size and/or use of iterative reconstruction technique. COMPARISON:  CT chest abdomen and pelvis 03/21/2021 FINDINGS: Cardiovascular: No significant vascular findings. Normal heart size. No pericardial effusion. There are atherosclerotic calcifications of the aorta and coronary arteries. Mediastinum/Nodes: There is a 17 mm hypodense right thyroid nodule which has minimally increased in size compared to the prior examination. There are no enlarged lymph nodes. Visualized esophagus is within normal limits. Lungs/Pleura: There some faint minimal patchy ground-glass opacities and tree-in-bud opacities in the right upper lobe. There is airspace disease in the bilateral lower lobes, left greater than right. There central peribronchial wall thickening bilaterally. There is no pneumothorax or pleural effusion. Upper Abdomen: There are calcified granulomas in the spleen. The spleen is mildly enlarged. Cholecystectomy clips are present. Musculoskeletal: No chest wall mass  or suspicious bone lesions identified. IMPRESSION: 1. Bilateral lower lobe airspace disease, left greater than right, worrisome for pneumonia. 2. Faint patchy ground-glass opacities and tree-in-bud opacities in the right upper lobe, likely infectious/inflammatory. 3. Central peribronchial wall thickening compatible with bronchitis. 4. Mild splenomegaly 5. Incidental right thyroid nodule measuring 1.7 cm. Recommend non-emergent thyroid ultrasound. Reference: J Am Coll Radiol. 2015 Feb;12(2): 143-50 Aortic Atherosclerosis (ICD10-I70.0). Electronically Signed   By: Darliss Cheney M.D.   On: 08/13/2023 03:29   CT Head Wo Contrast  Result Date: 08/13/2023 CLINICAL DATA:  Altered mental status. EXAM: CT HEAD WITHOUT CONTRAST TECHNIQUE: Contiguous axial images were obtained from the base of the skull through the vertex without intravenous contrast. RADIATION DOSE REDUCTION: This exam was performed according to the departmental dose-optimization program which includes automated exposure control, adjustment of the mA and/or kV according to patient size and/or use of iterative reconstruction technique. COMPARISON:  November 11, 2022 FINDINGS: Brain: There is mild cerebral atrophy with widening of the extra-axial spaces and ventricular dilatation. There are areas of decreased attenuation within the white matter tracts of the supratentorial brain, consistent with microvascular disease changes. Vascular: No hyperdense vessel or unexpected calcification. Skull: Normal. Negative for fracture or focal lesion. Sinuses/Orbits: No acute finding. Other: None. IMPRESSION: 1. Generalized cerebral atrophy with chronic white matter small vessel ischemic changes. 2. No acute intracranial abnormality. Electronically Signed   By: Aram Candela M.D.   On: 08/13/2023 00:48   DG Chest Port 1 View  Result Date: 08/13/2023 CLINICAL DATA:  Low-grade fever and cough. EXAM: PORTABLE CHEST 1 VIEW COMPARISON:  May 10, 2023 FINDINGS: The  heart size and mediastinal contours are within normal limits. Low lung volumes are noted. Mild atelectasis is seen within the bilateral lung bases. There is no evidence of focal consolidation, pleural effusion or pneumothorax. The visualized skeletal structures are unremarkable. IMPRESSION: Low lung volumes with mild bibasilar atelectasis. Electronically Signed   By: Aram Candela M.D.   On: 08/13/2023 00:46     Time coordinating discharge: Over 30 minutes    Lewie Chamber, MD  [facility] 08/15/2023, 1:32 PM

## 2023-08-15 NOTE — Progress Notes (Signed)
Nursing Report called to [facility]. Awaiting PTAR arrival for transportation.

## 2023-08-15 NOTE — [place] FL2 (Signed)
[place] MEDICAID FL2 LEVEL OF CARE FORM     IDENTIFICATION  Patient Name: Daniel Sherman Birthdate: 1941-10-04 Sex: male Admission Date (Current Location): 08/12/2023  [facility] and [place] Number:  Producer, television/film/video and Address:  [facility],  [address] [place], [place] [address]      Provider Number: [id number]  Attending Physician Name and Address:  Lewie Chamber, MD  Relative Name and Phone Number:  Dardan Zietlow (son) Ph: [phone]    Current Level of Care: Hospital Recommended Level of Care: Memory Care Prior Approval Number:    Date Approved/Denied:   PASRR Number:    Discharge Plan: Other (Comment) [facility] memory care)    Current Diagnoses: Patient Active Problem List   Diagnosis Date Noted   Acute respiratory failure with hypoxia (HCC) 08/14/2023   CAP (community acquired pneumonia) 08/13/2023   Hyperlipidemia 08/13/2023   COVID-19 08/13/2023   UTI (urinary tract infection) 05/10/2023   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   Thyroid nodule 01/18/2023   Pancytopenia (HCC) 11/14/2022   Sepsis (HCC) 11/13/2022   Dementia without behavioral disturbance (HCC) 11/13/2022   Primary hypertension 11/13/2022   Encephalopathy 11/12/2022   Acute metabolic encephalopathy 11/11/2022   Acute renal failure superimposed on stage 3b chronic kidney disease (HCC) 11/11/2022   Urinary tract infection 11/11/2022   Coronary atherosclerosis of native coronary artery 11/11/2022   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   Late onset Alzheimer's disease without behavioral disturbance (HCC) 01/13/2022   Gastroenteritis 06/17/2021   Abdominal pain    Nausea    Calculus of gallbladder with acute cholecystitis without obstruction    Goals of care, counseling/discussion 04/03/2020   IDA (iron deficiency anemia) 02/22/2020   Colon cancer s/p right colectomy (HCC) 08/12/2019   Hepatitis    Severe sepsis (HCC)    SIRS (systemic  inflammatory response syndrome) (HCC) 12/09/2016   Abnormal LFTs 12/09/2016   Acute viral syndrome 12/09/2016   Trigger finger of left thumb 02/03/2016   B12 deficiency 12/02/2015   S/P left THA, 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   Obese 02/16/2015   Atypical chest pain 06/11/2014   KNEE PAIN, LEFT 10/21/2008   Diabetes mellitus without complication (HCC) 06/04/2007   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   Primary thrombocytopenia (HCC) 06/04/2007   Essential hypertension 06/04/2007    Orientation RESPIRATION BLADDER Height & Weight     Self, Time, Situation, Place  Normal Incontinent Weight: 190 lb 7.6 oz (86.4 kg) Height:     BEHAVIORAL SYMPTOMS/MOOD NEUROLOGICAL BOWEL NUTRITION STATUS      Incontinent Diet (Heart healthy/carb modified diet)  AMBULATORY STATUS COMMUNICATION OF NEEDS Skin   Extensive Assist Verbally Skin abrasions (Abrasions: bilateral arms)                       Personal Care Assistance Level of Assistance  Bathing, Feeding, Dressing Bathing Assistance: Maximum assistance Feeding assistance: Limited assistance Dressing Assistance: Maximum assistance     Functional Limitations Info  Sight, Hearing, Speech Sight Info: Impaired Hearing Info: Adequate Speech Info: Adequate    SPECIAL CARE FACTORS FREQUENCY                       Contractures Contractures Info: Not present    Additional Factors Info  Code Status, Allergies, Insulin Sliding Scale, Psychotropic Code Status Info: DNR Allergies Info: Other Psychotropic Info: Lexapro, Zyprexa Insulin Sliding Scale Info: See discharge summary       Current Medications (  08/15/2023):  This is the current hospital active medication list Current Facility-Administered Medications  Medication Dose Route Frequency Provider Last Rate Last Admin   acetaminophen (TYLENOL) tablet 650 mg  650 mg Oral Q6H PRN Bobette Mo, MD       Or   acetaminophen (TYLENOL) suppository 650 mg  650 mg Rectal Q6H PRN Bobette Mo, MD       amoxicillin (AMOXIL) capsule 500 mg  500 mg Oral Q8H Lewie Chamber, MD       atorvastatin (LIPITOR) tablet 20 mg  20 mg Oral q1800 Bobette Mo, MD   20 mg at 08/14/23 1730   Chlorhexidine Gluconate Cloth 2 % PADS 6 each  6 each Topical Daily Lewie Chamber, MD   6 each at 08/15/23 1012   cholecalciferol (VITAMIN D3) 25 MCG (1000 UNIT) tablet 1,000 Units  1,000 Units Oral Daily Bobette Mo, MD   1,000 Units at 08/15/23 1013   colesevelam [facility]) tablet 1,875 mg  1,875 mg Oral BID WC Bobette Mo, MD   1,875 mg at 08/15/23 1012   divalproex (DEPAKOTE) DR tablet 250 mg  250 mg Oral TID Bobette Mo, MD   250 mg at 08/15/23 1013   donepezil (ARICEPT) tablet 10 mg  10 mg Oral Daily Bobette Mo, MD   10 mg at 08/15/23 1013   doxycycline (VIBRA-TABS) tablet 100 mg  100 mg Oral Q12H Lewie Chamber, MD       escitalopram (LEXAPRO) tablet 10 mg  10 mg Oral BID Bobette Mo, MD   10 mg at 08/15/23 1013   ferrous sulfate tablet 325 mg  325 mg Oral Q breakfast Bobette Mo, MD   325 mg at 08/15/23 1013   gabapentin (NEURONTIN) capsule 300 mg  300 mg Oral Daily Bobette Mo, MD   300 mg at 08/15/23 1012   insulin aspart (novoLOG) injection 0-20 Units  0-20 Units Subcutaneous TID WC Bobette Mo, MD   3 Units at 08/15/23 1020   ipratropium-albuterol (DUONEB) 0.5-2.5 (3) MG/3ML nebulizer solution 3 mL  3 mL Nebulization Q4H PRN Bobette Mo, MD   3 mL at 08/13/23 1322   OLANZapine zydis (ZYPREXA) disintegrating tablet 5 mg  5 mg Oral QHS Bobette Mo, MD   5 mg at 08/14/23 2135   ondansetron (ZOFRAN) tablet 4 mg  4 mg Oral Q6H PRN Bobette Mo, MD       Or   ondansetron [facility]) injection 4 mg  4 mg Intravenous Q6H PRN Bobette Mo, MD         Discharge Medications: Please see discharge summary for a list of discharge medications.  Medication List       STOP taking these medications      benzonatate 200 MG capsule Commonly known as: TESSALON    linagliptin 5 MG Tabs tablet Commonly known as: TRADJENTA    memantine 10 MG tablet Commonly known as: NAMENDA    metFORMIN 500 MG tablet Commonly known as: GLUCOPHAGE    Niferex Tabs    simvastatin 40 MG tablet Commonly known as: ZOCOR    VITAMIN D PO           TAKE these medications     amoxicillin 500 MG capsule Commonly known as: AMOXIL Take 1 capsule (500 mg total) by mouth every 8 (eight) hours.    atorvastatin 20 MG tablet Commonly known as: LIPITOR Take 20 mg by mouth daily.  cholecalciferol 25 MCG (1000 UNIT) tablet Commonly known as: VITAMIN D3 Take 1,000 Units by mouth daily.    colesevelam 625 MG tablet Commonly known as: WELCHOL Take 1,875 mg by mouth 2 (two) times daily with a meal.    divalproex 250 MG DR tablet Commonly known as: DEPAKOTE Take 250 mg by mouth 3 (three) times daily.    donepezil 10 MG tablet Commonly known as: ARICEPT Take 10 mg by mouth daily.    doxycycline 100 MG tablet Commonly known as: VIBRA-TABS Take 1 tablet (100 mg total) by mouth every 12 (twelve) hours.    escitalopram 10 MG tablet Commonly known as: LEXAPRO Take 10 mg by mouth 2 (two) times daily.    ferrous sulfate 325 (65 FE) MG EC tablet Take 325 mg by mouth daily with breakfast.    gabapentin 300 MG capsule Commonly known as: NEURONTIN Take 300 mg by mouth daily.    hydrochlorothiazide 12.5 MG tablet Commonly known as: HYDRODIURIL Take 12.5 mg by mouth daily.    lisinopril 10 MG tablet Commonly known as: ZESTRIL Take 1 tablet (10 mg total) by mouth daily. What changed: Another medication with the same name was removed. Continue taking this medication, and follow the directions you see here.    OLANZapine zydis 5 MG disintegrating tablet Commonly known as: ZYPREXA Take 5 mg by mouth at bedtime.    Relevant Imaging Results:  Relevant Lab Results:   Additional Information SSN:  [id number]  Ewing Schlein, LCSW

## 2023-08-15 NOTE — TOC Transition Note (Signed)
Transition of Care [facility]) - CM/SW Discharge Note  Patient Details  Name: Daniel Sherman MRN: [id number] Date of Birth: 06-09-1941  [facility]) CM/SW Contact:  Ewing Schlein, LCSW Phone Number: 08/15/2023, 2:56 PM  Clinical Narrative: Patient is medically ready to return to memory care at [facility]. CSW spoke with Latoya from the facility and she will complete an in-person assessment of the patient prior to return.   Facility assessment completed. FL2 done. FL2 and discharge summary faxed to facility [phone]). CSW confirmed receipt with Latoya and the facility can accept the patient back today. Son notified of discharge. Medical necessity form done; PTAR scheduled. Discharge packet completed. RN updated. TOC signing off.  Final next level of care: Memory Care Barriers to Discharge: Barriers Resolved  Patient Goals and CMS Choice CMS Medicare.gov Compare Post Acute Care list provided to:: Patient Represenative (must comment) Choice offered to / list presented to : Adult Children  Discharge Placement   Patient chooses bed at: Other - please specify in the comment section below: [facility] memory care) Patient to be transferred to facility by: PTAR Name of family member notified: Achintya Terrero (son) Patient and family notified of of transfer: 08/15/23  Discharge Plan and Services Additional resources added to the After Visit Summary for   In-house Referral: Clinical Social Work       DME Arranged: N/A DME Agency: NA  Social Determinants of Health (SDOH) Interventions SDOH Screenings   Food Insecurity: No Food Insecurity (05/10/2023)  Housing: Low Risk  (05/10/2023)  Transportation Needs: No Transportation Needs (08/04/2023)   Received from [facility]  Utilities: Not At Risk (05/10/2023)  Alcohol Screen: Low Risk  (04/19/2023)  Depression (PHQ2-9): Low Risk  (04/19/2023)  Financial Resource Strain: Medium Risk (04/19/2023)  Physical Activity: Inactive  (04/19/2023)  Social Connections: Unknown (08/03/2023)   Received from [facility]  Stress: Stress Concern Present (04/19/2023)  Tobacco Use: Medium Risk (08/12/2023)   Readmission Risk Interventions    08/14/2023    1:14 PM 11/14/2022   12:51 PM  Readmission Risk Prevention Plan  Transportation Screening Complete Complete  PCP or Specialist Appt within 3-5 Days  Complete  HRI or Home Care Consult  Complete  Social Work Consult for Recovery Care Planning/Counseling  Complete  Palliative Care Screening  Not Applicable  Medication Review [employer])  Referral to Pharmacy  PCP or Specialist appointment within 3-5 days of discharge Complete   HRI or Home Care Consult Complete   SW Recovery Care/Counseling Consult Complete   Palliative Care Screening Not Applicable   Skilled Nursing Facility Not Applicable

## 2023-08-15 NOTE — Plan of Care (Signed)
  Problem: Fluid Volume: Goal: Hemodynamic stability will improve Outcome: Progressing   Problem: Clinical Measurements: Goal: Diagnostic test results will improve Outcome: Progressing Goal: Signs and symptoms of infection will decrease Outcome: Progressing   Problem: Respiratory: Goal: Ability to maintain adequate ventilation will improve Outcome: Progressing   Problem: Education: Goal: Ability to describe self-care measures that may prevent or decrease complications (Diabetes Survival Skills Education) will improve Outcome: Progressing Goal: Individualized Educational Video(s) Outcome: Progressing   Problem: Coping: Goal: Ability to adjust to condition or change in health will improve Outcome: Progressing   Problem: Fluid Volume: Goal: Ability to maintain a balanced intake and output will improve Outcome: Progressing   Problem: Health Behavior/Discharge Planning: Goal: Ability to identify and utilize available resources and services will improve Outcome: Progressing Goal: Ability to manage health-related needs will improve Outcome: Progressing   Problem: Metabolic: Goal: Ability to maintain appropriate glucose levels will improve Outcome: Progressing   Problem: Nutritional: Goal: Maintenance of adequate nutrition will improve Outcome: Progressing Goal: Progress toward achieving an optimal weight will improve Outcome: Progressing   Problem: Skin Integrity: Goal: Risk for impaired skin integrity will decrease Outcome: Progressing   Problem: Tissue Perfusion: Goal: Adequacy of tissue perfusion will improve Outcome: Progressing   Problem: Activity: Goal: Ability to tolerate increased activity will improve Outcome: Progressing   Problem: Clinical Measurements: Goal: Ability to maintain a body temperature in the normal range will improve Outcome: Progressing   Problem: Respiratory: Goal: Ability to maintain adequate ventilation will improve Outcome:  Progressing Goal: Ability to maintain a clear airway will improve Outcome: Progressing   Problem: Education: Goal: Knowledge of risk factors and measures for prevention of condition will improve Outcome: Progressing   Problem: Coping: Goal: Psychosocial and spiritual needs will be supported Outcome: Progressing   Problem: Respiratory: Goal: Will maintain a patent airway Outcome: Progressing Goal: Complications related to the disease process, condition or treatment will be avoided or minimized Outcome: Progressing   Problem: Education: Goal: Knowledge of General Education information will improve Description: Including pain rating scale, medication(s)/side effects and non-pharmacologic comfort measures Outcome: Progressing   Problem: Health Behavior/Discharge Planning: Goal: Ability to manage health-related needs will improve Outcome: Progressing   Problem: Clinical Measurements: Goal: Ability to maintain clinical measurements within normal limits will improve Outcome: Progressing Goal: Will remain free from infection Outcome: Progressing Goal: Diagnostic test results will improve Outcome: Progressing Goal: Respiratory complications will improve Outcome: Progressing Goal: Cardiovascular complication will be avoided Outcome: Progressing   Problem: Activity: Goal: Risk for activity intolerance will decrease Outcome: Progressing   Problem: Nutrition: Goal: Adequate nutrition will be maintained Outcome: Progressing   Problem: Coping: Goal: Level of anxiety will decrease Outcome: Progressing   Problem: Elimination: Goal: Will not experience complications related to bowel motility Outcome: Progressing Goal: Will not experience complications related to urinary retention Outcome: Progressing   Problem: Pain Managment: Goal: General experience of comfort will improve Outcome: Progressing   Problem: Safety: Goal: Ability to remain free from injury will  improve Outcome: Progressing   Problem: Skin Integrity: Goal: Risk for impaired skin integrity will decrease Outcome: Progressing

## 2023-08-18 LAB — CULTURE, BLOOD (ROUTINE X 2)
Culture: NO GROWTH
Culture: NO GROWTH

## 2023-08-21 DIAGNOSIS — N390 Urinary tract infection, site not specified: Secondary | ICD-10-CM | POA: Diagnosis not present

## 2023-08-21 DIAGNOSIS — G309 Alzheimer's disease, unspecified: Secondary | ICD-10-CM | POA: Diagnosis not present

## 2023-08-21 DIAGNOSIS — I10 Essential (primary) hypertension: Secondary | ICD-10-CM | POA: Diagnosis not present

## 2023-08-21 DIAGNOSIS — J189 Pneumonia, unspecified organism: Secondary | ICD-10-CM | POA: Diagnosis not present

## 2023-08-21 DIAGNOSIS — R2681 Unsteadiness on feet: Secondary | ICD-10-CM | POA: Diagnosis not present

## 2023-08-22 DIAGNOSIS — E559 Vitamin D deficiency, unspecified: Secondary | ICD-10-CM | POA: Diagnosis not present

## 2023-08-22 DIAGNOSIS — Z131 Encounter for screening for diabetes mellitus: Secondary | ICD-10-CM | POA: Diagnosis not present

## 2023-08-22 DIAGNOSIS — E785 Hyperlipidemia, unspecified: Secondary | ICD-10-CM | POA: Diagnosis not present

## 2023-08-22 DIAGNOSIS — G309 Alzheimer's disease, unspecified: Secondary | ICD-10-CM | POA: Diagnosis not present

## 2023-08-22 DIAGNOSIS — I10 Essential (primary) hypertension: Secondary | ICD-10-CM | POA: Diagnosis not present

## 2023-08-28 DIAGNOSIS — N390 Urinary tract infection, site not specified: Secondary | ICD-10-CM | POA: Diagnosis not present

## 2023-08-28 DIAGNOSIS — B372 Candidiasis of skin and nail: Secondary | ICD-10-CM | POA: Diagnosis not present

## 2023-08-28 DIAGNOSIS — G309 Alzheimer's disease, unspecified: Secondary | ICD-10-CM | POA: Diagnosis not present

## 2023-08-28 DIAGNOSIS — F02C18 Dementia in other diseases classified elsewhere, severe, with other behavioral disturbance: Secondary | ICD-10-CM | POA: Diagnosis not present

## 2023-08-28 DIAGNOSIS — F419 Anxiety disorder, unspecified: Secondary | ICD-10-CM | POA: Diagnosis not present

## 2023-08-28 DIAGNOSIS — R7303 Prediabetes: Secondary | ICD-10-CM | POA: Diagnosis not present

## 2023-08-31 DIAGNOSIS — R262 Difficulty in walking, not elsewhere classified: Secondary | ICD-10-CM | POA: Diagnosis not present

## 2023-08-31 DIAGNOSIS — M6281 Muscle weakness (generalized): Secondary | ICD-10-CM | POA: Diagnosis not present

## 2023-08-31 DIAGNOSIS — B351 Tinea unguium: Secondary | ICD-10-CM | POA: Diagnosis not present

## 2023-08-31 DIAGNOSIS — I7091 Generalized atherosclerosis: Secondary | ICD-10-CM | POA: Diagnosis not present

## 2023-08-31 DIAGNOSIS — R2681 Unsteadiness on feet: Secondary | ICD-10-CM | POA: Diagnosis not present

## 2023-09-03 DIAGNOSIS — R262 Difficulty in walking, not elsewhere classified: Secondary | ICD-10-CM | POA: Diagnosis not present

## 2023-09-03 DIAGNOSIS — M6281 Muscle weakness (generalized): Secondary | ICD-10-CM | POA: Diagnosis not present

## 2023-09-03 DIAGNOSIS — R2681 Unsteadiness on feet: Secondary | ICD-10-CM | POA: Diagnosis not present

## 2023-09-05 DIAGNOSIS — R2681 Unsteadiness on feet: Secondary | ICD-10-CM | POA: Diagnosis not present

## 2023-09-05 DIAGNOSIS — M6281 Muscle weakness (generalized): Secondary | ICD-10-CM | POA: Diagnosis not present

## 2023-09-05 DIAGNOSIS — R262 Difficulty in walking, not elsewhere classified: Secondary | ICD-10-CM | POA: Diagnosis not present

## 2023-09-09 DIAGNOSIS — M6281 Muscle weakness (generalized): Secondary | ICD-10-CM | POA: Diagnosis not present

## 2023-09-09 DIAGNOSIS — R2681 Unsteadiness on feet: Secondary | ICD-10-CM | POA: Diagnosis not present

## 2023-09-09 DIAGNOSIS — R262 Difficulty in walking, not elsewhere classified: Secondary | ICD-10-CM | POA: Diagnosis not present

## 2023-09-11 DIAGNOSIS — F419 Anxiety disorder, unspecified: Secondary | ICD-10-CM | POA: Diagnosis not present

## 2023-09-11 DIAGNOSIS — G309 Alzheimer's disease, unspecified: Secondary | ICD-10-CM | POA: Diagnosis not present

## 2023-09-11 DIAGNOSIS — F02C18 Dementia in other diseases classified elsewhere, severe, with other behavioral disturbance: Secondary | ICD-10-CM | POA: Diagnosis not present

## 2023-09-18 ENCOUNTER — Inpatient Hospital Stay (HOSPITAL_COMMUNITY)
Admission: EM | Admit: 2023-09-18 | Discharge: 2023-09-26 | DRG: 871 | Disposition: A | Discharge status: Skilled Nursing Facility | Payer: Medicare HMO | Source: Skilled Nursing Facility | Attending: Internal Medicine | Admitting: Internal Medicine

## 2023-09-18 ENCOUNTER — Encounter (HOSPITAL_COMMUNITY): Payer: Self-pay | Admitting: Internal Medicine

## 2023-09-18 ENCOUNTER — Emergency Department (HOSPITAL_COMMUNITY): Payer: Medicare HMO

## 2023-09-18 DIAGNOSIS — I213 ST elevation (STEMI) myocardial infarction of unspecified site: Secondary | ICD-10-CM | POA: Diagnosis not present

## 2023-09-18 DIAGNOSIS — A419 Sepsis, unspecified organism: Secondary | ICD-10-CM | POA: Diagnosis not present

## 2023-09-18 DIAGNOSIS — R0989 Other specified symptoms and signs involving the circulatory and respiratory systems: Secondary | ICD-10-CM | POA: Diagnosis not present

## 2023-09-18 DIAGNOSIS — Z1624 Resistance to multiple antibiotics: Secondary | ICD-10-CM | POA: Diagnosis present

## 2023-09-18 DIAGNOSIS — Z8616 Personal history of COVID-19: Secondary | ICD-10-CM

## 2023-09-18 DIAGNOSIS — A4151 Sepsis due to Escherichia coli [E. coli]: Secondary | ICD-10-CM | POA: Diagnosis not present

## 2023-09-18 DIAGNOSIS — R456 Violent behavior: Secondary | ICD-10-CM | POA: Diagnosis not present

## 2023-09-18 DIAGNOSIS — G9341 Metabolic encephalopathy: Secondary | ICD-10-CM | POA: Diagnosis present

## 2023-09-18 DIAGNOSIS — N1832 Chronic kidney disease, stage 3b: Secondary | ICD-10-CM | POA: Diagnosis present

## 2023-09-18 DIAGNOSIS — N179 Acute kidney failure, unspecified: Secondary | ICD-10-CM | POA: Diagnosis present

## 2023-09-18 DIAGNOSIS — F05 Delirium due to known physiological condition: Secondary | ICD-10-CM | POA: Diagnosis present

## 2023-09-18 DIAGNOSIS — A498 Other bacterial infections of unspecified site: Secondary | ICD-10-CM | POA: Diagnosis not present

## 2023-09-18 DIAGNOSIS — Z96642 Presence of left artificial hip joint: Secondary | ICD-10-CM | POA: Diagnosis present

## 2023-09-18 DIAGNOSIS — S0011XA Contusion of right eyelid and periocular area, initial encounter: Secondary | ICD-10-CM | POA: Diagnosis present

## 2023-09-18 DIAGNOSIS — S0990XA Unspecified injury of head, initial encounter: Secondary | ICD-10-CM | POA: Diagnosis not present

## 2023-09-18 DIAGNOSIS — W19XXXA Unspecified fall, initial encounter: Secondary | ICD-10-CM | POA: Diagnosis present

## 2023-09-18 DIAGNOSIS — E1140 Type 2 diabetes mellitus with diabetic neuropathy, unspecified: Secondary | ICD-10-CM | POA: Diagnosis present

## 2023-09-18 DIAGNOSIS — I959 Hypotension, unspecified: Secondary | ICD-10-CM | POA: Diagnosis present

## 2023-09-18 DIAGNOSIS — I252 Old myocardial infarction: Secondary | ICD-10-CM

## 2023-09-18 DIAGNOSIS — Z7189 Other specified counseling: Secondary | ICD-10-CM | POA: Diagnosis not present

## 2023-09-18 DIAGNOSIS — N390 Urinary tract infection, site not specified: Secondary | ICD-10-CM | POA: Diagnosis present

## 2023-09-18 DIAGNOSIS — J9811 Atelectasis: Secondary | ICD-10-CM | POA: Diagnosis not present

## 2023-09-18 DIAGNOSIS — R7881 Bacteremia: Secondary | ICD-10-CM

## 2023-09-18 DIAGNOSIS — R262 Difficulty in walking, not elsewhere classified: Secondary | ICD-10-CM | POA: Diagnosis not present

## 2023-09-18 DIAGNOSIS — F0390 Unspecified dementia without behavioral disturbance: Secondary | ICD-10-CM | POA: Diagnosis present

## 2023-09-18 DIAGNOSIS — R0602 Shortness of breath: Secondary | ICD-10-CM | POA: Diagnosis not present

## 2023-09-18 DIAGNOSIS — R0902 Hypoxemia: Secondary | ICD-10-CM | POA: Diagnosis not present

## 2023-09-18 DIAGNOSIS — I2510 Atherosclerotic heart disease of native coronary artery without angina pectoris: Secondary | ICD-10-CM | POA: Diagnosis present

## 2023-09-18 DIAGNOSIS — N3000 Acute cystitis without hematuria: Secondary | ICD-10-CM | POA: Diagnosis not present

## 2023-09-18 DIAGNOSIS — R1310 Dysphagia, unspecified: Secondary | ICD-10-CM | POA: Diagnosis not present

## 2023-09-18 DIAGNOSIS — D6959 Other secondary thrombocytopenia: Secondary | ICD-10-CM | POA: Diagnosis present

## 2023-09-18 DIAGNOSIS — Z8249 Family history of ischemic heart disease and other diseases of the circulatory system: Secondary | ICD-10-CM

## 2023-09-18 DIAGNOSIS — D509 Iron deficiency anemia, unspecified: Secondary | ICD-10-CM | POA: Diagnosis present

## 2023-09-18 DIAGNOSIS — Z87891 Personal history of nicotine dependence: Secondary | ICD-10-CM

## 2023-09-18 DIAGNOSIS — R569 Unspecified convulsions: Secondary | ICD-10-CM | POA: Diagnosis not present

## 2023-09-18 DIAGNOSIS — R6520 Severe sepsis without septic shock: Secondary | ICD-10-CM | POA: Diagnosis not present

## 2023-09-18 DIAGNOSIS — R2689 Other abnormalities of gait and mobility: Secondary | ICD-10-CM | POA: Diagnosis not present

## 2023-09-18 DIAGNOSIS — M6281 Muscle weakness (generalized): Secondary | ICD-10-CM | POA: Diagnosis not present

## 2023-09-18 DIAGNOSIS — M1990 Unspecified osteoarthritis, unspecified site: Secondary | ICD-10-CM | POA: Diagnosis present

## 2023-09-18 DIAGNOSIS — Z66 Do not resuscitate: Secondary | ICD-10-CM | POA: Diagnosis present

## 2023-09-18 DIAGNOSIS — Z820 Family history of epilepsy and other diseases of the nervous system: Secondary | ICD-10-CM | POA: Diagnosis not present

## 2023-09-18 DIAGNOSIS — I129 Hypertensive chronic kidney disease with stage 1 through stage 4 chronic kidney disease, or unspecified chronic kidney disease: Secondary | ICD-10-CM | POA: Diagnosis present

## 2023-09-18 DIAGNOSIS — E1122 Type 2 diabetes mellitus with diabetic chronic kidney disease: Secondary | ICD-10-CM | POA: Diagnosis present

## 2023-09-18 DIAGNOSIS — Z7401 Bed confinement status: Secondary | ICD-10-CM | POA: Diagnosis not present

## 2023-09-18 DIAGNOSIS — R0600 Dyspnea, unspecified: Secondary | ICD-10-CM | POA: Diagnosis not present

## 2023-09-18 DIAGNOSIS — E119 Type 2 diabetes mellitus without complications: Secondary | ICD-10-CM

## 2023-09-18 DIAGNOSIS — Z1612 Extended spectrum beta lactamase (ESBL) resistance: Secondary | ICD-10-CM | POA: Diagnosis present

## 2023-09-18 DIAGNOSIS — I10 Essential (primary) hypertension: Secondary | ICD-10-CM | POA: Diagnosis present

## 2023-09-18 DIAGNOSIS — R0689 Other abnormalities of breathing: Secondary | ICD-10-CM | POA: Diagnosis not present

## 2023-09-18 DIAGNOSIS — E785 Hyperlipidemia, unspecified: Secondary | ICD-10-CM | POA: Diagnosis present

## 2023-09-18 DIAGNOSIS — I6782 Cerebral ischemia: Secondary | ICD-10-CM | POA: Diagnosis not present

## 2023-09-18 DIAGNOSIS — N189 Chronic kidney disease, unspecified: Secondary | ICD-10-CM

## 2023-09-18 DIAGNOSIS — R000 Tachycardia, unspecified: Secondary | ICD-10-CM | POA: Diagnosis not present

## 2023-09-18 DIAGNOSIS — S0093XA Contusion of unspecified part of head, initial encounter: Secondary | ICD-10-CM | POA: Diagnosis not present

## 2023-09-18 DIAGNOSIS — R2681 Unsteadiness on feet: Secondary | ICD-10-CM | POA: Diagnosis not present

## 2023-09-18 DIAGNOSIS — Z79899 Other long term (current) drug therapy: Secondary | ICD-10-CM

## 2023-09-18 DIAGNOSIS — R4182 Altered mental status, unspecified: Secondary | ICD-10-CM | POA: Diagnosis not present

## 2023-09-18 LAB — COMPREHENSIVE METABOLIC PANEL
ALT: 11 U/L (ref 0–44)
AST: 16 U/L (ref 15–41)
Albumin: 2.9 g/dL — ABNORMAL LOW (ref 3.5–5.0)
Alkaline Phosphatase: 48 U/L (ref 38–126)
Anion gap: 10 (ref 5–15)
BUN: 42 mg/dL — ABNORMAL HIGH (ref 8–23)
CO2: 26 mmol/L (ref 22–32)
Calcium: 8.5 mg/dL — ABNORMAL LOW (ref 8.9–10.3)
Chloride: 105 mmol/L (ref 98–111)
Creatinine, Ser: 2.28 mg/dL — ABNORMAL HIGH (ref 0.61–1.24)
GFR, Estimated: 28 mL/min — ABNORMAL LOW (ref >60)
Glucose, Bld: 90 mg/dL (ref 70–99)
Potassium: 4.8 mmol/L (ref 3.5–5.1)
Sodium: 141 mmol/L (ref 135–145)
Total Bilirubin: 0.9 mg/dL (ref 0.3–1.2)
Total Protein: 5.8 g/dL — ABNORMAL LOW (ref 6.5–8.1)

## 2023-09-18 LAB — URINALYSIS, W/ REFLEX TO CULTURE (INFECTION SUSPECTED)
Bilirubin Urine: NEGATIVE
Glucose, UA: NEGATIVE mg/dL
Ketones, ur: 5 mg/dL — AB
Nitrite: NEGATIVE
Protein, ur: 100 mg/dL — AB
Specific Gravity, Urine: 1.012 (ref 1.005–1.030)
WBC, UA: >50 WBC/hpf (ref 0–5)
pH: 6 (ref 5.0–8.0)

## 2023-09-18 LAB — CBC WITH DIFFERENTIAL/PLATELET
Abs Immature Granulocytes: 0.03 10*3/uL (ref 0.00–0.07)
Basophils Absolute: 0 10*3/uL (ref 0.0–0.1)
Basophils Relative: 0 %
Eosinophils Absolute: 0 10*3/uL (ref 0.0–0.5)
Eosinophils Relative: 0 %
HCT: 32.3 % — ABNORMAL LOW (ref 39.0–52.0)
Hemoglobin: 9.9 g/dL — ABNORMAL LOW (ref 13.0–17.0)
Immature Granulocytes: 0 %
Lymphocytes Relative: 7 %
Lymphs Abs: 0.5 10*3/uL — ABNORMAL LOW (ref 0.7–4.0)
MCH: 27.3 pg (ref 26.0–34.0)
MCHC: 30.7 g/dL (ref 30.0–36.0)
MCV: 89 fL (ref 80.0–100.0)
Monocytes Absolute: 0.3 10*3/uL (ref 0.1–1.0)
Monocytes Relative: 4 %
Neutro Abs: 6 10*3/uL (ref 1.7–7.7)
Neutrophils Relative %: 89 %
Platelets: 54 10*3/uL — ABNORMAL LOW (ref 150–400)
RBC: 3.63 MIL/uL — ABNORMAL LOW (ref 4.22–5.81)
RDW: 14.7 % (ref 11.5–15.5)
WBC: 6.8 10*3/uL (ref 4.0–10.5)
nRBC: 0 % (ref 0.0–0.2)

## 2023-09-18 LAB — TSH: TSH: 3.655 u[IU]/mL (ref 0.350–4.500)

## 2023-09-18 LAB — I-STAT CG4 LACTIC ACID, ED
Lactic Acid, Venous: 1.4 mmol/L (ref 0.5–1.9)
Lactic Acid, Venous: 1.2 mmol/L (ref 0.5–1.9)

## 2023-09-18 MED ORDER — ACETAMINOPHEN 325 MG PO TABS
650.0000 mg | ORAL_TABLET | Freq: Four times a day (QID) | ORAL | Status: DC | PRN
  Administered 2023-09-18 – 2023-09-19 (×3): 650 mg via ORAL
  Filled 2023-09-18 (×3): qty 2

## 2023-09-18 MED ORDER — ALBUTEROL SULFATE (2.5 MG/3ML) 0.083% IN NEBU: 2.5000 mg | INHALATION_SOLUTION | RESPIRATORY_TRACT | Status: DC | PRN

## 2023-09-18 MED ORDER — DIVALPROEX SODIUM 250 MG PO DR TAB
250.0000 mg | DELAYED_RELEASE_TABLET | Freq: Three times a day (TID) | ORAL | Status: DC
  Administered 2023-09-18 – 2023-09-19 (×2): 250 mg via ORAL
  Filled 2023-09-18 (×2): qty 1

## 2023-09-18 MED ORDER — LORAZEPAM 2 MG/ML IJ SOLN
1.0000 mg | Freq: Once | INTRAMUSCULAR | Status: AC
  Administered 2023-09-18: 1 mg via INTRAVENOUS
  Filled 2023-09-18: qty 1

## 2023-09-18 MED ORDER — SODIUM CHLORIDE 0.9 % IV BOLUS
1000.0000 mL | Freq: Once | INTRAVENOUS | Status: AC
  Administered 2023-09-18: 1000 mL via INTRAVENOUS

## 2023-09-18 MED ORDER — HALOPERIDOL LACTATE 5 MG/ML IJ SOLN
2.0000 mg | Freq: Once | INTRAMUSCULAR | Status: AC
  Administered 2023-09-18: 2 mg via INTRAVENOUS
  Filled 2023-09-18: qty 1

## 2023-09-18 MED ORDER — COLESEVELAM HCL 625 MG PO TABS
1875.0000 mg | ORAL_TABLET | Freq: Two times a day (BID) | ORAL | Status: DC
  Administered 2023-09-19 (×2): 1875 mg via ORAL
  Filled 2023-09-18 (×2): qty 3

## 2023-09-18 MED ORDER — FERROUS SULFATE 325 (65 FE) MG PO TABS
325.0000 mg | ORAL_TABLET | Freq: Every day | ORAL | Status: DC
  Administered 2023-09-19: 325 mg via ORAL
  Filled 2023-09-18: qty 1

## 2023-09-18 MED ORDER — ACETAMINOPHEN 650 MG RE SUPP
650.0000 mg | Freq: Four times a day (QID) | RECTAL | Status: DC | PRN
  Filled 2023-09-18: qty 1

## 2023-09-18 MED ORDER — LORAZEPAM 0.5 MG PO TABS
0.5000 mg | ORAL_TABLET | Freq: Every day | ORAL | Status: DC
  Administered 2023-09-19: 0.5 mg via ORAL
  Filled 2023-09-18: qty 1

## 2023-09-18 MED ORDER — SODIUM CHLORIDE 0.9 % IV SOLN
1.0000 g | Freq: Once | INTRAVENOUS | Status: AC
  Administered 2023-09-18: 1 g via INTRAVENOUS
  Filled 2023-09-18: qty 10

## 2023-09-18 MED ORDER — ESCITALOPRAM OXALATE 20 MG PO TABS
10.0000 mg | ORAL_TABLET | Freq: Two times a day (BID) | ORAL | Status: DC
  Administered 2023-09-18 – 2023-09-19 (×2): 10 mg via ORAL
  Filled 2023-09-18 (×2): qty 1

## 2023-09-18 MED ORDER — SODIUM CHLORIDE 0.9 % IV SOLN: INTRAVENOUS | Status: DC

## 2023-09-18 MED ORDER — SODIUM CHLORIDE 0.9 % IV SOLN
1.0000 g | INTRAVENOUS | Status: DC
  Filled 2023-09-18: qty 10

## 2023-09-18 MED ORDER — DONEPEZIL HCL 10 MG PO TABS
10.0000 mg | ORAL_TABLET | Freq: Every day | ORAL | Status: DC
  Administered 2023-09-18 – 2023-09-19 (×2): 10 mg via ORAL
  Filled 2023-09-18 (×2): qty 1

## 2023-09-18 MED ORDER — LORAZEPAM 2 MG/ML IJ SOLN
1.0000 mg | INTRAMUSCULAR | Status: DC | PRN
  Administered 2023-09-20 (×2): 1 mg via INTRAVENOUS
  Filled 2023-09-18 (×2): qty 1

## 2023-09-18 MED ORDER — ONDANSETRON HCL 4 MG PO TABS: 4.0000 mg | ORAL_TABLET | Freq: Four times a day (QID) | ORAL | Status: DC | PRN

## 2023-09-18 MED ORDER — OLANZAPINE 5 MG PO TBDP
5.0000 mg | ORAL_TABLET | Freq: Every day | ORAL | Status: DC
  Administered 2023-09-18: 5 mg via ORAL
  Filled 2023-09-18 (×2): qty 1

## 2023-09-18 MED ORDER — ATORVASTATIN CALCIUM 10 MG PO TABS
20.0000 mg | ORAL_TABLET | Freq: Every day | ORAL | Status: DC
  Administered 2023-09-19: 20 mg via ORAL
  Filled 2023-09-18: qty 2

## 2023-09-18 MED ORDER — ONDANSETRON HCL 4 MG/2ML IJ SOLN: 4.0000 mg | Freq: Four times a day (QID) | INTRAMUSCULAR | Status: DC | PRN

## 2023-09-18 MED ORDER — TRAZODONE HCL 50 MG PO TABS: 25.0000 mg | ORAL_TABLET | Freq: Every evening | ORAL | Status: DC | PRN

## 2023-09-18 MED ORDER — GABAPENTIN 300 MG PO CAPS
300.0000 mg | ORAL_CAPSULE | Freq: Every day | ORAL | Status: DC
  Administered 2023-09-19: 300 mg via ORAL
  Filled 2023-09-18: qty 1

## 2023-09-18 MED ORDER — HYDRALAZINE HCL 20 MG/ML IJ SOLN: 5.0000 mg | Freq: Four times a day (QID) | INTRAMUSCULAR | Status: DC | PRN

## 2023-09-18 NOTE — ED Notes (Signed)
ED TO INPATIENT HANDOFF REPORT  Name/Age/Gender Daniel Sherman 82 y.o. male  Code Status    Code Status Orders  (From admission, onward)           Start     Ordered   09/18/23 1854  Do not attempt resuscitation (DNR)- Limited -Do Not Intubate (DNI)  Continuous       Question Answer Comment  If pulseless and not breathing No CPR or chest compressions.   In Pre-Arrest Conditions (Patient Is Breathing and Has A Pulse) Do not intubate. Provide all appropriate non-invasive medical interventions. Avoid ICU transfer unless indicated or required.   Consent: Discussion documented in EHR or advanced directives reviewed      09/18/23 1854           Code Status History     Date Active Date Inactive Code Status Order ID Comments User Context   08/13/2023 1417 08/16/2023 0315 Limited: Do not attempt resuscitation (DNR) -DNR-LIMITED -Do Not Intubate/DNI  [id number]  Bobette Mo, MD Inpatient   08/13/2023 0845 08/13/2023 1417 Full Code [id number]  Bobette Mo, MD ED   05/10/2023 1033 05/13/2023 1927 Full Code [id number]  Floydene Flock, MD ED   11/11/2022 2141 11/14/2022 1945 Full Code [id number]  Andris Baumann, MD ED   06/17/2021 0014 06/20/2021 1740 Full Code [id number]  Mansy, Vernetta Honey, MD ED   08/12/2019 1745 08/15/2019 1446 Full Code [id number]  Carolan Shiver, MD Inpatient   12/09/2016 2332 12/12/2016 1546 Full Code [id number]  Marguarite Arbour, MD Inpatient   02/15/2015 1202 02/16/2015 1722 Full Code [id number]  Dan Humphreys, PA-C Inpatient       Home/SNF/Other Skilled nursing facility  Chief Complaint UTI (urinary tract infection) [N39.0]  Level of Care/Admitting Diagnosis ED Disposition     ED Disposition  Admit   Condition  --   Comment  Hospital Area: [facility] [[id number]]  Level of Care: Med-Surg [16]  May admit patient to [facility] or [facility] if equivalent level of care is available:: Yes  Covid Evaluation: Asymptomatic - no recent  exposure (last 10 days) testing not required  Diagnosis: UTI (urinary tract infection) [606301]  Admitting Physician: Maryln Gottron [[id number]]  Attending Physician: Olexa.Dam, MIR Jaxson.Roy [[id number]]  Certification:: I certify this patient will need inpatient services for at least 2 midnights  Expected Medical Readiness: 09/20/2023          Medical History Past Medical History:  Diagnosis Date   Arthritis    Coronary artery disease    Dementia (HCC)    Diabetes mellitus without complication (HCC)    GERD (gastroesophageal reflux disease)    Headache    migraines in past - none since started amitriptyline (20 yrs)   Hyperlipidemia    Hypertension    IDA (iron deficiency anemia) 02/22/2020   Memory deficit    Myocardial infarction (HCC)    mild Mi- 1984    PONV (postoperative nausea and vomiting)    "when they used to use gas"   Wears dentures    full upper and lower    Allergies Allergies  Allergen Reactions   Other     BLOOD PRODUCT REFUSAL    IV Location/Drains/Wounds Patient Lines/Drains/Airways Status     Active Line/Drains/Airways     Name Placement date Placement time Site Days   Peripheral IV 09/18/23 20 G Anterior;Left Forearm 09/18/23  1323  Forearm  less than 1   Incision - 4 Ports Abdomen 1:  Umbilicus Left;Lateral Lower;Right Right;Lateral 06/18/21  0938  -- 822   Incision - 1 Port Abdomen Mid 08/12/19  1255  -- 1498   Incision - 2 Ports Abdomen Left;Upper Mid;Upper 08/12/19  1255  -- 1498            Labs/Imaging Results for orders placed or performed during the hospital encounter of 09/18/23 (from the past 48 hour(s))  Comprehensive metabolic panel     Status: Abnormal   Collection Time: 09/18/23  2:26 PM  Result Value Ref Range   Sodium 141 135 - 145 mmol/L   Potassium 4.8 3.5 - 5.1 mmol/L   Chloride 105 98 - 111 mmol/L   CO2 26 22 - 32 mmol/L   Glucose, Bld 90 70 - 99 mg/dL    Comment: Glucose reference range applies only to samples taken  after fasting for at least 8 hours.   BUN 42 (H) 8 - 23 mg/dL   Creatinine, Ser 4.09 (H) 0.61 - 1.24 mg/dL   Calcium 8.5 (L) 8.9 - 10.3 mg/dL   Total Protein 5.8 (L) 6.5 - 8.1 g/dL   Albumin 2.9 (L) 3.5 - 5.0 g/dL   AST 16 15 - 41 U/L   ALT 11 0 - 44 U/L   Alkaline Phosphatase 48 38 - 126 U/L   Total Bilirubin 0.9 0.3 - 1.2 mg/dL   GFR, Estimated 28 (L) >60 mL/min    Comment: (NOTE) Calculated using the CKD-EPI Creatinine Equation (2021)    Anion gap 10 5 - 15    Comment: Performed at [facility], [address] [place], [place] [address]  CBC with Differential     Status: Abnormal   Collection Time: 09/18/23  2:26 PM  Result Value Ref Range   WBC 6.8 4.0 - 10.5 K/uL   RBC 3.63 (L) 4.22 - 5.81 MIL/uL   Hemoglobin 9.9 (L) 13.0 - 17.0 g/dL   HCT 47.8 (L) 29.5 - 62.1 %   MCV 89.0 80.0 - 100.0 fL   MCH 27.3 26.0 - 34.0 pg   MCHC 30.7 30.0 - 36.0 g/dL   RDW 30.8 65.7 - 84.6 %   Platelets 54 (L) 150 - 400 K/uL    Comment: Immature Platelet Fraction may be clinically indicated, consider ordering this additional test [id number]    nRBC 0.0 0.0 - 0.2 %   Neutrophils Relative % 89 %   Neutro Abs 6.0 1.7 - 7.7 K/uL   Lymphocytes Relative 7 %   Lymphs Abs 0.5 (L) 0.7 - 4.0 K/uL   Monocytes Relative 4 %   Monocytes Absolute 0.3 0.1 - 1.0 K/uL   Eosinophils Relative 0 %   Eosinophils Absolute 0.0 0.0 - 0.5 K/uL   Basophils Relative 0 %   Basophils Absolute 0.0 0.0 - 0.1 K/uL   Immature Granulocytes 0 %   Abs Immature Granulocytes 0.03 0.00 - 0.07 K/uL    Comment: Performed at [facility], [address], [place], [place] [address]  Urinalysis, w/ Reflex to Culture (Infection Suspected) -Urine, Clean Catch     Status: Abnormal   Collection Time: 09/18/23  2:26 PM  Result Value Ref Range   Specimen Source URINE, CLEAN CATCH    Color, Urine YELLOW YELLOW   APPearance HAZY (A) CLEAR   Specific Gravity, Urine 1.012 1.005 - 1.030   pH 6.0 5.0 -  8.0   Glucose, UA NEGATIVE NEGATIVE mg/dL   Hgb urine dipstick MODERATE (A) NEGATIVE   Bilirubin Urine  NEGATIVE NEGATIVE   Ketones, ur 5 (A) NEGATIVE mg/dL   Protein, ur 161 (A) NEGATIVE mg/dL   Nitrite NEGATIVE NEGATIVE   Leukocytes,Ua LARGE (A) NEGATIVE   RBC / HPF 21-50 0 - 5 RBC/hpf   WBC, UA >50 0 - 5 WBC/hpf    Comment:        Reflex urine culture not performed if WBC <=10, OR if Squamous epithelial cells >5. If Squamous epithelial cells >5 suggest recollection.    Bacteria, UA RARE (A) NONE SEEN   Squamous Epithelial / HPF 0-5 0 - 5 /HPF   WBC Clumps PRESENT    Mucus PRESENT     Comment: Performed at [facility], [address] [place], [place] [address]  TSH     Status: None   Collection Time: 09/18/23  2:26 PM  Result Value Ref Range   TSH 3.655 0.350 - 4.500 uIU/mL    Comment: Performed by a 3rd Generation assay with a functional sensitivity of <=0.01 uIU/mL. Performed at [facility], [address] [place], [place] [address]   I-Stat Lactic Acid     Status: None   Collection Time: 09/18/23  3:04 PM  Result Value Ref Range   Lactic Acid, Venous 1.4 0.5 - 1.9 mmol/L  I-Stat Lactic Acid     Status: None   Collection Time: 09/18/23  7:04 PM  Result Value Ref Range   Lactic Acid, Venous 1.2 0.5 - 1.9 mmol/L   DG Chest Portable 1 View  Result Date: 09/18/2023 CLINICAL DATA:  Dyspnea.  Altered mental status. EXAM: PORTABLE CHEST 1 VIEW COMPARISON:  08/13/2023. FINDINGS: Low lung volume. Bilateral lung fields are clear. Bilateral costophrenic angles are clear. Normal cardio-mediastinal silhouette. No acute osseous abnormalities. The soft tissues are within normal limits. IMPRESSION: No active disease. Electronically Signed   By: Jules Schick M.D.   On: 09/18/2023 17:02   CT Head Wo Contrast  Result Date: 09/18/2023 CLINICAL DATA:  Urinary tract infection.  Head trauma. EXAM: CT HEAD WITHOUT CONTRAST TECHNIQUE: Contiguous  axial images were obtained from the base of the skull through the vertex without intravenous contrast. RADIATION DOSE REDUCTION: This exam was performed according to the departmental dose-optimization program which includes automated exposure control, adjustment of the mA and/or kV according to patient size and/or use of iterative reconstruction technique. COMPARISON:  08/12/2023 FINDINGS: Brain: The study suffers from considerable motion degradation. 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 I do not think there is convincing evidence of intracranial hemorrhage. Detection hardware suggests possible small amount of subarachnoid hemorrhage on the right but I think this is probably due to motion degradation. Vascular: There is atherosclerotic calcification of the major vessels at the base of the brain. Skull: No skull fracture. Sinuses/Orbits: Clear/normal Other: None IMPRESSION: Motion degraded study. No definite acute finding. Atrophy and chronic small-vessel ischemic changes of the white matter. Electronically Signed   By: Paulina Fusi M.D.   On: 09/18/2023 15:47    Pending Labs Unresulted Labs (From admission, onward)     Start     Ordered   09/19/23 0500  Basic metabolic panel  Tomorrow morning,   R        09/18/23 1854   09/19/23 0500  CBC  Tomorrow morning,   R        09/18/23 1854   09/18/23  1427  Culture, blood (routine x 2)  BLOOD CULTURE X 2,   R (with STAT occurrences)      09/18/23 1426   09/18/23 1426  Urine Culture  Once,   R        09/18/23 1426            Vitals/Pain Today's Vitals   09/18/23 1455 09/18/23 1507 09/18/23 1830 09/18/23 1859  BP:  (!) 152/96 (!) 152/92   Pulse:  87 86   Resp:  19 20   Temp: 97.7 F (36.5 C) 97.7 F (36.5 C)  97.6 F (36.4 C)  TempSrc: Oral Oral  Oral  SpO2:  97% 100%      Isolation Precautions No active isolations  Medications Medications  cefTRIAXone (ROCEPHIN) 1 g in sodium chloride 0.9 % 100 mL IVPB (1 g Intravenous New Bag/Given 09/18/23 1853)  atorvastatin (LIPITOR) tablet 20 mg (has no administration in time range)  colesevelam [facility]) tablet 1,875 mg (has no administration in time range)  donepezil (ARICEPT) tablet 10 mg (has no administration in time range)  escitalopram (LEXAPRO) tablet 10 mg (has no administration in time range)  LORazepam (ATIVAN) tablet 0.5 mg (has no administration in time range)  OLANZapine zydis (ZYPREXA) disintegrating tablet 5 mg (has no administration in time range)  ferrous sulfate tablet 325 mg (has no administration in time range)  divalproex (DEPAKOTE) DR tablet 250 mg (has no administration in time range)  gabapentin (NEURONTIN) capsule 300 mg (has no administration in time range)  0.9 %  sodium chloride infusion (has no administration in time range)  acetaminophen (TYLENOL) tablet 650 mg (has no administration in time range)    Or  acetaminophen (TYLENOL) suppository 650 mg (has no administration in time range)  traZODone (DESYREL) tablet 25 mg (has no administration in time range)  ondansetron (ZOFRAN) tablet 4 mg (has no administration in time range)    Or  ondansetron (ZOFRAN) injection 4 mg (has no administration in time range)  albuterol (PROVENTIL) (2.5 MG/3ML) 0.083% nebulizer solution 2.5 mg (has no administration in time range)  hydrALAZINE (APRESOLINE) injection 5 mg (has no administration in time range)  cefTRIAXone (ROCEPHIN) 1 g in sodium chloride 0.9 % 100 mL IVPB (has no administration in time range)  LORazepam (ATIVAN) injection 1 mg (has no administration in time range)  sodium chloride 0.9 % bolus 1,000 mL (0 mLs Intravenous Stopped 09/18/23 1643)  haloperidol lactate (HALDOL) injection 2 mg (2 mg Intravenous Given 09/18/23 1631)  LORazepam (ATIVAN) injection 1 mg (1 mg Intravenous Given  09/18/23 1759)    Mobility walks with person assist
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
## 2023-09-18 NOTE — ED Triage Notes (Addendum)
Pt BIBA from [facility] for UTI evaluation. Pt becomes violent when having confirmed diagnosis of UTI based on past history. A&Ox1. AMS.  80/40 HR 102 23-27 94% RA 20 LFA

## 2023-09-18 NOTE — ED Provider Notes (Signed)
Care transferred to me. Labs show an AKI (baseline Cr around 1.6) and UA c/w UTI. Given his change in mental status from baseline/weakness, will admit for treatment of AKI and UTI.  Dr. Kirby Crigler will admit.   Pricilla Loveless, MD 09/18/23 [phone]

## 2023-09-18 NOTE — ED Notes (Signed)
7:17 PM Report received from previous RN. This RN assumes care of the patient.

## 2023-09-19 ENCOUNTER — Other Ambulatory Visit: Payer: Self-pay

## 2023-09-19 ENCOUNTER — Inpatient Hospital Stay (HOSPITAL_COMMUNITY): Payer: Medicare HMO

## 2023-09-19 DIAGNOSIS — A419 Sepsis, unspecified organism: Secondary | ICD-10-CM

## 2023-09-19 DIAGNOSIS — R6520 Severe sepsis without septic shock: Secondary | ICD-10-CM

## 2023-09-19 LAB — BLOOD CULTURE ID PANEL (REFLEXED) - BCID2
A.calcoaceticus-baumannii: NOT DETECTED
Bacteroides fragilis: NOT DETECTED
CTX-M ESBL: DETECTED — AB
Candida albicans: NOT DETECTED
Candida auris: NOT DETECTED
Candida glabrata: NOT DETECTED
Candida krusei: NOT DETECTED
Candida parapsilosis: NOT DETECTED
Candida tropicalis: NOT DETECTED
Carbapenem resist OXA 48 LIKE: NOT DETECTED
Carbapenem resistance IMP: NOT DETECTED
Carbapenem resistance KPC: NOT DETECTED
Carbapenem resistance NDM: NOT DETECTED
Carbapenem resistance VIM: NOT DETECTED
Cryptococcus neoformans/gattii: NOT DETECTED
Enterobacter cloacae complex: NOT DETECTED
Enterobacterales: DETECTED — AB
Enterococcus Faecium: NOT DETECTED
Enterococcus faecalis: NOT DETECTED
Escherichia coli: DETECTED — AB
Haemophilus influenzae: NOT DETECTED
Klebsiella aerogenes: NOT DETECTED
Klebsiella oxytoca: NOT DETECTED
Klebsiella pneumoniae: NOT DETECTED
Listeria monocytogenes: NOT DETECTED
Methicillin resistance mecA/C: NOT DETECTED
Neisseria meningitidis: NOT DETECTED
Proteus species: NOT DETECTED
Pseudomonas aeruginosa: NOT DETECTED
Salmonella species: NOT DETECTED
Serratia marcescens: NOT DETECTED
Staphylococcus aureus (BCID): NOT DETECTED
Staphylococcus epidermidis: DETECTED — AB
Staphylococcus lugdunensis: NOT DETECTED
Staphylococcus species: DETECTED — AB
Stenotrophomonas maltophilia: NOT DETECTED
Streptococcus agalactiae: NOT DETECTED
Streptococcus pneumoniae: NOT DETECTED
Streptococcus pyogenes: NOT DETECTED
Streptococcus species: NOT DETECTED

## 2023-09-19 LAB — CBC WITH DIFFERENTIAL/PLATELET
Abs Immature Granulocytes: 0.04 10*3/uL (ref 0.00–0.07)
Basophils Absolute: 0 10*3/uL (ref 0.0–0.1)
Basophils Relative: 0 %
Eosinophils Absolute: 0 10*3/uL (ref 0.0–0.5)
Eosinophils Relative: 0 %
HCT: 32.4 % — ABNORMAL LOW (ref 39.0–52.0)
Hemoglobin: 10 g/dL — ABNORMAL LOW (ref 13.0–17.0)
Immature Granulocytes: 1 %
Lymphocytes Relative: 7 %
Lymphs Abs: 0.2 10*3/uL — ABNORMAL LOW (ref 0.7–4.0)
MCH: 27.8 pg (ref 26.0–34.0)
MCHC: 30.9 g/dL (ref 30.0–36.0)
MCV: 90 fL (ref 80.0–100.0)
Monocytes Absolute: 0.1 10*3/uL (ref 0.1–1.0)
Monocytes Relative: 4 %
Neutro Abs: 3 10*3/uL (ref 1.7–7.7)
Neutrophils Relative %: 88 %
Platelets: 48 10*3/uL — ABNORMAL LOW (ref 150–400)
RBC: 3.6 MIL/uL — ABNORMAL LOW (ref 4.22–5.81)
RDW: 15 % (ref 11.5–15.5)
WBC: 3.4 10*3/uL — ABNORMAL LOW (ref 4.0–10.5)
nRBC: 0 % (ref 0.0–0.2)

## 2023-09-19 LAB — COMPREHENSIVE METABOLIC PANEL
ALT: 12 U/L (ref 0–44)
AST: 20 U/L (ref 15–41)
Albumin: 2.9 g/dL — ABNORMAL LOW (ref 3.5–5.0)
Alkaline Phosphatase: 51 U/L (ref 38–126)
Anion gap: 10 (ref 5–15)
BUN: 38 mg/dL — ABNORMAL HIGH (ref 8–23)
CO2: 24 mmol/L (ref 22–32)
Calcium: 8 mg/dL — ABNORMAL LOW (ref 8.9–10.3)
Chloride: 108 mmol/L (ref 98–111)
Creatinine, Ser: 2.16 mg/dL — ABNORMAL HIGH (ref 0.61–1.24)
GFR, Estimated: 30 mL/min — ABNORMAL LOW (ref >60)
Glucose, Bld: 120 mg/dL — ABNORMAL HIGH (ref 70–99)
Potassium: 4.2 mmol/L (ref 3.5–5.1)
Sodium: 142 mmol/L (ref 135–145)
Total Bilirubin: 0.8 mg/dL (ref 0.3–1.2)
Total Protein: 5.9 g/dL — ABNORMAL LOW (ref 6.5–8.1)

## 2023-09-19 LAB — CBC
HCT: 29.2 % — ABNORMAL LOW (ref 39.0–52.0)
Hemoglobin: 9 g/dL — ABNORMAL LOW (ref 13.0–17.0)
MCH: 27.7 pg (ref 26.0–34.0)
MCHC: 30.8 g/dL (ref 30.0–36.0)
MCV: 89.8 fL (ref 80.0–100.0)
Platelets: 51 10*3/uL — ABNORMAL LOW (ref 150–400)
RBC: 3.25 MIL/uL — ABNORMAL LOW (ref 4.22–5.81)
RDW: 15 % (ref 11.5–15.5)
WBC: 5.7 10*3/uL (ref 4.0–10.5)
nRBC: 0 % (ref 0.0–0.2)

## 2023-09-19 LAB — BASIC METABOLIC PANEL
Anion gap: 8 (ref 5–15)
BUN: 40 mg/dL — ABNORMAL HIGH (ref 8–23)
CO2: 23 mmol/L (ref 22–32)
Calcium: 7.8 mg/dL — ABNORMAL LOW (ref 8.9–10.3)
Chloride: 107 mmol/L (ref 98–111)
Creatinine, Ser: 2.21 mg/dL — ABNORMAL HIGH (ref 0.61–1.24)
GFR, Estimated: 29 mL/min — ABNORMAL LOW (ref >60)
Glucose, Bld: 108 mg/dL — ABNORMAL HIGH (ref 70–99)
Potassium: 4.2 mmol/L (ref 3.5–5.1)
Sodium: 138 mmol/L (ref 135–145)

## 2023-09-19 LAB — MRSA NEXT GEN BY PCR, NASAL: MRSA by PCR Next Gen: NOT DETECTED

## 2023-09-19 LAB — GLUCOSE, CAPILLARY
Glucose-Capillary: 74 mg/dL (ref 70–99)
Glucose-Capillary: 104 mg/dL — ABNORMAL HIGH (ref 70–99)
Glucose-Capillary: 140 mg/dL — ABNORMAL HIGH (ref 70–99)

## 2023-09-19 LAB — VALPROIC ACID LEVEL: Valproic Acid Lvl: 36 ug/mL — ABNORMAL LOW (ref 50.0–100.0)

## 2023-09-19 LAB — VITAMIN B12: Vitamin B-12: 536 pg/mL (ref 180–914)

## 2023-09-19 LAB — AMMONIA: Ammonia: 21 umol/L (ref 9–35)

## 2023-09-19 LAB — LACTIC ACID, PLASMA
Lactic Acid, Venous: 1.5 mmol/L (ref 0.5–1.9)
Lactic Acid, Venous: 0.8 mmol/L (ref 0.5–1.9)

## 2023-09-19 MED ORDER — SODIUM CHLORIDE 0.9 % IV SOLN: 2.0000 g | INTRAVENOUS | Status: DC

## 2023-09-19 MED ORDER — VALPROATE SODIUM 100 MG/ML IV SOLN
250.0000 mg | Freq: Three times a day (TID) | INTRAVENOUS | Status: DC
  Administered 2023-09-19 – 2023-09-21 (×5): 250 mg via INTRAVENOUS
  Filled 2023-09-19 (×8): qty 2.5

## 2023-09-19 MED ORDER — DEXTROSE IN LACTATED RINGERS 5 % IV SOLN: INTRAVENOUS | Status: AC

## 2023-09-19 MED ORDER — CHLORHEXIDINE GLUCONATE CLOTH 2 % EX PADS
6.0000 | MEDICATED_PAD | Freq: Every day | CUTANEOUS | Status: DC
  Administered 2023-09-19 – 2023-09-21 (×3): 6 via TOPICAL

## 2023-09-19 MED ORDER — HALOPERIDOL LACTATE 5 MG/ML IJ SOLN
2.0000 mg | Freq: Four times a day (QID) | INTRAMUSCULAR | Status: DC | PRN
  Administered 2023-09-20: 2 mg via INTRAVENOUS
  Filled 2023-09-19 (×2): qty 1

## 2023-09-19 MED ORDER — GABAPENTIN 300 MG PO CAPS: 300.0000 mg | ORAL_CAPSULE | Freq: Every day | ORAL | Status: DC

## 2023-09-19 MED ORDER — SODIUM CHLORIDE 0.9 % IV SOLN
1.0000 g | Freq: Two times a day (BID) | INTRAVENOUS | Status: DC
  Administered 2023-09-19 – 2023-09-22 (×6): 1 g via INTRAVENOUS
  Filled 2023-09-19 (×7): qty 20

## 2023-09-19 MED ORDER — SODIUM CHLORIDE 0.9 % IV BOLUS
1000.0000 mL | Freq: Once | INTRAVENOUS | Status: AC
  Administered 2023-09-19: 1000 mL via INTRAVENOUS

## 2023-09-19 MED ORDER — DEXTROSE 50 % IV SOLN
1.0000 | Freq: Once | INTRAVENOUS | Status: AC
  Administered 2023-09-19: 50 mL via INTRAVENOUS
  Filled 2023-09-19: qty 50

## 2023-09-19 NOTE — TOC Initial Note (Signed)
Transition of Care [facility]) - Initial/Assessment Note    Patient Details  Name: Daniel Sherman MRN: [id number] Date of Birth: Jan 25, 1941  Transition of Care [facility]) CM/SW Contact:    Harriett Sine, RN Phone Number:.ee  09/19/2023, 11:02 AM  Clinical Narrative:                 Pt from [facility], Spoke with pt at bedside, called son about plans to  return SNF [facility] [phone], left message. [facility] [phone] spoke with Resident Coordinator to confirm plans for pt to return to facility after d/c. No SDOH , TOC following   Expected Discharge Plan: Skilled Nursing Facility Barriers to Discharge: No Barriers Identified   Patient Goals and CMS Choice   CMS Medicare.gov Compare Post Acute Care list provided to:: Other (Comment Required) (Teale,Shane (Son)  [phone] (Mobile)) Choice offered to / list presented to : Adult Children      Expected Discharge Plan and Services In-house Referral: NA Discharge Planning Services: NA   Living arrangements for the past 2 months: Skilled Nursing Facility [facility])                   DME Agency: NA       HH Arranged:  (NA)          Prior Living Arrangements/Services Living arrangements for the past 2 months: Skilled Nursing Facility [facility]) Lives with:: Facility Resident Patient language and need for interpreter reviewed:: Yes Do you feel safe going back to the place where you live?: Yes Kanard, Fors (Son)  [phone] (Mobile))      Need for Family Participation in Patient Care: Yes (Comment) Care giver support system in place?: Yes (comment) Current home services:  (none) Criminal Activity/Legal Involvement Pertinent to Current Situation/Hospitalization: No - Comment as needed  Activities of Daily Living   ADL Screening (condition at time of admission) Independently performs ADLs?: No Does the patient have a NEW difficulty with bathing/dressing/toileting/self-feeding that  is expected to last >3 days?: No Does the patient have a NEW difficulty with getting in/out of bed, walking, or climbing stairs that is expected to last >3 days?: No Does the patient have a NEW difficulty with communication that is expected to last >3 days?: No Is the patient deaf or have difficulty hearing?: No Does the patient have difficulty seeing, even when wearing glasses/contacts?: No Does the patient have difficulty concentrating, remembering, or making decisions?: Yes  Permission Sought/Granted Permission sought to share information with : Oceanographer granted to share information with : Yes, Verbal Permission Granted Hilal, Raimondo (Son)  [phone] (Mobile))              Emotional Assessment Appearance:: Disheveled Attitude/Demeanor/Rapport: Lethargic Affect (typically observed): Calm Orientation: :  (Disoriented x4) Alcohol / Substance Use: Other (comment) (quit smoking) Psych Involvement: No (comment)  Admission diagnosis:  UTI (urinary tract infection) [N39.0] Acute urinary tract infection [N39.0] Acute kidney injury superimposed on chronic kidney disease (HCC) [N17.9, N18.9] Patient Active Problem List   Diagnosis Date Noted   Acute respiratory failure with hypoxia (HCC) 08/14/2023   CAP (community acquired pneumonia) 08/13/2023   Hyperlipidemia 08/13/2023   COVID-19 08/13/2023   UTI (urinary tract infection) 05/10/2023   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   Thyroid nodule 01/18/2023   Pancytopenia (HCC) 11/14/2022   Sepsis (HCC) 11/13/2022   Dementia without behavioral disturbance (HCC) 11/13/2022   Primary hypertension 11/13/2022   Encephalopathy 11/12/2022  Acute metabolic encephalopathy 11/11/2022   Acute renal failure superimposed on stage 3b chronic kidney disease (HCC) 11/11/2022   Urinary tract infection 11/11/2022   Coronary atherosclerosis of native coronary artery 11/11/2022   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   Late onset Alzheimer's disease without behavioral disturbance (HCC) 01/13/2022   Gastroenteritis 06/17/2021   Abdominal pain    Nausea    Calculus of gallbladder with acute cholecystitis without obstruction    Goals of care, counseling/discussion 04/03/2020   IDA (iron deficiency anemia) 02/22/2020   Colon cancer s/p right colectomy (HCC) 08/12/2019   Hepatitis    Severe sepsis (HCC)    SIRS (systemic inflammatory response syndrome) (HCC) 12/09/2016   Abnormal LFTs 12/09/2016   Acute viral syndrome 12/09/2016   Trigger finger of left thumb 02/03/2016   B12 deficiency 12/02/2015   S/P left THA, 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   Obese 02/16/2015   Atypical chest pain 06/11/2014   KNEE PAIN, LEFT 10/21/2008   Diabetes mellitus without complication (HCC) 06/04/2007   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   Primary thrombocytopenia (HCC) 06/04/2007   Essential hypertension 06/04/2007   PCP:  Jerl Mina, MD Pharmacy:   [facility] - [place], [place] - [address] [place] [address] Phone: [phone] Fax: [phone]  [facility] [address] [place] - [address] [place] [address] Phone: [phone] Fax: [phone]  [id number] - [place], [place] - [address] AT [facility] [address] [facility] [address] [place] [address] Phone: [phone] Fax: [phone]     Social Determinants of Health (SDOH) Social History: SDOH Screenings   Food Insecurity: Patient Unable To Answer (09/18/2023)  Housing: Patient Unable To Answer (09/18/2023)  Transportation Needs: Patient Unable To Answer (09/18/2023)  Utilities: Patient Unable To Answer (09/18/2023)  Alcohol Screen: Low Risk  (04/19/2023)  Depression (PHQ2-9): Low Risk  (04/19/2023)  Financial Resource Strain: Medium Risk (04/19/2023)  Physical Activity: Inactive (04/19/2023)  Social  Connections: Unknown (08/03/2023)   Received from [facility]  Stress: Stress Concern Present (04/19/2023)  Tobacco Use: Medium Risk (09/18/2023)   SDOH Interventions:     Readmission Risk Interventions    08/14/2023    1:14 PM 11/14/2022   12:51 PM  Readmission Risk Prevention Plan  Transportation Screening Complete Complete  PCP or Specialist Appt within 3-5 Days  Complete  HRI or Home Care Consult  Complete  Social Work Consult for Recovery Care Planning/Counseling  Complete  Palliative Care Screening  Not Applicable  Medication Review (RN Care Manager)  Referral to Pharmacy  PCP or Specialist appointment within 3-5 days of discharge Complete   HRI or Home Care Consult Complete   SW Recovery Care/Counseling Consult Complete   Palliative Care Screening Not Applicable   Skilled Nursing Facility Not Applicable

## 2023-09-19 NOTE — Plan of Care (Signed)
  Problem: Education: Goal: Knowledge of risk factors and measures for prevention of condition will improve Outcome: Progressing   Problem: Coping: Goal: Psychosocial and spiritual needs will be supported Outcome: Progressing   Problem: Respiratory: Goal: Will maintain a patent airway Outcome: Progressing Goal: Complications related to the disease process, condition or treatment will be avoided or minimized Outcome: Progressing   Problem: Fluid Volume: Goal: Hemodynamic stability will improve Outcome: Progressing

## 2023-09-19 NOTE — Progress Notes (Signed)
PHARMACY - PHYSICIAN COMMUNICATION CRITICAL VALUE ALERT - BLOOD CULTURE IDENTIFICATION (BCID)  Daniel Sherman is an 82 y.o. male who presented to [facility] on 09/18/2023 with a chief complaint of Confusion, UTI.   Assessment:   Blood cultures (4 bottles total from 2 sets):  2 bottles from the same set are growing GNR in both bottles. BCID:  Ecoli CTX-M positive indicates ESBL, and also Staphylococcus epidermidis that is not shown on the blood culture.    Name of physician (or Provider) Contacted: Dr. Allena Katz  Current antibiotics: Ceftriaxone   Changes to prescribed antibiotics recommended:  Recommendations accepted by provider Change ceftriaxone to Meropenem 1g IV q12h   Results for orders placed or performed during the hospital encounter of 09/18/23  Blood Culture ID Panel (Reflexed) (Collected: 09/18/2023  2:39 PM)  Result Value Ref Range   Enterococcus faecalis NOT DETECTED NOT DETECTED   Enterococcus Faecium NOT DETECTED NOT DETECTED   Listeria monocytogenes NOT DETECTED NOT DETECTED   Staphylococcus species DETECTED (A) NOT DETECTED   Staphylococcus aureus (BCID) NOT DETECTED NOT DETECTED   Staphylococcus epidermidis DETECTED (A) NOT DETECTED   Staphylococcus lugdunensis NOT DETECTED NOT DETECTED   Streptococcus species NOT DETECTED NOT DETECTED   Streptococcus agalactiae NOT DETECTED NOT DETECTED   Streptococcus pneumoniae NOT DETECTED NOT DETECTED   Streptococcus pyogenes NOT DETECTED NOT DETECTED   A.calcoaceticus-baumannii NOT DETECTED NOT DETECTED   Bacteroides fragilis NOT DETECTED NOT DETECTED   Enterobacterales DETECTED (A) NOT DETECTED   Enterobacter cloacae complex NOT DETECTED NOT DETECTED   Escherichia coli DETECTED (A) NOT DETECTED   Klebsiella aerogenes NOT DETECTED NOT DETECTED   Klebsiella oxytoca NOT DETECTED NOT DETECTED   Klebsiella pneumoniae NOT DETECTED NOT DETECTED   Proteus species NOT DETECTED NOT DETECTED   Salmonella species NOT DETECTED NOT  DETECTED   Serratia marcescens NOT DETECTED NOT DETECTED   Haemophilus influenzae NOT DETECTED NOT DETECTED   Neisseria meningitidis NOT DETECTED NOT DETECTED   Pseudomonas aeruginosa NOT DETECTED NOT DETECTED   Stenotrophomonas maltophilia NOT DETECTED NOT DETECTED   Candida albicans NOT DETECTED NOT DETECTED   Candida auris NOT DETECTED NOT DETECTED   Candida glabrata NOT DETECTED NOT DETECTED   Candida krusei NOT DETECTED NOT DETECTED   Candida parapsilosis NOT DETECTED NOT DETECTED   Candida tropicalis NOT DETECTED NOT DETECTED   Cryptococcus neoformans/gattii NOT DETECTED NOT DETECTED   CTX-M ESBL DETECTED (A) NOT DETECTED   Carbapenem resistance IMP NOT DETECTED NOT DETECTED   Carbapenem resistance KPC NOT DETECTED NOT DETECTED   Methicillin resistance mecA/C NOT DETECTED NOT DETECTED   Carbapenem resistance NDM NOT DETECTED NOT DETECTED   Carbapenem resist OXA 48 LIKE NOT DETECTED NOT DETECTED   Carbapenem resistance VIM NOT DETECTED NOT DETECTED    Lynann Beaver PharmD, BCPS [facility] main pharmacy [phone] 09/19/2023 4:38 PM

## 2023-09-19 NOTE — Plan of Care (Signed)
  Problem: Education: Goal: Knowledge of risk factors and measures for prevention of condition will improve Outcome: Progressing   Problem: Coping: Goal: Psychosocial and spiritual needs will be supported Outcome: Progressing   Problem: Respiratory: Goal: Will maintain a patent airway Outcome: Progressing Goal: Complications related to the disease process, condition or treatment will be avoided or minimized Outcome: Progressing   Problem: Fluid Volume: Goal: Hemodynamic stability will improve Outcome: Progressing   Problem: Clinical Measurements: Goal: Diagnostic test results will improve Outcome: Progressing Goal: Signs and symptoms of infection will decrease Outcome: Progressing   Problem: Respiratory: Goal: Ability to maintain adequate ventilation will improve Outcome: Progressing   Problem: Education: Goal: Knowledge of General Education information will improve Description: Including pain rating scale, medication(s)/side effects and non-pharmacologic comfort measures Outcome: Progressing   Problem: Health Behavior/Discharge Planning: Goal: Ability to manage health-related needs will improve Outcome: Progressing   Problem: Clinical Measurements: Goal: Ability to maintain clinical measurements within normal limits will improve Outcome: Progressing Goal: Will remain free from infection Outcome: Progressing Goal: Diagnostic test results will improve Outcome: Progressing Goal: Respiratory complications will improve Outcome: Progressing Goal: Cardiovascular complication will be avoided Outcome: Progressing   Problem: Activity: Goal: Risk for activity intolerance will decrease Outcome: Progressing   Problem: Nutrition: Goal: Adequate nutrition will be maintained Outcome: Progressing   Problem: Coping: Goal: Level of anxiety will decrease Outcome: Progressing   Problem: Elimination: Goal: Will not experience complications related to bowel motility Outcome:  Progressing Goal: Will not experience complications related to urinary retention Outcome: Progressing   Problem: Pain Managment: Goal: General experience of comfort will improve Outcome: Progressing   Problem: Safety: Goal: Ability to remain free from injury will improve Outcome: Progressing   Problem: Skin Integrity: Goal: Risk for impaired skin integrity will decrease Outcome: Progressing   Problem: Education: Goal: Understanding of post-operative needs will improve Outcome: Progressing Goal: Individualized Educational Video(s) Outcome: Progressing   Problem: Clinical Measurements: Goal: Postoperative complications will be avoided or minimized Outcome: Progressing   Problem: Respiratory: Goal: Will regain and/or maintain adequate ventilation Outcome: Progressing

## 2023-09-19 NOTE — Progress Notes (Addendum)
[facility] Progress Note Patient: Daniel Sherman MVH:[id number] DOB: 06-19-1941 DOA: 09/18/2023  DOS: the patient was seen and examined on 09/19/2023  Brief hospital course: PMH of advanced dementia, type II DM, HLD, HTN, GERD, IDA present to the hospital with complaints of worsening confusion. At his baseline patient has severely advanced dementia.  Most of the time he is not able to carry out any conversation. He is not able to answer questions appropriately. He will mostly give you a blank stare when you try to talk to him per his son. Currently being treated for gram-negative sepsis most likely from ESBL E. coli.  Assessment and Plan: Sepsis secondary to gram-negative bacteremia secondary to UTI. Patient is severely confused. Had mild hypotension with MAP 60.  Mild tachypnea as well. Did not have any leukocytosis but did have thrombocytopenia.  Also had acute kidney injury on top of chronic kidney disease stage IIIb. Condition progressively worsened with ongoing fever worsening agitation and lethargy as well as respiratory distress and hypotension and tachycardia requiring transfer to stepdown unit. Currently receiving IV antibiotics. Blood cultures came back positive for ESBL E. coli. Receiving IV meropenem now. Transfer to stepdown unit.  Continue with IV fluid.  Recheck lactic acid level.  Monitor closely. Check chest x-ray due to respiratory distress.  Hypoglycemia. Acute metabolic encephalopathy. Multifactorial. For now we will monitor. Concern for seizures cannot be ruled out. On dextrose fluid.  History of advanced dementia with severe agitation. Patient is on multiple antipsychotic medication as well as psychotropic medications. Due to his lethargy currently holding most of them. Unable to swallow safely. Will check speech therapy evaluation.  Diarrhea. On my arrival patient had large greenish liquidy BM accident. For now we will  monitor.  Thrombocytopenia. The setting of sepsis. Monitor.  Acute kidney injury on CKD stage IIIb. Baseline serum creatinine around 1.5. Worsened to 2.2. Receiving IV fluid. Will monitor.  Goals of care conversation. Patient's prognosis is very poor with given his advanced dementia. This is a second hospitalization and third ED visit in last 1 month. Currently gravely ill. At present family's goal is to focus on continuing current care with limitations of the DNR but if the patient's condition deteriorates further family would be open to transition to complete comfort. On discharge would recommend to transition to hospice.  Subjective: Nonverbal.  Unable to follow commands.  Lethargic.  Physical Exam: In severe distress. No rash. Intermittent tremor seen. Nonverbal. Intermittently following commands for the nursing staff. Bowel sound present. Clear to auscultation. Large greenish BM as well as incontinence of urine seen at the time of my evaluation. Injury to right eye as well as right lip seen which appears to be subacute.  Data Reviewed: I have Reviewed nursing notes, Vitals, and Lab results. Since last encounter, pertinent lab results CBC and BMP   . I have ordered test including CBC and CMP  .  Disposition: Status is: Inpatient Remains inpatient appropriate because: Needing IV antibiotic and further therapy  SCDs Start: 09/18/23 1853   Family Communication: Discussed with family on phone Level of care: Stepdown transfer due to hemodynamic decompensation Vitals:   09/19/23 1640 09/19/23 1837 09/19/23 1900 09/19/23 2000  BP:  (!) 105/46 105/86 (!) 99/55  Pulse:   92 86  Resp:  (!) 22 (!) 28 (!) 26  Temp: (!) 102.2 F (39 C) (!) 101.6 F (38.7 C)  100.3 F (37.9 C)  TempSrc:  Axillary  Oral  SpO2:  96% 100% 100%  Weight:  70.5 kg    Height:       The patient is critically ill with multiple organ systems failure and requires high complexity decision making  for assessment and support, frequent evaluation and titration of therapies. Critical Care Time devoted to patient care services described in this note is 35 minutes   Author: Lynden Oxford, MD 09/19/2023 8:36 PM  Please look on [email or web address] to find out who is on call.

## 2023-09-20 ENCOUNTER — Inpatient Hospital Stay (HOSPITAL_COMMUNITY)
Admit: 2023-09-20 | Discharge: 2023-09-20 | Disposition: A | Discharge status: Home / Self Care | Payer: Medicare HMO | Attending: Internal Medicine | Admitting: Internal Medicine

## 2023-09-20 DIAGNOSIS — R4182 Altered mental status, unspecified: Secondary | ICD-10-CM | POA: Diagnosis not present

## 2023-09-20 DIAGNOSIS — R569 Unspecified convulsions: Secondary | ICD-10-CM | POA: Diagnosis not present

## 2023-09-20 DIAGNOSIS — N3000 Acute cystitis without hematuria: Secondary | ICD-10-CM | POA: Diagnosis not present

## 2023-09-20 LAB — CBC WITH DIFFERENTIAL/PLATELET
Abs Immature Granulocytes: 0.02 10*3/uL (ref 0.00–0.07)
Basophils Absolute: 0 10*3/uL (ref 0.0–0.1)
Basophils Relative: 1 %
Eosinophils Absolute: 0 10*3/uL (ref 0.0–0.5)
Eosinophils Relative: 0 %
HCT: 33.1 % — ABNORMAL LOW (ref 39.0–52.0)
Hemoglobin: 10 g/dL — ABNORMAL LOW (ref 13.0–17.0)
Immature Granulocytes: 1 %
Lymphocytes Relative: 23 %
Lymphs Abs: 0.7 10*3/uL (ref 0.7–4.0)
MCH: 27.4 pg (ref 26.0–34.0)
MCHC: 30.2 g/dL (ref 30.0–36.0)
MCV: 90.7 fL (ref 80.0–100.0)
Monocytes Absolute: 0.4 10*3/uL (ref 0.1–1.0)
Monocytes Relative: 13 %
Neutro Abs: 2 10*3/uL (ref 1.7–7.7)
Neutrophils Relative %: 62 %
Platelets: 37 10*3/uL — ABNORMAL LOW (ref 150–400)
RBC: 3.65 MIL/uL — ABNORMAL LOW (ref 4.22–5.81)
RDW: 15.1 % (ref 11.5–15.5)
WBC: 3.2 10*3/uL — ABNORMAL LOW (ref 4.0–10.5)
nRBC: 0 % (ref 0.0–0.2)

## 2023-09-20 LAB — COMPREHENSIVE METABOLIC PANEL
ALT: 12 U/L (ref 0–44)
AST: 23 U/L (ref 15–41)
Albumin: 2.4 g/dL — ABNORMAL LOW (ref 3.5–5.0)
Alkaline Phosphatase: 43 U/L (ref 38–126)
Anion gap: 9 (ref 5–15)
BUN: 33 mg/dL — ABNORMAL HIGH (ref 8–23)
CO2: 23 mmol/L (ref 22–32)
Calcium: 7.8 mg/dL — ABNORMAL LOW (ref 8.9–10.3)
Chloride: 110 mmol/L (ref 98–111)
Creatinine, Ser: 1.77 mg/dL — ABNORMAL HIGH (ref 0.61–1.24)
GFR, Estimated: 38 mL/min — ABNORMAL LOW (ref >60)
Glucose, Bld: 111 mg/dL — ABNORMAL HIGH (ref 70–99)
Potassium: 4.3 mmol/L (ref 3.5–5.1)
Sodium: 142 mmol/L (ref 135–145)
Total Bilirubin: 1 mg/dL (ref 0.3–1.2)
Total Protein: 5.5 g/dL — ABNORMAL LOW (ref 6.5–8.1)

## 2023-09-20 LAB — PHOSPHORUS: Phosphorus: 2.6 mg/dL (ref 2.5–4.6)

## 2023-09-20 LAB — C DIFFICILE QUICK SCREEN W PCR REFLEX
C Diff antigen: NEGATIVE
C Diff interpretation: NOT DETECTED
C Diff toxin: NEGATIVE

## 2023-09-20 LAB — GLUCOSE, CAPILLARY
Glucose-Capillary: 100 mg/dL — ABNORMAL HIGH (ref 70–99)
Glucose-Capillary: 104 mg/dL — ABNORMAL HIGH (ref 70–99)
Glucose-Capillary: 107 mg/dL — ABNORMAL HIGH (ref 70–99)
Glucose-Capillary: 125 mg/dL — ABNORMAL HIGH (ref 70–99)
Glucose-Capillary: 88 mg/dL (ref 70–99)
Glucose-Capillary: 95 mg/dL (ref 70–99)

## 2023-09-20 LAB — URINE CULTURE: Culture: >=100000 — AB

## 2023-09-20 LAB — MAGNESIUM: Magnesium: 2 mg/dL (ref 1.7–2.4)
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
## 2023-09-20 NOTE — Evaluation (Signed)
SLP Cancellation Note  Patient Details Name: Daniel Sherman MRN: [id number] DOB: 1941-11-16   Cancelled treatment:       Reason Eval/Treat Not Completed: Other (comment);Fatigue/lethargy limiting ability to participate (pt currently sleeping and is agitated when awake, RN advised she will message this SLP when he awakens for eval)  Rolena Infante, MS [facility] SLP Acute Rehab Services Office [phone]   Chales Abrahams 09/20/2023, 8:14 AM

## 2023-09-20 NOTE — Progress Notes (Signed)
EEG complete - results pending 

## 2023-09-20 NOTE — Plan of Care (Signed)
[facility] Tonsillectomy/Adenoidectomy, Postoperative PEDS care plan entered in error.

## 2023-09-20 NOTE — Plan of Care (Signed)
  Problem: Respiratory: Goal: Complications related to the disease process, condition or treatment will be avoided or minimized Outcome: Progressing   Problem: Clinical Measurements: Goal: Signs and symptoms of infection will decrease Outcome: Progressing   Problem: Clinical Measurements: Goal: Respiratory complications will improve Outcome: Progressing   Problem: Education: Goal: Knowledge of risk factors and measures for prevention of condition will improve Outcome: Not Progressing   Problem: Education: Goal: Knowledge of General Education information will improve Description: Including pain rating scale, medication(s)/side effects and non-pharmacologic comfort measures Outcome: Not Progressing

## 2023-09-20 NOTE — Hospital Course (Addendum)
Brief hospital course: PMH of advanced dementia, type II DM, HLD, HTN, GERD, IDA present to the hospital with complaints of worsening confusion. At his baseline patient has severely advanced dementia.  Most of the time he is not able to carry out any conversation. He is not able to answer questions appropriately. He will mostly give you a blank stare when you try to talk to him per his son. For now plan is to continue with IV antibiotics and eventual transfer to [facility] with hospice support.   Assessment and Plan: Sepsis secondary to gram-negative bacteremia secondary to UTI. On presentation had hypotension, tachypnea and thrombocytopenia with acute kidney injury on chronic kidney disease.  UTI on admission. Urine and blood cultures came back positive for ESBL E. coli. Receiving IV meropenem. Mentation and hemodynamics are improving. Will continue IV antibiotics for total 7-day treatment.   Acute metabolic encephalopathy. Multifactorial, hypoglycemia, sepsis, psychotropic medications, delirium in the setting of dementia. Does not appear to have any focal deficit. EEG performed which is negative for any active seizures but does show evidence of triphasic waves concerning for toxic encephalopathy. Improving.  Will monitor.  Close to baseline per family.   History of advanced dementia with severe agitation. Patient is on multiple antipsychotic medication as well as psychotropic medications. Gradually resuming home medications.  Monitor.   Diarrhea. C. difficile negative.   Thrombocytopenia. Leukopenia In the setting of sepsis. Monitor.   Acute kidney injury on CKD stage IIIb. Baseline serum creatinine around 1.5. Worsened to 2.2.  Treated with IV fluid.  Now improving.  Will monitor for improvement without IV fluid.   Goals of care conversation. Patient's prognosis is very poor with given his advanced dementia. This is a second hospitalization and third ED visit in last 1  month. At present family's goal is to focus on continuing current care with limitations of the DNR but if the patient's condition deteriorates further family would be open to transition to complete comfort. 10/19.  Discussed with family at bedside.  Family's goal is to get the patient better for this admission and transition to hospice on discharge.  Will continue with antibiotics.  TOC consulted.  Family would like [place] inpatient hospice if the patient requires any inpatient hospice services down the road.  [facility] consulted.  Dysphagia. Speech therapy consulted. Advancing to D1 diet.

## 2023-09-20 NOTE — Procedures (Signed)
Patient Name: Daniel Sherman  MRN: [id number]  Epilepsy Attending: Charlsie Quest  Referring Physician/Provider: Rolly Salter, MD  Date: 09/20/2023 Duration: 26.09 mins  Patient history: 82yo M with ams getting eeg to evaluate for seizure  Level of alertness: Awake  AEDs during EEG study: VPA, Ativan  Technical aspects: This EEG study was done with scalp electrodes positioned according to the 10-20 International system of electrode placement. Electrical activity was reviewed with band pass filter of 1-70Hz , sensitivity of 7 uV/mm, display speed of 85mm/sec with a 60Hz  notched filter applied as appropriate. EEG data were recorded continuously and digitally stored.  Video monitoring was available and reviewed as appropriate.  Description: EEG showed continuous generalized 3 to 6 Hz theta-delta slowing. Generalized periodic discharges with triphasic morphology at 1 Hz were also noted. Hyperventilation and photic stimulation were not performed.     ABNORMALITY - Periodic discharges with triphasic morphology, generalized ( GPDs) - Continuous slow, generalized  IMPRESSION: This study is suggestive of moderate/severe diffuse encephalopathy likely related to toxic-metabolic causes. No seizures or definite epileptiform discharges were seen throughout the recording.  Jniyah Dantuono Annabelle Harman

## 2023-09-21 DIAGNOSIS — N3000 Acute cystitis without hematuria: Secondary | ICD-10-CM | POA: Diagnosis not present

## 2023-09-21 LAB — BASIC METABOLIC PANEL
Anion gap: 6 (ref 5–15)
BUN: 29 mg/dL — ABNORMAL HIGH (ref 8–23)
CO2: 25 mmol/L (ref 22–32)
Calcium: 7.7 mg/dL — ABNORMAL LOW (ref 8.9–10.3)
Chloride: 111 mmol/L (ref 98–111)
Creatinine, Ser: 1.62 mg/dL — ABNORMAL HIGH (ref 0.61–1.24)
GFR, Estimated: 42 mL/min — ABNORMAL LOW (ref >60)
Glucose, Bld: 128 mg/dL — ABNORMAL HIGH (ref 70–99)
Potassium: 3.6 mmol/L (ref 3.5–5.1)
Sodium: 142 mmol/L (ref 135–145)

## 2023-09-21 LAB — CBC
HCT: 27.7 % — ABNORMAL LOW (ref 39.0–52.0)
Hemoglobin: 8.4 g/dL — ABNORMAL LOW (ref 13.0–17.0)
MCH: 27.3 pg (ref 26.0–34.0)
MCHC: 30.3 g/dL (ref 30.0–36.0)
MCV: 89.9 fL (ref 80.0–100.0)
Platelets: 45 10*3/uL — ABNORMAL LOW (ref 150–400)
RBC: 3.08 MIL/uL — ABNORMAL LOW (ref 4.22–5.81)
RDW: 14.7 % (ref 11.5–15.5)
WBC: 1.3 10*3/uL — CL (ref 4.0–10.5)
nRBC: 0 % (ref 0.0–0.2)

## 2023-09-21 LAB — MAGNESIUM: Magnesium: 1.7 mg/dL (ref 1.7–2.4)

## 2023-09-21 LAB — GLUCOSE, CAPILLARY
Glucose-Capillary: 109 mg/dL — ABNORMAL HIGH (ref 70–99)
Glucose-Capillary: 121 mg/dL — ABNORMAL HIGH (ref 70–99)
Glucose-Capillary: 127 mg/dL — ABNORMAL HIGH (ref 70–99)
Glucose-Capillary: 136 mg/dL — ABNORMAL HIGH (ref 70–99)
Glucose-Capillary: 149 mg/dL — ABNORMAL HIGH (ref 70–99)

## 2023-09-21 MED ORDER — LORAZEPAM 2 MG/ML IJ SOLN: 0.5000 mg | Freq: Four times a day (QID) | INTRAMUSCULAR | Status: DC | PRN

## 2023-09-21 MED ORDER — OLANZAPINE 5 MG PO TBDP
5.0000 mg | ORAL_TABLET | Freq: Every day | ORAL | Status: DC
  Administered 2023-09-21 – 2023-09-25 (×5): 5 mg via ORAL
  Filled 2023-09-21 (×6): qty 1

## 2023-09-21 MED ORDER — VALPROIC ACID 250 MG/5ML PO SOLN
250.0000 mg | Freq: Three times a day (TID) | ORAL | Status: DC
  Administered 2023-09-21 – 2023-09-22 (×3): 250 mg via ORAL
  Filled 2023-09-21 (×4): qty 5

## 2023-09-21 MED ORDER — ESCITALOPRAM OXALATE 10 MG PO TABS
10.0000 mg | ORAL_TABLET | Freq: Every day | ORAL | Status: DC
  Administered 2023-09-21 – 2023-09-26 (×6): 10 mg via ORAL
  Filled 2023-09-21 (×6): qty 1

## 2023-09-21 MED ORDER — DONEPEZIL HCL 10 MG PO TABS
10.0000 mg | ORAL_TABLET | Freq: Every day | ORAL | Status: DC
  Administered 2023-09-21: 10 mg via ORAL
  Filled 2023-09-21: qty 1

## 2023-09-21 NOTE — Progress Notes (Signed)
[facility] Progress Note Patient: Daniel Sherman YQM:[id number] DOB: [birth year] DOA: 09/18/2023  DOS: the patient was seen and examined on 09/21/2023  Brief hospital course: PMH of advanced dementia, type II DM, HLD, HTN, GERD, IDA present to the hospital with complaints of worsening confusion. At his baseline patient has severely advanced dementia.  Most of the time he is not able to carry out any conversation. He is not able to answer questions appropriately. He will mostly give you a blank stare when you try to talk to him per his son. Currently being treated for gram-negative sepsis most likely from ESBL E. coli.  Assessment and Plan: Sepsis secondary to gram-negative bacteremia secondary to UTI. On presentation had hypotension, tachypnea and thrombocytopenia with acute kidney injury on chronic kidney disease.  Evidence of infection with UTI on admission. Condition progressively worsened with ongoing fever worsening agitation and lethargy as well as respiratory distress and hypotension and tachycardia Urine and blood cultures came back positive for ESBL E. coli. Receiving IV meropenem.  Mentation currently unchanged.  Hemodynamics are improving.  Will discuss with ID regarding duration of the antibiotic.  Most likely require at least 7 days of IV antibiotics.   Hypoglycemia. Acute metabolic encephalopathy. Multifactorial, hypoglycemia, sepsis, psychotropic medications, delirium in the setting of dementia. Does not appear to have any focal deficit. EEG performed which is negative for any active seizures but does show evidence of triphasic waves concerning for toxic encephalopathy. Improving.  Will monitor.  Close to baseline per family.   History of advanced dementia with severe agitation. Patient is on multiple antipsychotic medication as well as psychotropic medications. Gradually resuming home medications.  Monitor.   Diarrhea. C. difficile negative.    Thrombocytopenia. Leukopenia In the setting of sepsis. Monitor.   Acute kidney injury on CKD stage IIIb. Baseline serum creatinine around 1.5. Worsened to 2.2.  Treated with IV fluid.  Now improving.  Will monitor for improvement without IV fluid.   Goals of care conversation. Patient's prognosis is very poor with given his advanced dementia. This is a second hospitalization and third ED visit in last 1 month. At present family's goal is to focus on continuing current care with limitations of the DNR but if the patient's condition deteriorates further family would be open to transition to complete comfort. 10/19.  Discussed with family at bedside.  Family's goal is to get the patient better for this admission and transition to hospice on discharge.  Will continue with antibiotics.  TOC consulted.  Family would like  inpatient hospice if the patient requires any inpatient hospice services down the road.  [facility] consulted.  Dysphagia. Speech therapy consulted. Currently on thin liquid diet.   Subjective: No nausea no vomiting.  Mentation improving.  No further agitation.  Trying to remove his mittens.  Physical Exam: General: in Mild distress, No Rash, oral mucosa dry Cardiovascular: S1 and S2 Present, No Murmur Respiratory: Good respiratory effort, Bilateral Air entry present. No Crackles, No wheezes Abdomen: Bowel Sound present, No tenderness Extremities: No edema Neuro: Alert and oriented self no new focal deficit  Data Reviewed: I have Reviewed nursing notes, Vitals, and Lab results. Since last encounter, pertinent lab results CBC and BMP   . I have ordered test including CBC and BMP  .   Disposition: Status is: Inpatient Remains inpatient appropriate because: Requiring IV antibiotics  SCDs Start: 09/18/23 1853   Family Communication: Family at bedside Level of care: Progressive continue to sepsis Vitals:   09/20/23 2200  09/21/23 0457 09/21/23 1219  09/21/23 1424  BP: (!) 163/126 120/73 (!) 102/57   Pulse: 88 95 80   Resp: 13 16 16    Temp:  98.7 F (37.1 C) 98.4 F (36.9 C)   TempSrc:  Axillary Oral   SpO2: 90% 97% 99% 96%  Weight:      Height:         Author: Lynden Oxford, MD 09/21/2023 6:46 PM  Please look on [email or web address] to find out who is on call.

## 2023-09-21 NOTE — Plan of Care (Signed)
  Problem: Pain Managment: Goal: General experience of comfort will improve Outcome: Progressing   Problem: Safety: Goal: Ability to remain free from injury will improve Outcome: Progressing   Problem: Skin Integrity: Goal: Risk for impaired skin integrity will decrease Outcome: Progressing   

## 2023-09-21 NOTE — Progress Notes (Signed)
MD notified of critical WBC 1.3.

## 2023-09-21 NOTE — Progress Notes (Signed)
[facility] Liaison Note  Received request from [place], Transitions of Care Manager, for hospice services at longterm care facility after discharge. Spoke with [name] to initiate education related to hospice philosophy, services, and team approach to care. Patient/family verbalized understanding of information given.  DME needs discussed. Patient has no current DME needs.   Please send signed and completed DNR home with patient/family. Please provide prescriptions at discharge as needed to ensure ongoing symptom management.  [facility] information and contact numbers given to [place]. Above information shared with Darlin Priestly, Transitions of Care Manager. Please call with any questions or concerns.   Thank you for the opportunity to participate in this patient's care.   Glenna Fellows BSN, Charity fundraiser, OCN [employer] [phone]

## 2023-09-22 DIAGNOSIS — N3000 Acute cystitis without hematuria: Secondary | ICD-10-CM | POA: Diagnosis not present

## 2023-09-22 LAB — CULTURE, BLOOD (ROUTINE X 2): Special Requests: ADEQUATE

## 2023-09-22 LAB — CBC WITH DIFFERENTIAL/PLATELET
Abs Immature Granulocytes: 0.01 10*3/uL (ref 0.00–0.07)
Basophils Absolute: 0 10*3/uL (ref 0.0–0.1)
Basophils Relative: 1 %
Eosinophils Absolute: 0.1 10*3/uL (ref 0.0–0.5)
Eosinophils Relative: 7 %
HCT: 31.3 % — ABNORMAL LOW (ref 39.0–52.0)
Hemoglobin: 9.2 g/dL — ABNORMAL LOW (ref 13.0–17.0)
Immature Granulocytes: 1 %
Lymphocytes Relative: 25 %
Lymphs Abs: 0.4 10*3/uL — ABNORMAL LOW (ref 0.7–4.0)
MCH: 27.1 pg (ref 26.0–34.0)
MCHC: 29.4 g/dL — ABNORMAL LOW (ref 30.0–36.0)
MCV: 92.3 fL (ref 80.0–100.0)
Monocytes Absolute: 0.2 10*3/uL (ref 0.1–1.0)
Monocytes Relative: 11 %
Neutro Abs: 0.9 10*3/uL — ABNORMAL LOW (ref 1.7–7.7)
Neutrophils Relative %: 55 %
Platelets: 52 10*3/uL — ABNORMAL LOW (ref 150–400)
RBC: 3.39 MIL/uL — ABNORMAL LOW (ref 4.22–5.81)
RDW: 14.4 % (ref 11.5–15.5)
WBC: 1.5 10*3/uL — ABNORMAL LOW (ref 4.0–10.5)
nRBC: 0 % (ref 0.0–0.2)

## 2023-09-22 LAB — COMPREHENSIVE METABOLIC PANEL
ALT: 22 U/L (ref 0–44)
AST: 31 U/L (ref 15–41)
Albumin: 2.4 g/dL — ABNORMAL LOW (ref 3.5–5.0)
Alkaline Phosphatase: 44 U/L (ref 38–126)
Anion gap: 6 (ref 5–15)
BUN: 26 mg/dL — ABNORMAL HIGH (ref 8–23)
CO2: 27 mmol/L (ref 22–32)
Calcium: 8.4 mg/dL — ABNORMAL LOW (ref 8.9–10.3)
Chloride: 112 mmol/L — ABNORMAL HIGH (ref 98–111)
Creatinine, Ser: 1.42 mg/dL — ABNORMAL HIGH (ref 0.61–1.24)
GFR, Estimated: 49 mL/min — ABNORMAL LOW (ref >60)
Glucose, Bld: 110 mg/dL — ABNORMAL HIGH (ref 70–99)
Potassium: 3.6 mmol/L (ref 3.5–5.1)
Sodium: 145 mmol/L (ref 135–145)
Total Bilirubin: 0.8 mg/dL (ref 0.3–1.2)
Total Protein: 5.4 g/dL — ABNORMAL LOW (ref 6.5–8.1)

## 2023-09-22 LAB — MAGNESIUM: Magnesium: 1.8 mg/dL (ref 1.7–2.4)

## 2023-09-22 LAB — GLUCOSE, CAPILLARY: Glucose-Capillary: 141 mg/dL — ABNORMAL HIGH (ref 70–99)

## 2023-09-22 MED ORDER — DONEPEZIL HCL 10 MG PO TABS: 10.0000 mg | ORAL_TABLET | Freq: Every day | ORAL | Status: DC

## 2023-09-22 MED ORDER — DIVALPROEX SODIUM 250 MG PO DR TAB
250.0000 mg | DELAYED_RELEASE_TABLET | Freq: Three times a day (TID) | ORAL | Status: DC
  Filled 2023-09-22 (×2): qty 1

## 2023-09-22 MED ORDER — DONEPEZIL HCL 10 MG PO TABS
10.0000 mg | ORAL_TABLET | Freq: Two times a day (BID) | ORAL | Status: DC
  Administered 2023-09-22 – 2023-09-26 (×9): 10 mg via ORAL
  Filled 2023-09-22 (×9): qty 1

## 2023-09-22 MED ORDER — SODIUM CHLORIDE 0.9 % IV SOLN
1.0000 g | Freq: Two times a day (BID) | INTRAVENOUS | Status: AC
  Administered 2023-09-22 – 2023-09-26 (×8): 1 g via INTRAVENOUS
  Filled 2023-09-22 (×10): qty 20

## 2023-09-22 MED ORDER — VALPROIC ACID 250 MG/5ML PO SOLN
250.0000 mg | Freq: Three times a day (TID) | ORAL | Status: DC
  Administered 2023-09-22 – 2023-09-26 (×12): 250 mg via ORAL
  Filled 2023-09-22 (×14): qty 5

## 2023-09-22 MED ORDER — GABAPENTIN 300 MG PO CAPS
300.0000 mg | ORAL_CAPSULE | Freq: Every day | ORAL | Status: DC
  Administered 2023-09-22 – 2023-09-26 (×5): 300 mg via ORAL
  Filled 2023-09-22 (×5): qty 1

## 2023-09-22 MED ORDER — LORAZEPAM 0.5 MG PO TABS
0.5000 mg | ORAL_TABLET | ORAL | Status: DC | PRN
  Administered 2023-09-22 – 2023-09-25 (×8): 0.5 mg via ORAL
  Filled 2023-09-22 (×8): qty 1

## 2023-09-22 MED ORDER — LORAZEPAM 0.5 MG PO TABS: 0.5000 mg | ORAL_TABLET | Freq: Every day | ORAL | Status: DC

## 2023-09-22 NOTE — Plan of Care (Signed)
  Problem: Skin Integrity: Goal: Risk for impaired skin integrity will decrease Outcome: Progressing   

## 2023-09-22 NOTE — Progress Notes (Signed)
Pharmacy Antibiotic Note  Daniel Sherman is a 82 y.o. male admitted on 09/18/2023 with  ESBL Ecoli bacteremia .  Pharmacy has been consulted for meropenem dosing on 10/17  Day 3 meropenem WBC 1.5 SCr 1.42, CrCl 35  Afebrile   Plan: Continue meropenem 1g IV q12 per current renal function  Height: 5\' 5"  (165.1 cm) Weight: 70.5 kg (155 lb 6.8 oz) IBW/kg (Calculated) : 61.5  Temp (24hrs), Avg:98.3 F (36.8 C), Min:98 F (36.7 C), Max:98.6 F (37 C)  Recent Labs  Lab 09/18/23 1504 09/18/23 1904 09/19/23 0420 09/19/23 1645 09/19/23 1913 09/19/23 2152 09/20/23 0923 09/20/23 1056 09/21/23 0838 09/22/23 0525  WBC  --   --  5.7 3.4*  --   --   --  3.2* 1.3* 1.5*  CREATININE  --   --  2.21* 2.16*  --   --  1.77*  --  1.62* 1.42*  LATICACIDVEN 1.4 1.2  --   --  1.5 0.8  --   --   --   --     Estimated Creatinine Clearance: 34.9 mL/min (A) (by C-G formula based on SCr of 1.42 mg/dL (H)).    Allergies  Allergen Reactions   Other     BLOOD PRODUCT REFUSAL     Thank you for allowing pharmacy to be a part of this patient's care.  Berkley Harvey 09/22/2023 12:13 PM

## 2023-09-22 NOTE — Progress Notes (Signed)
[facility] Progress Note Patient: Daniel Sherman KGU:[id number] DOB: 1941-02-23 DOA: 09/18/2023  DOS: the patient was seen and examined on 09/22/2023  Brief hospital course: PMH of advanced dementia, type II DM, HLD, HTN, GERD, IDA present to the hospital with complaints of worsening confusion. At his baseline patient has severely advanced dementia.  Most of the time he is not able to carry out any conversation. He is not able to answer questions appropriately. He will mostly give you a blank stare when you try to talk to him per his son. Currently being treated for gram-negative sepsis most likely from ESBL E. coli. Assessment and Plan: Sepsis secondary to gram-negative bacteremia secondary to UTI. On presentation had hypotension, tachypnea and thrombocytopenia with acute kidney injury on chronic kidney disease. Evidence of infection with UTI on admission. Condition progressively worsened with ongoing fever worsening agitation and lethargy as well as respiratory distress and hypotension and tachycardia. Urine and blood cultures came back positive for ESBL E. coli. Receiving IV meropenem. Mentation currently unchanged.  Hemodynamics are improving.  Will discuss with ID regarding duration of the antibiotic.  Most likely require at least 7 days of IV antibiotics.   Hypoglycemia. Acute metabolic encephalopathy. Multifactorial, hypoglycemia, sepsis, psychotropic medications, delirium in the setting of dementia. Does not appear to have any focal deficit. EEG performed which is negative for any active seizures but does show evidence of triphasic waves concerning for toxic encephalopathy. Improving.  Will monitor.  Close to baseline per family.   History of advanced dementia with severe agitation. Patient is on multiple antipsychotic medication as well as psychotropic medications. Gradually resuming home medications.  Monitor.   Diarrhea. C. difficile negative.    Thrombocytopenia. Leukopenia In the setting of sepsis. Monitor.   Acute kidney injury on CKD stage IIIb. Baseline serum creatinine around 1.5. Worsened to 2.2.  Treated with IV fluid.  Now improving.  Will monitor for improvement without IV fluid.   Goals of care conversation. Patient's prognosis is very poor with given his advanced dementia. This is a second hospitalization and third ED visit in last 1 month. At present family's goal is to focus on continuing current care with limitations of the DNR but if the patient's condition deteriorates further family would be open to transition to complete comfort. 10/19.  Discussed with family at bedside.  Family's goal is to get the patient better for this admission and transition to hospice on discharge.  Will continue with antibiotics.  TOC consulted.  Family would like [place] inpatient hospice if the patient requires any inpatient hospice services down the road.  [facility] consulted.  Dysphagia. Speech therapy consulted. Advancing to D3 diet.  Subjective: No nausea no vomiting no fever no chills.  Somewhat agitated.  Physical Exam: Alert and awake.  Not oriented. Clear to auscultation. Bowel sound present. No edema.  No tremor seen.  Data Reviewed: I have Reviewed nursing notes, Vitals, and Lab results. Since last encounter, pertinent lab results CBC and CMP   . I have ordered test including CBC and CMP  .   Disposition: Status is: Inpatient Remains inpatient appropriate because: Requiring IV antibiotics  SCDs Start: 09/18/23 1853   Family Communication: No one at bedside.  Discussed with family on 10/19. Level of care: Med-Surg   Vitals:   09/21/23 1219 09/21/23 1424 09/21/23 2303 09/22/23 0547  BP: (!) 102/57  (!) 107/45 (!) 103/55  Pulse: 80  72 63  Resp: 16  20 18   Temp: 98.4 F (36.9 C)  98.6 F (37 C) 98 F (36.7 C)  TempSrc: Oral  Oral Oral  SpO2: 99% 96% 100% 96%  Weight:      Height:          Author: Lynden Oxford, MD 09/22/2023 4:58 PM  Please look on [email or web address] to find out who is on call.

## 2023-09-23 DIAGNOSIS — N3000 Acute cystitis without hematuria: Secondary | ICD-10-CM | POA: Diagnosis not present

## 2023-09-23 LAB — COMPREHENSIVE METABOLIC PANEL
ALT: 24 U/L (ref 0–44)
AST: 27 U/L (ref 15–41)
Albumin: 2.2 g/dL — ABNORMAL LOW (ref 3.5–5.0)
Alkaline Phosphatase: 44 U/L (ref 38–126)
Anion gap: 8 (ref 5–15)
BUN: 26 mg/dL — ABNORMAL HIGH (ref 8–23)
CO2: 25 mmol/L (ref 22–32)
Calcium: 8 mg/dL — ABNORMAL LOW (ref 8.9–10.3)
Chloride: 108 mmol/L (ref 98–111)
Creatinine, Ser: 1.39 mg/dL — ABNORMAL HIGH (ref 0.61–1.24)
GFR, Estimated: 51 mL/min — ABNORMAL LOW (ref >60)
Glucose, Bld: 109 mg/dL — ABNORMAL HIGH (ref 70–99)
Potassium: 3.9 mmol/L (ref 3.5–5.1)
Sodium: 141 mmol/L (ref 135–145)
Total Bilirubin: 0.5 mg/dL (ref 0.3–1.2)
Total Protein: 4.8 g/dL — ABNORMAL LOW (ref 6.5–8.1)

## 2023-09-23 LAB — CBC WITH DIFFERENTIAL/PLATELET
Abs Immature Granulocytes: 0.02 10*3/uL (ref 0.00–0.07)
Basophils Absolute: 0 10*3/uL (ref 0.0–0.1)
Basophils Relative: 1 %
Eosinophils Absolute: 0.1 10*3/uL (ref 0.0–0.5)
Eosinophils Relative: 7 %
HCT: 27.3 % — ABNORMAL LOW (ref 39.0–52.0)
Hemoglobin: 8.6 g/dL — ABNORMAL LOW (ref 13.0–17.0)
Immature Granulocytes: 1 %
Lymphocytes Relative: 34 %
Lymphs Abs: 0.7 10*3/uL (ref 0.7–4.0)
MCH: 27.7 pg (ref 26.0–34.0)
MCHC: 31.5 g/dL (ref 30.0–36.0)
MCV: 87.8 fL (ref 80.0–100.0)
Monocytes Absolute: 0.2 10*3/uL (ref 0.1–1.0)
Monocytes Relative: 10 %
Neutro Abs: 0.9 10*3/uL — ABNORMAL LOW (ref 1.7–7.7)
Neutrophils Relative %: 47 %
Platelets: 67 10*3/uL — ABNORMAL LOW (ref 150–400)
RBC: 3.11 MIL/uL — ABNORMAL LOW (ref 4.22–5.81)
RDW: 14.3 % (ref 11.5–15.5)
WBC: 2 10*3/uL — ABNORMAL LOW (ref 4.0–10.5)
nRBC: 0 % (ref 0.0–0.2)

## 2023-09-23 LAB — MAGNESIUM: Magnesium: 1.8 mg/dL (ref 1.7–2.4)

## 2023-09-23 NOTE — Progress Notes (Signed)
[facility] Progress Note Patient: Daniel Sherman QQV:[id number] DOB: Jan 13, 1941 DOA: 09/18/2023  DOS: the patient was seen and examined on 09/23/2023  Brief hospital course: PMH of advanced dementia, type II DM, HLD, HTN, GERD, IDA present to the hospital with complaints of worsening confusion. At his baseline patient has severely advanced dementia.  Most of the time he is not able to carry out any conversation. He is not able to answer questions appropriately. He will mostly give you a blank stare when you try to talk to him per his son. For now plan is to continue with IV antibiotics and eventual transfer to [facility] with hospice support.   Assessment and Plan: Sepsis secondary to gram-negative bacteremia secondary to UTI. On presentation had hypotension, tachypnea and thrombocytopenia with acute kidney injury on chronic kidney disease.  UTI on admission. Urine and blood cultures came back positive for ESBL E. coli. Receiving IV meropenem. Mentation and hemodynamics are improving. Will continue IV antibiotics for total 7-day treatment.   Acute metabolic encephalopathy. Multifactorial, hypoglycemia, sepsis, psychotropic medications, delirium in the setting of dementia. Does not appear to have any focal deficit. EEG performed which is negative for any active seizures but does show evidence of triphasic waves concerning for toxic encephalopathy. Improving.  Will monitor.  Close to baseline per family.   History of advanced dementia with severe agitation. Patient is on multiple antipsychotic medication as well as psychotropic medications. Gradually resuming home medications.  Monitor.   Diarrhea. C. difficile negative.   Thrombocytopenia. Leukopenia In the setting of sepsis. Monitor.   Acute kidney injury on CKD stage IIIb. Baseline serum creatinine around 1.5. Worsened to 2.2.  Treated with IV fluid.  Now improving.  Will monitor for improvement without IV fluid.    Goals of care conversation. Patient's prognosis is very poor with given his advanced dementia. This is a second hospitalization and third ED visit in last 1 month. At present family's goal is to focus on continuing current care with limitations of the DNR but if the patient's condition deteriorates further family would be open to transition to complete comfort. 10/19.  Discussed with family at bedside.  Family's goal is to get the patient better for this admission and transition to hospice on discharge.  Will continue with antibiotics.  TOC consulted.  Family would like [place] inpatient hospice if the patient requires any inpatient hospice services down the road.  [facility] consulted.  Dysphagia. Speech therapy consulted. Advancing to D1 diet.   Subjective: No acute complaint.  No nausea or vomiting no fever no chills.  Physical Exam: Clear to auscultation. S1-S2 present Bowel sound present.  General alert but not oriented.  Data Reviewed: I have Reviewed nursing notes, Vitals, and Lab results. Since last encounter, pertinent lab results CBC   . I have ordered test including BMP  .   Disposition: Status is: Inpatient Remains inpatient appropriate because: Need for further workup  SCDs Start: 09/18/23 1853   Family Communication: No one at bedside Level of care: Med-Surg   Vitals:   09/22/23 0547 09/22/23 2010 09/23/23 0528 09/23/23 1252  BP: (!) 103/55 117/62 (!) 104/56 94/61  Pulse: 63 62 66 62  Resp: 18 18 16 14   Temp: 98 F (36.7 C) 97.8 F (36.6 C) 98.6 F (37 C) 98.1 F (36.7 C)  TempSrc: Oral Oral Oral Oral  SpO2: 96% 100% 95% 97%  Weight:      Height:         Author: Edsel Petrin  Allena Katz, MD 09/23/2023 5:54 PM  Please look on [email or web address] to find out who is on call.

## 2023-09-23 NOTE — Progress Notes (Signed)
Daniel Sherman [address] [employer]   Patient is referred for hospice in facility upon discharge back to [facility].   Hospital liaison continues to follow and will initiate services once discharge is determined.   Thank you for the opportunity to participate in this patient's care.  Thea Gist, BSN RN Hospice hospital liaison [phone]

## 2023-09-23 NOTE — Evaluation (Signed)
Physical Therapy Evaluation Patient Details Name: Daniel Sherman MRN: [id number] DOB: 1941/03/18 Today's Date: 09/23/2023  History of Present Illness  Daniel Sherman is a 82 y.o. male admitted with UTI. PMH: advanced dementia, type II DM, HLD, HTN, GERD, iron deficiency anemia, MI  Clinical Impression  Pt admitted with above diagnosis. Pt unable to provide PLOF or home set up, per chart review pt from [facility] place and family seeking inpatient hospice at d/c. Pt in recliner upon arrival, able to stand and amb in room with RW with CGA-min A, significantly unsafe needing attention to task cues and assist with RW management as pt easily distracted reaching for objects in room and abandoning RW. Pt initially agitated with hospital gown and blanket, easily redirected and pleasant, follows commands inconsistently and needing increased time and cues. Rec inpatient hospice at d/c per chart review; will update d/c rec and DME rec as able with additional information from family. Pt currently with functional limitations due to the deficits listed below (see PT Problem List). Pt will benefit from acute skilled PT to increase their independence and safety with mobility to allow discharge.           If plan is discharge home, recommend the following: A little help with walking and/or transfers;A little help with bathing/dressing/bathroom;Assistance with cooking/housework;Direct supervision/assist for medications management;Direct supervision/assist for financial management;Assist for transportation   Can travel by private vehicle        Equipment Recommendations Other (comment) (TBD)  Recommendations for Other Services       Functional Status Assessment Patient has had a recent decline in their functional status and demonstrates the ability to make significant improvements in function in a reasonable and predictable amount of time.     Precautions / Restrictions Precautions Precautions:  Fall Restrictions Weight Bearing Restrictions: No      Mobility  Bed Mobility               General bed mobility comments: in recliner upon arrival    Transfers Overall transfer level: Needs assistance Equipment used: Rolling walker (2 wheels), None Transfers: Sit to/from Stand Sit to Stand: Contact guard assist           General transfer comment: pt initially pushes RW away and powers to stand in front of recliner, pt pulling at hospital gown and returns to sitting to reposition gown; pt powers to stand 2nd time with RW, maintains trunk forward flexed, CGA for safety    Ambulation/Gait Ambulation/Gait assistance: Contact guard assist, Min assist Gait Distance (Feet): 10 Feet Assistive device: Rolling walker (2 wheels) Gait Pattern/deviations: Step-through pattern, Shuffle, Trunk flexed Gait velocity: decreased     General Gait Details: pt taking slow, short, shuffling steps with trunk flexed, assist to manage RW appropriate and maintain at safe distance, no overt LOB, but generally unsafe as pt is distracted by objects in room stepping away from RW or not managing appropriately  Stairs            Wheelchair Mobility     Tilt Bed    Modified Rankin (Stroke Patients Only)       Balance Overall balance assessment: Mild deficits observed, not formally tested                                           Pertinent Vitals/Pain Pain Assessment Pain Assessment: Faces Faces Pain Scale:  No hurt    Home Living Family/patient expects to be discharged to:: Hospice/Palliative care                   Additional Comments: per chart review pt from [facility], family requesting d/c to inpatient hospice, no family present    Prior Function                       Extremity/Trunk Assessment   Upper Extremity Assessment Upper Extremity Assessment: Overall WFL for tasks assessed    Lower Extremity Assessment Lower Extremity  Assessment: Generalized weakness    Cervical / Trunk Assessment Cervical / Trunk Assessment: Kyphotic  Communication      Cognition Arousal: Alert Behavior During Therapy: Agitated, Restless Overall Cognitive Status: History of cognitive impairments - at baseline                                 General Comments: pt yelling "dammit" repetitively upon entry into room while pulling at gown and blanket, quickly reoriented and calmed, pt reaching for menu on table and objects on counter needing constant redirection, max multimodal cues with increased time to complete tasks        General Comments      Exercises     Assessment/Plan    PT Assessment Patient needs continued PT services  PT Problem List Decreased strength;Decreased activity tolerance;Decreased balance;Decreased mobility;Decreased cognition;Decreased knowledge of use of DME;Decreased safety awareness       PT Treatment Interventions DME instruction;Gait training;Functional mobility training;Therapeutic activities;Therapeutic exercise;Balance training;Neuromuscular re-education;Cognitive remediation;Patient/family education    PT Goals (Current goals can be found in the Care Plan section)  Acute Rehab PT Goals Patient Stated Goal: none stated PT Goal Formulation: With patient Time For Goal Achievement: 10/07/23 Potential to Achieve Goals: Fair    Frequency Min 1X/week     Co-evaluation               AM-PAC PT "6 Clicks" Mobility  Outcome Measure Help needed turning from your back to your side while in a flat bed without using bedrails?: A Little Help needed moving from lying on your back to sitting on the side of a flat bed without using bedrails?: A Little Help needed moving to and from a bed to a chair (including a wheelchair)?: A Little Help needed standing up from a chair using your arms (e.g., wheelchair or bedside chair)?: A Little Help needed to walk in hospital room?: A Little Help  needed climbing 3-5 steps with a railing? : A Lot 6 Click Score: 17    End of Session Equipment Utilized During Treatment: Gait belt Activity Tolerance: Patient tolerated treatment well Patient left: in chair;with call bell/phone within reach;with chair alarm set Nurse Communication: Mobility status PT Visit Diagnosis: Unsteadiness on feet (R26.81);Other abnormalities of gait and mobility (R26.89);Muscle weakness (generalized) (M62.81)    Time: 7829-5621 PT Time Calculation (min) (ACUTE ONLY): 13 min   Charges:   PT Evaluation $PT Eval Low Complexity: 1 Low   PT General Charges $$ ACUTE PT VISIT: 1 Visit         Tori Ferrah Panagopoulos PT, DPT 09/23/23, 11:14 AM

## 2023-09-23 NOTE — TOC Progression Note (Signed)
Transition of Care [facility]) - Progression Note    Patient Details  Name: Daniel Sherman MRN: [id number] Date of Birth: [birth year]  Transition of Care [facility]) CM/SW Contact  Tige Meas, Olegario Messier, RN Phone Number: 09/23/2023, 11:45 AM  Clinical Narrative: Per notes family seeking inpt hospice-await return call from Shane(Son). Left vm w/[facility] await call back to confirm level of care [phone].      Expected Discharge Plan:  (TBD) Barriers to Discharge: Continued Medical Work up  Expected Discharge Plan and Services In-house Referral: NA Discharge Planning Services: NA   Living arrangements for the past 2 months: Skilled Nursing Facility [facility])                   DME Agency: NA       HH Arranged:  (NA)           Social Determinants of Health (SDOH) Interventions SDOH Screenings   Food Insecurity: Patient Unable To Answer (09/18/2023)  Housing: Patient Unable To Answer (09/18/2023)  Transportation Needs: Patient Unable To Answer (09/18/2023)  Utilities: Patient Unable To Answer (09/18/2023)  Alcohol Screen: Low Risk  (04/19/2023)  Depression (PHQ2-9): Low Risk  (04/19/2023)  Financial Resource Strain: Medium Risk (04/19/2023)  Physical Activity: Inactive (04/19/2023)  Social Connections: Unknown (08/03/2023)   Received from [facility]  Stress: Stress Concern Present (04/19/2023)  Tobacco Use: Medium Risk (09/18/2023)    Readmission Risk Interventions    08/14/2023    1:14 PM 11/14/2022   12:51 PM  Readmission Risk Prevention Plan  Transportation Screening Complete Complete  PCP or Specialist Appt within 3-5 Days  Complete  HRI or Home Care Consult  Complete  Social Work Consult for Recovery Care Planning/Counseling  Complete  Palliative Care Screening  Not Applicable  Medication Review Oceanographer)  Referral to Pharmacy  PCP or Specialist appointment within 3-5 days of discharge Complete   HRI or Home Care Consult Complete   SW Recovery  Care/Counseling Consult Complete   Palliative Care Screening Not Applicable   Skilled Nursing Facility Not Applicable

## 2023-09-23 NOTE — Plan of Care (Signed)
  Problem: Health Behavior/Discharge Planning: Goal: Ability to manage health-related needs will improve Outcome: Progressing   Problem: Coping: Goal: Level of anxiety will decrease Outcome: Progressing   Problem: Safety: Goal: Ability to remain free from injury will improve Outcome: Progressing   Problem: Skin Integrity: Goal: Risk for impaired skin integrity will decrease Outcome: Progressing   

## 2023-09-24 DIAGNOSIS — N3000 Acute cystitis without hematuria: Secondary | ICD-10-CM | POA: Diagnosis not present

## 2023-09-24 MED ORDER — POLYETHYLENE GLYCOL 3350 17 G PO PACK
17.0000 g | PACK | Freq: Every day | ORAL | Status: DC
  Administered 2023-09-24 – 2023-09-26 (×3): 17 g via ORAL
  Filled 2023-09-24 (×3): qty 1

## 2023-09-24 NOTE — Progress Notes (Signed)
[facility] Progress Note Patient: Daniel Sherman QVZ:[id number] DOB: 07/11/1941 DOA: 09/18/2023  DOS: the patient was seen and examined on 09/24/2023  Brief hospital course: PMH of advanced dementia, type II DM, HLD, HTN, GERD, IDA present to the hospital with complaints of worsening confusion. At his baseline patient has severely advanced dementia.  Most of the time he is not able to carry out any conversation. He is not able to answer questions appropriately. He will mostly give you a blank stare when you try to talk to him per his son. For now plan is to continue with IV antibiotics and eventual transfer to [facility] with hospice support.   Assessment and Plan: Sepsis secondary to gram-negative bacteremia secondary to UTI. On presentation had hypotension, tachypnea and thrombocytopenia with acute kidney injury on chronic kidney disease.  UTI on admission. Urine and blood cultures came back positive for ESBL E. coli. Receiving IV meropenem. Mentation and hemodynamics are improving. Will continue IV antibiotics for total 7-day treatment.   Acute metabolic encephalopathy. Multifactorial, hypoglycemia, sepsis, psychotropic medications, delirium in the setting of dementia. Does not appear to have any focal deficit. EEG performed which is negative for any active seizures but does show evidence of triphasic waves concerning for toxic encephalopathy. Improving.  Will monitor.  Close to baseline per family.   History of advanced dementia with severe agitation. Patient is on multiple antipsychotic medication as well as psychotropic medications. Gradually resuming home medications.  Monitor.   Diarrhea. C. difficile negative.   Thrombocytopenia. Leukopenia In the setting of sepsis. Monitor.   Acute kidney injury on CKD stage IIIb. Baseline serum creatinine around 1.5. Worsened to 2.2.  Treated with IV fluid.  Now improving.  Will monitor for improvement without IV fluid.    Goals of care conversation. Patient's prognosis is very poor with given his advanced dementia. This is a second hospitalization and third ED visit in last 1 month. At present family's goal is to focus on continuing current care with limitations of the DNR but if the patient's condition deteriorates further family would be open to transition to complete comfort. 10/19.  Discussed with family at bedside.  Family's goal is to get the patient better for this admission and transition to hospice on discharge.  Will continue with antibiotics.  TOC consulted.  Family would like [place] inpatient hospice if the patient requires any inpatient hospice services down the road.  [facility] consulted.  Dysphagia. Speech therapy consulted. Advancing to D1 diet.   Subjective: No nausea no vomiting.  More interactive today.  Physical Exam: Clear to auscultation. S1-S2 present. Eye injury and lip injury present on admission from prior fall. Bowel sound present. Alert awake and oriented to self.  No tremors.  Data Reviewed: I have Reviewed nursing notes, Vitals, and Lab results. I have ordered test including CBC and BMP  .   Disposition: Status is: Inpatient Remains inpatient appropriate because: Need 7 days of IV antibiotic.  Last day on 10/23.  Likely discharge in 20/24.  SCDs Start: 09/18/23 1853   Family Communication: No one at bedside. Level of care: Med-Surg   Vitals:   09/23/23 1935 09/24/23 0338 09/24/23 1202 09/24/23 1946  BP: 138/64 118/66 104/66 122/61  Pulse: (!) 55 (!) 58 (!) 105 61  Resp: 16 18 16 20   Temp: 97.7 F (36.5 C) (!) 97.4 F (36.3 C) 98 F (36.7 C) 97.6 F (36.4 C)  TempSrc: Oral Oral Oral Oral  SpO2: 100% 97% 93% 100%  Weight:  Height:         Author: Lynden Oxford, MD 09/24/2023 8:27 PM  Please look on [email or web address] to find out who is on call.

## 2023-09-24 NOTE — TOC Progression Note (Signed)
Transition of Care [facility]) - Progression Note    Patient Details  Name: Daniel Sherman MRN: [id number] Date of Birth: 01-21-1941  Transition of Care [facility]) CM/SW Contact  Syre Knerr, Olegario Messier, RN Phone Number: 09/24/2023, 11:25 AM  Clinical Narrative: Confirmed with Shane(Son) & team for return back to [facility] care with [facility] following for hospice services.DNR(MD signature) PTAR @ d/c.      Expected Discharge Plan: Memory Care Barriers to Discharge: Continued Medical Work up  Expected Discharge Plan and Services In-house Referral: NA Discharge Planning Services: NA   Living arrangements for the past 2 months: Skilled Nursing Facility [facility])                   DME Agency: NA       HH Arranged:  (NA)           Social Determinants of Health (SDOH) Interventions SDOH Screenings   Food Insecurity: Patient Unable To Answer (09/18/2023)  Housing: Patient Unable To Answer (09/18/2023)  Transportation Needs: Patient Unable To Answer (09/18/2023)  Utilities: Patient Unable To Answer (09/18/2023)  Alcohol Screen: Low Risk  (04/19/2023)  Depression (PHQ2-9): Low Risk  (04/19/2023)  Financial Resource Strain: Medium Risk (04/19/2023)  Physical Activity: Inactive (04/19/2023)  Social Connections: Unknown (08/03/2023)   Received from [facility]  Stress: Stress Concern Present (04/19/2023)  Tobacco Use: Medium Risk (09/18/2023)    Readmission Risk Interventions    08/14/2023    1:14 PM 11/14/2022   12:51 PM  Readmission Risk Prevention Plan  Transportation Screening Complete Complete  PCP or Specialist Appt within 3-5 Days  Complete  HRI or Home Care Consult  Complete  Social Work Consult for Recovery Care Planning/Counseling  Complete  Palliative Care Screening  Not Applicable  Medication Review Oceanographer)  Referral to Pharmacy  PCP or Specialist appointment within 3-5 days of discharge Complete   HRI or Home Care Consult Complete   SW  Recovery Care/Counseling Consult Complete   Palliative Care Screening Not Applicable   Skilled Nursing Facility Not Applicable

## 2023-09-24 NOTE — Plan of Care (Signed)
  Problem: Activity: Goal: Risk for activity intolerance will decrease Outcome: Progressing   Problem: Nutrition: Goal: Adequate nutrition will be maintained Outcome: Progressing   Problem: Coping: Goal: Level of anxiety will decrease Outcome: Progressing   

## 2023-09-24 NOTE — [place] FL2 (Signed)
[place] MEDICAID FL2 LEVEL OF CARE FORM     IDENTIFICATION  Patient Name: Daniel Sherman Birthdate: [birth year] Sex: male Admission Date (Current Location): 09/18/2023  [facility] and [place] Number:  Producer, television/film/video and Address:  [facility],  [address] [place], [place] [address]      Provider Number: [id number]  Attending Physician Name and Address:  Rolly Salter, MD  Relative Name and Phone Number:  [name] Uriostegui(Son) [phone]    Current Level of Care: Hospital Recommended Level of Care: Memory Care Prior Approval Number:    Date Approved/Denied:   PASRR Number:    Discharge Plan: Other (Comment) (memory care)    Current Diagnoses: Patient Active Problem List   Diagnosis Date Noted   Acute respiratory failure with hypoxia (HCC) 08/14/2023   CAP (community acquired pneumonia) 08/13/2023   Hyperlipidemia 08/13/2023   COVID-19 08/13/2023   UTI (urinary tract infection) 05/10/2023   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   Thyroid nodule 01/18/2023   Pancytopenia (HCC) 11/14/2022   Sepsis (HCC) 11/13/2022   Dementia without behavioral disturbance (HCC) 11/13/2022   Primary hypertension 11/13/2022   Encephalopathy 11/12/2022   Acute metabolic encephalopathy 11/11/2022   Acute renal failure superimposed on stage 3b chronic kidney disease (HCC) 11/11/2022   Urinary tract infection 11/11/2022   Coronary atherosclerosis of native coronary artery 11/11/2022   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   Late onset Alzheimer's disease without behavioral disturbance (HCC) 01/13/2022   Gastroenteritis 06/17/2021   Abdominal pain    Nausea    Calculus of gallbladder with acute cholecystitis without obstruction    Goals of care, counseling/discussion 04/03/2020   IDA (iron deficiency anemia) 02/22/2020   Colon cancer s/p right colectomy (HCC) 08/12/2019   Hepatitis    Severe sepsis (HCC)    SIRS (systemic inflammatory response  syndrome) (HCC) 12/09/2016   Abnormal LFTs 12/09/2016   Acute viral syndrome 12/09/2016   Trigger finger of left thumb 02/03/2016   B12 deficiency 12/02/2015   S/P left THA, 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   Obese 02/16/2015   Atypical chest pain 06/11/2014   KNEE PAIN, LEFT 10/21/2008   Diabetes mellitus without complication (HCC) 06/04/2007   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   Primary thrombocytopenia (HCC) 06/04/2007   Essential hypertension 06/04/2007    Orientation RESPIRATION BLADDER Height & Weight     Self  Normal External catheter Weight: 70.5 kg Height:  5\' 5"  (165.1 cm)  BEHAVIORAL SYMPTOMS/MOOD NEUROLOGICAL BOWEL NUTRITION STATUS      Incontinent Diet (Dysphagia 3)  AMBULATORY STATUS COMMUNICATION OF NEEDS Skin   Limited Assist Verbally Normal                       Personal Care Assistance Level of Assistance  Bathing, Feeding, Dressing Bathing Assistance: Limited assistance Feeding assistance: Limited assistance Dressing Assistance: Limited assistance     Functional Limitations Info  Sight, Hearing, Speech Sight Info: Impaired (eyeglasses) Hearing Info: Impaired Speech Info: Impaired (dysphagia 3 diet)    SPECIAL CARE FACTORS FREQUENCY  PT (By licensed PT), OT (By licensed OT)     PT Frequency: 2x week OT Frequency: 2x week            Contractures Contractures Info: Not present    Additional Factors Info  Code Status, Allergies Code Status Info: DNR Allergies Info: not specified           Current Medications (09/24/2023):  This is the current hospital  active medication list Current Facility-Administered Medications  Medication Dose Route Frequency Provider Last Rate Last Admin   acetaminophen (TYLENOL) tablet 650 mg  650 mg Oral Q6H PRN Kirby Crigler, Mir M, MD   650 mg at 09/19/23 1650   Or   acetaminophen (TYLENOL) suppository 650 mg  650 mg Rectal Q6H PRN Kirby Crigler, Mir M, MD       albuterol (PROVENTIL) (2.5 MG/3ML) 0.083% nebulizer solution 2.5 mg   2.5 mg Nebulization Q2H PRN Kirby Crigler, Mir M, MD       donepezil (ARICEPT) tablet 10 mg  10 mg Oral BID Rolly Salter, MD   10 mg at 09/24/23 1006   escitalopram (LEXAPRO) tablet 10 mg  10 mg Oral Daily Rolly Salter, MD   10 mg at 09/24/23 1006   gabapentin (NEURONTIN) capsule 300 mg  300 mg Oral Daily Rolly Salter, MD   300 mg at 09/24/23 1006   haloperidol lactate (HALDOL) injection 2 mg  2 mg Intravenous Q6H PRN Rolly Salter, MD   2 mg at 09/20/23 1825   hydrALAZINE (APRESOLINE) injection 5 mg  5 mg Intravenous Q6H PRN Kirby Crigler, Mir M, MD       LORazepam (ATIVAN) tablet 0.5 mg  0.5 mg Oral Q4H PRN Rolly Salter, MD   0.5 mg at 09/23/23 1536   meropenem (MERREM) 1 g in sodium chloride 0.9 % 100 mL IVPB  1 g Intravenous Q12H Rolly Salter, MD 200 mL/hr at 09/24/23 0626 1 g at 09/24/23 0626   OLANZapine zydis (ZYPREXA) disintegrating tablet 5 mg  5 mg Oral QHS Rolly Salter, MD   5 mg at 09/23/23 2124   ondansetron (ZOFRAN) tablet 4 mg  4 mg Oral Q6H PRN Kirby Crigler, Mir M, MD       Or   ondansetron [facility]) injection 4 mg  4 mg Intravenous Q6H PRN Kirby Crigler, Mir M, MD       polyethylene glycol (MIRALAX / GLYCOLAX) packet 17 g  17 g Oral Daily Rolly Salter, MD       valproic acid (DEPAKENE) 250 MG/5ML solution 250 mg  250 mg Oral Q8H Rolly Salter, MD   250 mg at 09/24/23 7829     Discharge Medications: Please see discharge summary for a list of discharge medications.  Relevant Imaging Results:  Relevant Lab Results:   Additional Information SSN: [id number]  Lanier Clam, RN

## 2023-09-24 NOTE — Progress Notes (Signed)
Mobility Specialist - Progress Note   09/24/23 0956  Mobility  Activity Ambulated with assistance in room;Transferred from bed to chair  Level of Assistance Minimal assist, patient does 75% or more  Assistive Device Front wheel walker  Distance Ambulated (ft) 10 ft  Range of Motion/Exercises Active Assistive  Activity Response Tolerated fair  $Mobility charge 1 Mobility  Mobility Specialist Start Time (ACUTE ONLY) 0935  Mobility Specialist Stop Time (ACUTE ONLY) 0956  Mobility Specialist Time Calculation (min) (ACUTE ONLY) 21 min   Pt was found in bed and agreeable to sit on recliner chair. Was min-A for bed mobility and CG for ambulation and STS. Requires physical and verbal cues to ambulate in room. Gets distracted easily but able to be redirected and follows cues. At EOS was left on recliner chair with all needs met. Call bell in reach and chair alarm on.  Billey Chang Mobility Specialist

## 2023-09-25 DIAGNOSIS — A498 Other bacterial infections of unspecified site: Secondary | ICD-10-CM

## 2023-09-25 DIAGNOSIS — G9341 Metabolic encephalopathy: Secondary | ICD-10-CM

## 2023-09-25 DIAGNOSIS — N179 Acute kidney failure, unspecified: Secondary | ICD-10-CM

## 2023-09-25 DIAGNOSIS — Z1612 Extended spectrum beta lactamase (ESBL) resistance: Secondary | ICD-10-CM

## 2023-09-25 DIAGNOSIS — N3000 Acute cystitis without hematuria: Secondary | ICD-10-CM | POA: Diagnosis not present

## 2023-09-25 DIAGNOSIS — R7881 Bacteremia: Secondary | ICD-10-CM

## 2023-09-25 DIAGNOSIS — N1832 Chronic kidney disease, stage 3b: Secondary | ICD-10-CM

## 2023-09-25 LAB — CBC
HCT: 28.8 % — ABNORMAL LOW (ref 39.0–52.0)
Hemoglobin: 9.3 g/dL — ABNORMAL LOW (ref 13.0–17.0)
MCH: 27.9 pg (ref 26.0–34.0)
MCHC: 32.3 g/dL (ref 30.0–36.0)
MCV: 86.5 fL (ref 80.0–100.0)
Platelets: 102 10*3/uL — ABNORMAL LOW (ref 150–400)
RBC: 3.33 MIL/uL — ABNORMAL LOW (ref 4.22–5.81)
RDW: 13.8 % (ref 11.5–15.5)
WBC: 2.9 10*3/uL — ABNORMAL LOW (ref 4.0–10.5)
nRBC: 0 % (ref 0.0–0.2)

## 2023-09-25 LAB — BASIC METABOLIC PANEL
Anion gap: 6 (ref 5–15)
BUN: 21 mg/dL (ref 8–23)
CO2: 25 mmol/L (ref 22–32)
Calcium: 8.1 mg/dL — ABNORMAL LOW (ref 8.9–10.3)
Chloride: 105 mmol/L (ref 98–111)
Creatinine, Ser: 1.22 mg/dL (ref 0.61–1.24)
GFR, Estimated: 59 mL/min — ABNORMAL LOW (ref >60)
Glucose, Bld: 94 mg/dL (ref 70–99)
Potassium: 4.7 mmol/L (ref 3.5–5.1)
Sodium: 136 mmol/L (ref 135–145)

## 2023-09-25 NOTE — Progress Notes (Signed)
Progress Note    Daniel Sherman   WUJ:[id number]  DOB: August 30, 1941  DOA: 09/18/2023     7 PCP: Jerl Mina, MD  Initial CC: AMS  Hospital Course: Brief hospital course: Daniel Sherman is an 82 yo male with PMH of advanced dementia, type II DM, HLD, HTN, GERD, IDA who presented to the hospital with complaints of worsening confusion. At baseline, patient has severely advanced dementia.  Most of the time he is not able to carry out any conversation. He is not able to answer questions appropriately. He will mostly give you a blank stare when you try to talk to him per his son. For now plan is to continue with IV antibiotics and eventual transfer to [facility] with hospice support.   Assessment and Plan: Sepsis secondary to ESBL Ecoli UTI and bacteremia On presentation had hypotension, tachypnea and thrombocytopenia with acute kidney injury on chronic kidney disease.  UTI on admission. 4/4 bottles also positive for same Receiving IV meropenem. 7 day course to be completed on 10/23 - plan for d/c back to [facility] on 10/24  Acute metabolic encephalopathy Multifactorial, hypoglycemia, sepsis, psychotropic medications, delirium in the setting of dementia. Does not appear to have any focal deficit. EEG performed which is negative for any active seizures but does show evidence of triphasic waves concerning for toxic encephalopathy. Improving.  Will monitor.  Close to baseline per family.   History of advanced dementia with severe agitation. Patient is on multiple antipsychotic medication as well as psychotropic medications. Gradually resuming home medications.  Monitor.   Diarrhea. C. difficile negative.   Thrombocytopenia. Leukopenia In the setting of sepsis. Monitor. Improving   Acute kidney injury on CKD stage IIIb. Baseline serum creatinine around 1.5. Worsened to 2.2.  Treated with IV fluid.  Now improved.   Goals of care Patient's prognosis is very poor with given his  advanced dementia. This is a second hospitalization and third ED visit in last 1 month. At present family's goal is to focus on continuing current care with limitations of the DNR but if the patient's condition deteriorates further family would be open to transition to complete comfort. 10/19.  Discussed with family at bedside.  Family's goal is to get the patient better for this admission and transition to hospice on discharge.  Will continue with antibiotics.  TOC consulted.  Family would like [place] inpatient hospice if the patient requires any inpatient hospice services down the road.  [facility] consulted.  Dysphagia. Speech therapy consulted. - now on dysphagia 3 diet  Interval History:  No events overnight.  Resting comfortably in bed when seen this morning.  Obvious underlying dementia appreciated.  No acute distress.   Old records reviewed in assessment of this patient  Antimicrobials: Meropenem 09/19/2023 >> 09/25/2023  DVT prophylaxis:  SCDs Start: 09/18/23 1853   Code Status:   Code Status: Limited: Do not attempt resuscitation (DNR) -DNR-LIMITED -Do Not Intubate/DNI   Mobility Assessment (Last 72 Hours)     Mobility Assessment     Row Name 09/25/23 0948 09/24/23 2028 09/24/23 0815 09/23/23 2159 09/23/23 2158   Does patient have an order for bedrest or is patient medically unstable No - Continue assessment No - Continue assessment No - Continue assessment -- No - Continue assessment   What is the highest level of mobility based on the progressive mobility assessment? Level 5 (Walks with assist in room/hall) - Balance while stepping forward/back and can walk in room with assist - Complete Level 5 (  Walks with assist in room/hall) - Balance while stepping forward/back and can walk in room with assist - Complete Level 5 (Walks with assist in room/hall) - Balance while stepping forward/back and can walk in room with assist - Complete Level 3 (Stands with assist) - Balance  while standing  and cannot march in place Level 4 (Walks with assist in room) - Balance while marching in place and cannot step forward and back - Complete   Is the above level different from baseline mobility prior to current illness? Yes - Recommend PT order No - Consider discontinuing PT/OT No - Consider discontinuing PT/OT -- --    Row Name 09/23/23 1109 09/23/23 1018 09/22/23 2322       Does patient have an order for bedrest or is patient medically unstable -- No - Continue assessment No - Continue assessment     What is the highest level of mobility based on the progressive mobility assessment? Level 4 (Walks with assist in room) - Balance while marching in place and cannot step forward and back - Complete Level 3 (Stands with assist) - Balance while standing  and cannot march in place Level 3 (Stands with assist) - Balance while standing  and cannot march in place              Barriers to discharge: none Disposition Plan:  [place] Status is: Inpt  Objective: Blood pressure (!) 115/54, pulse (!) 56, temperature (!) 97.5 F (36.4 C), temperature source Oral, resp. rate 15, height 5\' 5"  (1.651 m), weight 70.5 kg, SpO2 100%.  Examination:  Physical Exam Constitutional:      General: He is not in acute distress.    Appearance: Normal appearance.  HENT:     Head: Normocephalic and atraumatic.     Mouth/Throat:     Mouth: Mucous membranes are moist.  Eyes:     Extraocular Movements: Extraocular movements intact.  Cardiovascular:     Rate and Rhythm: Normal rate and regular rhythm.  Pulmonary:     Effort: Pulmonary effort is normal. No respiratory distress.     Breath sounds: Normal breath sounds. No wheezing.  Abdominal:     General: Bowel sounds are normal. There is no distension.     Palpations: Abdomen is soft.     Tenderness: There is no abdominal tenderness.  Musculoskeletal:        General: Normal range of motion.     Cervical back: Normal range of motion and neck  supple.  Skin:    General: Skin is warm and dry.  Neurological:     Mental Status: He is alert. Mental status is at baseline. He is disoriented.     Comments: Follows commands, moves all 4 extremities. Obvious underlying dementia appreciated  Psychiatric:        Mood and Affect: Mood normal.        Behavior: Behavior normal.      Consultants:    Procedures:    Data Reviewed: Results for orders placed or performed during the hospital encounter of 09/18/23 (from the past 24 hour(s))  Basic metabolic panel     Status: Abnormal   Collection Time: 09/25/23  5:12 AM  Result Value Ref Range   Sodium 136 135 - 145 mmol/L   Potassium 4.7 3.5 - 5.1 mmol/L   Chloride 105 98 - 111 mmol/L   CO2 25 22 - 32 mmol/L   Glucose, Bld 94 70 - 99 mg/dL   BUN 21 8 - 23  mg/dL   Creatinine, Ser 4.09 0.61 - 1.24 mg/dL   Calcium 8.1 (L) 8.9 - 10.3 mg/dL   GFR, Estimated 59 (L) >60 mL/min   Anion gap 6 5 - 15  CBC     Status: Abnormal   Collection Time: 09/25/23  5:12 AM  Result Value Ref Range   WBC 2.9 (L) 4.0 - 10.5 K/uL   RBC 3.33 (L) 4.22 - 5.81 MIL/uL   Hemoglobin 9.3 (L) 13.0 - 17.0 g/dL   HCT 81.1 (L) 91.4 - 78.2 %   MCV 86.5 80.0 - 100.0 fL   MCH 27.9 26.0 - 34.0 pg   MCHC 32.3 30.0 - 36.0 g/dL   RDW 95.6 21.3 - 08.6 %   Platelets 102 (L) 150 - 400 K/uL   nRBC 0.0 0.0 - 0.2 %    I have reviewed pertinent nursing notes, vitals, labs, and images as necessary. I have ordered labwork to follow up on as indicated.  I have reviewed the last notes from staff over past 24 hours. I have discussed patient's care plan and test results with nursing staff, CM/SW, and other staff as appropriate.  Time spent: Greater than 50% of the 55 minute visit was spent in counseling/coordination of care for the patient as laid out in the A&P.   LOS: 7 days   Lewie Chamber, MD [facility] 09/25/2023, 5:20 PM

## 2023-09-25 NOTE — Progress Notes (Signed)
PT Cancellation Note  Patient Details Name: MERIT HERPEL MRN: [id number] DOB: 11-28-1941   Cancelled Treatment:    Reason Eval/Treat Not Completed: Other (comment). Per chart review and conversation wit with nurse, pt plan to d/c to [facility] place with hospice. Will sign off at this time.   Domenick Bookbinder PT, DPT 09/25/23, 1:29 PM

## 2023-09-25 NOTE — TOC Progression Note (Signed)
Transition of Care [facility]) - Progression Note    Patient Details  Name: Daniel Sherman MRN: [id number] Date of Birth: 07-28-1941  Transition of Care [facility]) CM/SW Contact  Noralyn Karim, Olegario Messier, RN Phone Number: 09/25/2023, 1:39 PM  Clinical Narrative: Received request from [facility] rep Latoya to re send signed fl2, PT eval to email [email or web address] also sent via hub to fax# listed [phone]. D/c plan for return to [facility] memory care w/[facility] following.PTAR @ d/c.      Expected Discharge Plan: Memory Care Barriers to Discharge: Continued Medical Work up  Expected Discharge Plan and Services In-house Referral: NA Discharge Planning Services: NA   Living arrangements for the past 2 months: Skilled Nursing Facility [facility])                   DME Agency: NA       HH Arranged:  (NA)           Social Determinants of Health (SDOH) Interventions SDOH Screenings   Food Insecurity: Patient Unable To Answer (09/18/2023)  Housing: Patient Unable To Answer (09/18/2023)  Transportation Needs: Patient Unable To Answer (09/18/2023)  Utilities: Patient Unable To Answer (09/18/2023)  Alcohol Screen: Low Risk  (04/19/2023)  Depression (PHQ2-9): Low Risk  (04/19/2023)  Financial Resource Strain: Medium Risk (04/19/2023)  Physical Activity: Inactive (04/19/2023)  Social Connections: Unknown (08/03/2023)   Received from [facility]  Stress: Stress Concern Present (04/19/2023)  Tobacco Use: Medium Risk (09/18/2023)    Readmission Risk Interventions    09/24/2023   11:27 AM 08/14/2023    1:14 PM 11/14/2022   12:51 PM  Readmission Risk Prevention Plan  Transportation Screening Complete Complete Complete  PCP or Specialist Appt within 3-5 Days   Complete  HRI or Home Care Consult   Complete  Social Work Consult for Recovery Care Planning/Counseling   Complete  Palliative Care Screening   Not Applicable  Medication Review [employer]) Complete   Referral to Pharmacy  PCP or Specialist appointment within 3-5 days of discharge Complete Complete   HRI or Home Care Consult Complete Complete   SW Recovery Care/Counseling Consult Complete Complete   Palliative Care Screening Complete Not Applicable   Skilled Nursing Facility Not Applicable Not Applicable

## 2023-09-26 DIAGNOSIS — Z7189 Other specified counseling: Secondary | ICD-10-CM

## 2023-09-26 DIAGNOSIS — A498 Other bacterial infections of unspecified site: Secondary | ICD-10-CM | POA: Diagnosis not present

## 2023-09-26 DIAGNOSIS — N3000 Acute cystitis without hematuria: Secondary | ICD-10-CM | POA: Diagnosis not present

## 2023-09-26 DIAGNOSIS — R7881 Bacteremia: Secondary | ICD-10-CM | POA: Diagnosis not present

## 2023-09-26 MED ORDER — LORAZEPAM 0.5 MG PO TABS: 0.5000 mg | ORAL_TABLET | Freq: Every day | ORAL | 0 refills | Status: DC

## 2023-09-26 NOTE — Progress Notes (Signed)
Called [facility] room# 11 RN and gave report at [phone]

## 2023-09-26 NOTE — Plan of Care (Signed)
  Problem: Coping: Goal: Psychosocial and spiritual needs will be supported Outcome: Adequate for Discharge   Problem: Education: Goal: Knowledge of risk factors and measures for prevention of condition will improve Outcome: Adequate for Discharge   Problem: Respiratory: Goal: Will maintain a patent airway Outcome: Adequate for Discharge Goal: Complications related to the disease process, condition or treatment will be avoided or minimized Outcome: Adequate for Discharge   Problem: Fluid Volume: Goal: Hemodynamic stability will improve Outcome: Adequate for Discharge   Problem: Clinical Measurements: Goal: Diagnostic test results will improve Outcome: Adequate for Discharge Goal: Signs and symptoms of infection will decrease Outcome: Adequate for Discharge

## 2023-09-26 NOTE — Discharge Summary (Signed)
Physician Discharge Summary   [facility] UEA:[id number] DOB: Aug 10, 1941 DOA: 09/18/2023  PCP: Daniel Mina, MD  Admit date: 09/18/2023 Discharge date: 09/26/2023   Admitted From: Daniel Sherman Disposition:  Daniel Sherman Discharging physician: Daniel Chamber, MD Barriers to discharge: none  Recommendations at discharge: Consider hospice referral if any further decline  Discharge Condition: stable CODE STATUS: DNR Diet recommendation:  Diet Orders (From admission, onward)     Start     Ordered   09/24/23 0721  DIET DYS 3 Room service appropriate? Yes; Fluid consistency: Thin  Diet effective now       Question Answer Comment  Room service appropriate? Yes   Fluid consistency: Thin      09/24/23 0720            Hospital Course: Brief hospital course: Daniel Sherman is an 82 yo male with PMH of advanced dementia, type II DM, HLD, HTN, GERD, IDA who presented to the hospital with complaints of worsening confusion. At baseline, patient has severely advanced dementia.  Most of the time he is not able to carry out any conversation. He is not able to answer questions appropriately. He will mostly give you a blank stare when you try to talk to him per his son. He completed IV antibiotic course during hospitalization.  Assessment and Plan: Sepsis secondary to ESBL Ecoli UTI and bacteremia On presentation had hypotension, tachypnea and thrombocytopenia with acute kidney injury on chronic kidney disease.  UTI on admission. 4/4 bottles also positive for same Receiving IV meropenem. 7 day course completed on 10/23 - plan for d/c back to [facility] on 10/24  Acute metabolic encephalopathy Multifactorial, hypoglycemia, sepsis, psychotropic medications, delirium in the setting of dementia. Does not appear to have any focal deficit. EEG performed which is negative for any active seizures but does show evidence of triphasic waves concerning for toxic encephalopathy. Improving.  Will monitor.   Close to baseline per family.   History of advanced dementia with severe agitation. Patient is on multiple antipsychotic medication as well as psychotropic medications. Gradually resuming home medications.  Monitor.   Diarrhea. C. difficile negative.   Thrombocytopenia. Leukopenia In the setting of sepsis. Monitor. Improving   Acute kidney injury on CKD stage IIIb. Baseline serum creatinine around 1.5. Worsened to 2.2.  Treated with IV fluid.  Now improved.   Goals of care Patient's prognosis is very poor with given his advanced dementia. This is a second hospitalization and third ED visit in last 1 month. At present family's goal is to focus on continuing current care with limitations of the DNR but if the patient's condition deteriorates further family would be open to transition to complete comfort. 10/19.  Discussed with family at bedside.  Family's goal is to get the patient better for this admission and transition to hospice on discharge.  Will continue with antibiotics.  TOC consulted.  Family would like [place] inpatient hospice if the patient requires any inpatient hospice services down the road.  Daniel Sherman consulted.  Dysphagia. Speech therapy consulted. - now on dysphagia 3 diet   Principal Diagnosis: UTI (urinary tract infection)  Discharge Diagnoses: Active Hospital Problems   Diagnosis Date Noted   UTI (urinary tract infection) 05/10/2023    Priority: 1.   Infection due to ESBL-producing Escherichia coli 09/25/2023    Priority: 1.   Bacteremia 09/25/2023    Priority: 1.   Acute metabolic encephalopathy 11/11/2022    Priority: 3.   Acute renal failure superimposed on stage 3b chronic kidney  disease (HCC) 11/11/2022    Priority: 3.   Goals of care, counseling/discussion 04/03/2020    Priority: 5.   Essential hypertension 06/04/2007   Diabetes mellitus without complication (HCC) 06/04/2007    Resolved Hospital Problems  No resolved problems to  display.     Discharge Instructions     Increase activity slowly   Complete by: As directed       Allergies as of 09/26/2023       Reactions   Other    BLOOD PRODUCT REFUSAL        Medication List     TAKE these medications    atorvastatin 20 MG tablet Commonly known as: LIPITOR Take 20 mg by mouth daily.   cholecalciferol 25 MCG (1000 UNIT) tablet Commonly known as: VITAMIN D3 Take 1,000 Units by mouth daily.   colesevelam 625 MG tablet Commonly known as: WELCHOL Take 1,875 mg by mouth 2 (two) times daily with a meal.   divalproex 250 MG DR tablet Commonly known as: DEPAKOTE Take 250 mg by mouth 3 (three) times daily.   donepezil 10 MG tablet Commonly known as: ARICEPT Take 10 mg by mouth daily.   escitalopram 10 MG tablet Commonly known as: LEXAPRO Take 10 mg by mouth 2 (two) times daily.   ferrous sulfate 325 (65 FE) MG EC tablet Take 325 mg by mouth daily with breakfast.   gabapentin 300 MG capsule Commonly known as: NEURONTIN Take 300 mg by mouth daily.   hydrochlorothiazide 12.5 MG tablet Commonly known as: HYDRODIURIL Take 12.5 mg by mouth daily.   lisinopril 10 MG tablet Commonly known as: ZESTRIL Take 1 tablet (10 mg total) by mouth daily.   LORazepam 0.5 MG tablet Commonly known as: ATIVAN Take 1 tablet (0.5 mg total) by mouth daily.   OLANZapine zydis 5 MG disintegrating tablet Commonly known as: ZYPREXA Take 5 mg by mouth at bedtime.        Contact information for after-discharge care     Destination     [facility] .   Service: Assisted Living Contact information: [address] [place] [address] [phone]                    Allergies  Allergen Reactions   Other     BLOOD PRODUCT REFUSAL    Consultations:   Procedures:   Discharge Exam: BP (!) 129/53 (BP Location: Right Arm)   Pulse (!) 59   Temp (!) 97.3 F (36.3 C) (Oral)   Resp 18   Ht 5\' 5"  (1.651 m)    Wt 70.5 kg   SpO2 (!) 86%   BMI 25.86 kg/m  Physical Exam Constitutional:      General: He is not in acute distress.    Appearance: Normal appearance.  HENT:     Head: Normocephalic and atraumatic.     Mouth/Throat:     Mouth: Mucous membranes are moist.  Eyes:     Extraocular Movements: Extraocular movements intact.  Cardiovascular:     Rate and Rhythm: Normal rate and regular rhythm.  Pulmonary:     Effort: Pulmonary effort is normal. No respiratory distress.     Breath sounds: Normal breath sounds. No wheezing.  Abdominal:     General: Bowel sounds are normal. There is no distension.     Palpations: Abdomen is soft.     Tenderness: There is no abdominal tenderness.  Musculoskeletal:        General: Normal range  of motion.     Cervical back: Normal range of motion and neck supple.  Skin:    General: Skin is warm and dry.  Neurological:     Mental Status: He is alert. Mental status is at baseline. He is disoriented.     Comments: Follows commands, moves all 4 extremities. Obvious underlying dementia appreciated  Psychiatric:        Mood and Affect: Mood normal.        Behavior: Behavior normal.      The results of significant diagnostics from this hospitalization (including imaging, microbiology, ancillary and laboratory) are listed below for reference.   Microbiology: Recent Results (from the past 240 hour(s))  Urine Culture     Status: Abnormal   Collection Time: 09/18/23  2:26 PM   Specimen: Urine, Random  Result Value Ref Range Status   Specimen Description   Final    URINE, RANDOM Performed at [facility], [address], [place], [place] [address]    Special Requests   Final    NONE Reflexed from [id number] Performed at [facility], [address] [place], [place] [address]    Culture (A)  Final    >=100,000 COLONIES/mL ESCHERICHIA COLI Confirmed Extended Spectrum Beta-Lactamase Producer (ESBL).  In bloodstream  infections from ESBL organisms, carbapenems are preferred over piperacillin/tazobactam. They are shown to have a lower risk of mortality.    Report Status 09/20/2023 FINAL  Final   Organism ID, Bacteria ESCHERICHIA COLI (A)  Final      Susceptibility   Escherichia coli - MIC*    AMPICILLIN >=32 RESISTANT Resistant     CEFAZOLIN >=64 RESISTANT Resistant     CEFEPIME 16 RESISTANT Resistant     CEFTRIAXONE >=64 RESISTANT Resistant     CIPROFLOXACIN >=4 RESISTANT Resistant     GENTAMICIN >=16 RESISTANT Resistant     IMIPENEM <=0.25 SENSITIVE Sensitive     NITROFURANTOIN <=16 SENSITIVE Sensitive     TRIMETH/SULFA >=320 RESISTANT Resistant     AMPICILLIN/SULBACTAM >=32 RESISTANT Resistant     PIP/TAZO >=128 RESISTANT Resistant ug/mL    * >=100,000 COLONIES/mL ESCHERICHIA COLI  Culture, blood (routine x 2)     Status: Abnormal   Collection Time: 09/18/23  2:39 PM   Specimen: BLOOD LEFT FOREARM  Result Value Ref Range Status   Specimen Description   Final    BLOOD LEFT FOREARM Performed at [facility], [address], [place], [place] [address]    Special Requests   Final    BOTTLES DRAWN AEROBIC AND ANAEROBIC Blood Culture results may not be optimal due to an excessive volume of blood received in culture bottles Performed at [facility], [address] [place], [place] [address]    Culture  Setup Time   Final    GRAM NEGATIVE RODS IN BOTH AEROBIC AND ANAEROBIC BOTTLES CRITICAL RESULT CALLED TO, READ BACK BY AND VERIFIED WITH: PHARMD CHRISTINE SHADE ON 09/19/23 @ 1636 BY DRT    Culture (A)  Final    ESCHERICHIA COLI Confirmed Extended Spectrum Beta-Lactamase Producer (ESBL).  In bloodstream infections from ESBL organisms, carbapenems are preferred over piperacillin/tazobactam. They are shown to have a lower risk of mortality. STAPHYLOCOCCUS EPIDERMIDIS THE SIGNIFICANCE OF ISOLATING THIS ORGANISM FROM A SINGLE SET OF BLOOD CULTURES WHEN MULTIPLE  SETS ARE DRAWN IS UNCERTAIN. PLEASE NOTIFY THE MICROBIOLOGY DEPARTMENT WITHIN ONE WEEK IF SPECIATION AND SENSITIVITIES ARE REQUIRED. Performed at [facility], [address], [place], [place] [address]    Report Status 09/22/2023 FINAL  Final   Organism ID, Bacteria ESCHERICHIA COLI  Final      Susceptibility   Escherichia coli - MIC*    AMPICILLIN >=32 RESISTANT Resistant     CEFEPIME >=32 RESISTANT Resistant     CEFTAZIDIME >=64 RESISTANT Resistant     CEFTRIAXONE >=64 RESISTANT Resistant     CIPROFLOXACIN >=4 RESISTANT Resistant     GENTAMICIN >=16 RESISTANT Resistant     IMIPENEM <=0.25 SENSITIVE Sensitive     TRIMETH/SULFA >=320 RESISTANT Resistant     AMPICILLIN/SULBACTAM >=32 RESISTANT Resistant     PIP/TAZO >=128 RESISTANT Resistant ug/mL    * ESCHERICHIA COLI  Blood Culture ID Panel (Reflexed)     Status: Abnormal   Collection Time: 09/18/23  2:39 PM  Result Value Ref Range Status   Enterococcus faecalis NOT DETECTED NOT DETECTED Final   Enterococcus Faecium NOT DETECTED NOT DETECTED Final   Listeria monocytogenes NOT DETECTED NOT DETECTED Final   Staphylococcus species DETECTED (A) NOT DETECTED Final    Comment: CRITICAL RESULT CALLED TO, READ BACK BY AND VERIFIED WITH: PHARMD CHRISTINE SHADE ON 09/19/23 @ 1636 BY DRT    Staphylococcus aureus (BCID) NOT DETECTED NOT DETECTED Final   Staphylococcus epidermidis DETECTED (A) NOT DETECTED Final    Comment: CRITICAL RESULT CALLED TO, READ BACK BY AND VERIFIED WITH: PHARMD CHRISTINE SHADE ON 09/19/23 @ 1636 BY DRT    Staphylococcus lugdunensis NOT DETECTED NOT DETECTED Final   Streptococcus species NOT DETECTED NOT DETECTED Final   Streptococcus agalactiae NOT DETECTED NOT DETECTED Final   Streptococcus pneumoniae NOT DETECTED NOT DETECTED Final   Streptococcus pyogenes NOT DETECTED NOT DETECTED Final   A.calcoaceticus-baumannii NOT DETECTED NOT DETECTED Final   Bacteroides fragilis NOT DETECTED NOT DETECTED Final    Enterobacterales DETECTED (A) NOT DETECTED Final    Comment: Enterobacterales represent a large order of gram negative bacteria, not a single organism. CRITICAL RESULT CALLED TO, READ BACK BY AND VERIFIED WITH: PHARMD CHRISTINE SHADE ON 09/19/23 @ 1636 BY DRT    Enterobacter cloacae complex NOT DETECTED NOT DETECTED Final   Escherichia coli DETECTED (A) NOT DETECTED Final    Comment: CRITICAL RESULT CALLED TO, READ BACK BY AND VERIFIED WITH: PHARMD CHRISTINE SHADE ON 09/19/23 @ 1636 BY DRT    Klebsiella aerogenes NOT DETECTED NOT DETECTED Final   Klebsiella oxytoca NOT DETECTED NOT DETECTED Final   Klebsiella pneumoniae NOT DETECTED NOT DETECTED Final   Proteus species NOT DETECTED NOT DETECTED Final   Salmonella species NOT DETECTED NOT DETECTED Final   Serratia marcescens NOT DETECTED NOT DETECTED Final   Haemophilus influenzae NOT DETECTED NOT DETECTED Final   Neisseria meningitidis NOT DETECTED NOT DETECTED Final   Pseudomonas aeruginosa NOT DETECTED NOT DETECTED Final   Stenotrophomonas maltophilia NOT DETECTED NOT DETECTED Final   Candida albicans NOT DETECTED NOT DETECTED Final   Candida auris NOT DETECTED NOT DETECTED Final   Candida glabrata NOT DETECTED NOT DETECTED Final   Candida krusei NOT DETECTED NOT DETECTED Final   Candida parapsilosis NOT DETECTED NOT DETECTED Final   Candida tropicalis NOT DETECTED NOT DETECTED Final   Cryptococcus neoformans/gattii NOT DETECTED NOT DETECTED Final   CTX-M ESBL DETECTED (A) NOT DETECTED Final    Comment: CRITICAL RESULT CALLED TO, READ BACK BY AND VERIFIED WITH: PHARMD CHRISTINE SHADE ON 09/19/23 @ 1636 BY DRT (NOTE) Extended spectrum beta-lactamase detected. Recommend a carbapenem as initial therapy.  Carbapenem resistance IMP NOT DETECTED NOT DETECTED Final   Carbapenem resistance KPC NOT DETECTED NOT DETECTED Final   Methicillin resistance mecA/C NOT DETECTED NOT DETECTED Final   Carbapenem resistance NDM NOT DETECTED  NOT DETECTED Final   Carbapenem resist OXA 48 LIKE NOT DETECTED NOT DETECTED Final   Carbapenem resistance VIM NOT DETECTED NOT DETECTED Final    Comment: Performed at [facility], [address], [place], [place] [address]  Culture, blood (routine x 2)     Status: Abnormal   Collection Time: 09/18/23  9:04 PM   Specimen: BLOOD LEFT HAND  Result Value Ref Range Status   Specimen Description   Final    BLOOD LEFT HAND Performed at [facility], [address] [place], [place] [address]    Special Requests   Final    BOTTLES DRAWN AEROBIC AND ANAEROBIC Blood Culture adequate volume Performed at [facility], [address], [place], [place] [address]    Culture  Setup Time   Final    GRAM NEGATIVE RODS IN BOTH AEROBIC AND ANAEROBIC BOTTLES CRITICAL VALUE NOTED.  VALUE IS CONSISTENT WITH PREVIOUSLY REPORTED AND CALLED VALUE.    Culture (A)  Final    ESCHERICHIA COLI SUSCEPTIBILITIES PERFORMED ON PREVIOUS CULTURE WITHIN THE LAST 5 DAYS. Performed at [facility], [address], [place], [place] [address]    Report Status 09/22/2023 FINAL  Final  MRSA Next Gen by PCR, Nasal     Status: None   Collection Time: 09/19/23  1:09 AM   Specimen: Nasal Mucosa; Nasal Swab  Result Value Ref Range Status   MRSA by PCR Next Gen NOT DETECTED NOT DETECTED Final    Comment: (NOTE) The GeneXpert MRSA Assay (FDA approved for NASAL specimens only), is one component of a comprehensive MRSA colonization surveillance program. It is not intended to diagnose MRSA infection nor to guide or monitor treatment for MRSA infections. Test performance is not FDA approved in patients less than 36 years old. Performed at [facility], [address] [place], [place] [address]   C Difficile Quick Screen w PCR reflex     Status: None   Collection Time: 09/20/23  8:59 AM   Specimen: STOOL  Result Value Ref Range Status   C Diff antigen NEGATIVE NEGATIVE  Final   C Diff toxin NEGATIVE NEGATIVE Final   C Diff interpretation No C. difficile detected.  Final    Comment: Performed at [facility], [address], [place], [place] [address]     Labs: BNP (last 3 results) Recent Labs    08/13/23 0020  BNP 13.7   Basic Metabolic Panel: Recent Labs  Lab 09/20/23 0923 09/21/23 0838 09/22/23 0525 09/23/23 0449 09/25/23 0512  NA 142 142 145 141 136  K 4.3 3.6 3.6 3.9 4.7  CL 110 111 112* 108 105  CO2 23 25 27 25 25   GLUCOSE 111* 128* 110* 109* 94  BUN 33* 29* 26* 26* 21  CREATININE 1.77* 1.62* 1.42* 1.39* 1.22  CALCIUM 7.8* 7.7* 8.4* 8.0* 8.1*  MG 2.0 1.7 1.8 1.8  --   PHOS 2.6  --   --   --   --    Liver Function Tests: Recent Labs  Lab 09/19/23 1645 09/20/23 0923 09/22/23 0525 09/23/23 0449  AST 20 23 31 27   ALT 12 12 22 24   ALKPHOS 51 43 44 44  BILITOT 0.8 1.0 0.8 0.5  PROT 5.9* 5.5* 5.4* 4.8*  ALBUMIN 2.9* 2.4* 2.4* 2.2*   No results for input(s): "LIPASE", "AMYLASE" in the last 168 hours. Recent Labs  Lab 09/19/23 1645  AMMONIA 21   CBC: Recent Labs  Lab 09/19/23 1645 09/20/23 1056 09/21/23 0838 09/22/23 0525 09/23/23 0449 09/25/23 0512  WBC 3.4* 3.2* 1.3* 1.5* 2.0* 2.9*  NEUTROABS 3.0 2.0  --  0.9* 0.9*  --   HGB 10.0* 10.0* 8.4* 9.2* 8.6* 9.3*  HCT 32.4* 33.1* 27.7* 31.3* 27.3* 28.8*  MCV 90.0 90.7 89.9 92.3 87.8 86.5  PLT 48* 37* 45* 52* 67* 102*   Cardiac Enzymes: No results for input(s): "CKTOTAL", "CKMB", "CKMBINDEX", "TROPONINI" in the last 168 hours. BNP: Invalid input(s): "POCBNP" CBG: Recent Labs  Lab 09/21/23 0454 09/21/23 0739 09/21/23 1210 09/21/23 1602 09/22/23 0007  GLUCAP 127* 136* 149* 109* 141*   D-Dimer No results for input(s): "DDIMER" in the last 72 hours. Hgb A1c No results for input(s): "HGBA1C" in the last 72 hours. Lipid Profile No results for input(s): "CHOL", "HDL", "LDLCALC", "TRIG", "CHOLHDL", "LDLDIRECT" in the last 72 hours. Thyroid  function studies No results for input(s): "TSH", "T4TOTAL", "T3FREE", "THYROIDAB" in the last 72 hours.  Invalid input(s): "FREET3" Anemia work up No results for input(s): "VITAMINB12", "FOLATE", "FERRITIN", "TIBC", "IRON", "RETICCTPCT" in the last 72 hours. Urinalysis    Component Value Date/Time   COLORURINE YELLOW 09/18/2023 1426   APPEARANCEUR HAZY (A) 09/18/2023 1426   LABSPEC 1.012 09/18/2023 1426   PHURINE 6.0 09/18/2023 1426   GLUCOSEU NEGATIVE 09/18/2023 1426   HGBUR MODERATE (A) 09/18/2023 1426   BILIRUBINUR NEGATIVE 09/18/2023 1426   KETONESUR 5 (A) 09/18/2023 1426   PROTEINUR 100 (A) 09/18/2023 1426   UROBILINOGEN 1.0 02/09/2015 1005   NITRITE NEGATIVE 09/18/2023 1426   LEUKOCYTESUR LARGE (A) 09/18/2023 1426   Sepsis Labs Recent Labs  Lab 09/21/23 0838 09/22/23 0525 09/23/23 0449 09/25/23 0512  WBC 1.3* 1.5* 2.0* 2.9*   Microbiology Recent Results (from the past 240 hour(s))  Urine Culture     Status: Abnormal   Collection Time: 09/18/23  2:26 PM   Specimen: Urine, Random  Result Value Ref Range Status   Specimen Description   Final    URINE, RANDOM Performed at [facility], [address] [place], [place] [address]    Special Requests   Final    NONE Reflexed from [id number] Performed at [facility], [address] [place], [place] [address]    Culture (A)  Final    >=100,000 COLONIES/mL ESCHERICHIA COLI Confirmed Extended Spectrum Beta-Lactamase Producer (ESBL).  In bloodstream infections from ESBL organisms, carbapenems are preferred over piperacillin/tazobactam. They are shown to have a lower risk of mortality.    Report Status 09/20/2023 FINAL  Final   Organism ID, Bacteria ESCHERICHIA COLI (A)  Final      Susceptibility   Escherichia coli - MIC*    AMPICILLIN >=32 RESISTANT Resistant     CEFAZOLIN >=64 RESISTANT Resistant     CEFEPIME 16 RESISTANT Resistant     CEFTRIAXONE >=64 RESISTANT Resistant      CIPROFLOXACIN >=4 RESISTANT Resistant     GENTAMICIN >=16 RESISTANT Resistant     IMIPENEM <=0.25 SENSITIVE Sensitive     NITROFURANTOIN <=16 SENSITIVE Sensitive     TRIMETH/SULFA >=320 RESISTANT Resistant     AMPICILLIN/SULBACTAM >=32 RESISTANT Resistant     PIP/TAZO >=128 RESISTANT Resistant ug/mL    * >=100,000 COLONIES/mL ESCHERICHIA COLI  Culture, blood (routine x  2)     Status: Abnormal   Collection Time: 09/18/23  2:39 PM   Specimen: BLOOD LEFT FOREARM  Result Value Ref Range Status   Specimen Description   Final    BLOOD LEFT FOREARM Performed at [facility], [address] [place], [place] [address]    Special Requests   Final    BOTTLES DRAWN AEROBIC AND ANAEROBIC Blood Culture results may not be optimal due to an excessive volume of blood received in culture bottles Performed at [facility], [address], [place], [place] [address]    Culture  Setup Time   Final    GRAM NEGATIVE RODS IN BOTH AEROBIC AND ANAEROBIC BOTTLES CRITICAL RESULT CALLED TO, READ BACK BY AND VERIFIED WITH: PHARMD CHRISTINE SHADE ON 09/19/23 @ 1636 BY DRT    Culture (A)  Final    ESCHERICHIA COLI Confirmed Extended Spectrum Beta-Lactamase Producer (ESBL).  In bloodstream infections from ESBL organisms, carbapenems are preferred over piperacillin/tazobactam. They are shown to have a lower risk of mortality. STAPHYLOCOCCUS EPIDERMIDIS THE SIGNIFICANCE OF ISOLATING THIS ORGANISM FROM A SINGLE SET OF BLOOD CULTURES WHEN MULTIPLE SETS ARE DRAWN IS UNCERTAIN. PLEASE NOTIFY THE MICROBIOLOGY DEPARTMENT WITHIN ONE WEEK IF SPECIATION AND SENSITIVITIES ARE REQUIRED. Performed at [facility], [address] [place], [place] [address]    Report Status 09/22/2023 FINAL  Final   Organism ID, Bacteria ESCHERICHIA COLI  Final      Susceptibility   Escherichia coli - MIC*    AMPICILLIN >=32 RESISTANT Resistant     CEFEPIME >=32 RESISTANT Resistant     CEFTAZIDIME  >=64 RESISTANT Resistant     CEFTRIAXONE >=64 RESISTANT Resistant     CIPROFLOXACIN >=4 RESISTANT Resistant     GENTAMICIN >=16 RESISTANT Resistant     IMIPENEM <=0.25 SENSITIVE Sensitive     TRIMETH/SULFA >=320 RESISTANT Resistant     AMPICILLIN/SULBACTAM >=32 RESISTANT Resistant     PIP/TAZO >=128 RESISTANT Resistant ug/mL    * ESCHERICHIA COLI  Blood Culture ID Panel (Reflexed)     Status: Abnormal   Collection Time: 09/18/23  2:39 PM  Result Value Ref Range Status   Enterococcus faecalis NOT DETECTED NOT DETECTED Final   Enterococcus Faecium NOT DETECTED NOT DETECTED Final   Listeria monocytogenes NOT DETECTED NOT DETECTED Final   Staphylococcus species DETECTED (A) NOT DETECTED Final    Comment: CRITICAL RESULT CALLED TO, READ BACK BY AND VERIFIED WITH: PHARMD CHRISTINE SHADE ON 09/19/23 @ 1636 BY DRT    Staphylococcus aureus (BCID) NOT DETECTED NOT DETECTED Final   Staphylococcus epidermidis DETECTED (A) NOT DETECTED Final    Comment: CRITICAL RESULT CALLED TO, READ BACK BY AND VERIFIED WITH: PHARMD CHRISTINE SHADE ON 09/19/23 @ 1636 BY DRT    Staphylococcus lugdunensis NOT DETECTED NOT DETECTED Final   Streptococcus species NOT DETECTED NOT DETECTED Final   Streptococcus agalactiae NOT DETECTED NOT DETECTED Final   Streptococcus pneumoniae NOT DETECTED NOT DETECTED Final   Streptococcus pyogenes NOT DETECTED NOT DETECTED Final   A.calcoaceticus-baumannii NOT DETECTED NOT DETECTED Final   Bacteroides fragilis NOT DETECTED NOT DETECTED Final   Enterobacterales DETECTED (A) NOT DETECTED Final    Comment: Enterobacterales represent a large order of gram negative bacteria, not a single organism. CRITICAL RESULT CALLED TO, READ BACK BY AND VERIFIED WITH: PHARMD CHRISTINE SHADE ON 09/19/23 @ 1636 BY DRT    Enterobacter cloacae complex NOT DETECTED NOT DETECTED Final   Escherichia coli DETECTED (A) NOT  DETECTED Final    Comment: CRITICAL RESULT CALLED TO, READ BACK BY AND  VERIFIED WITH: PHARMD CHRISTINE SHADE ON 09/19/23 @ 1636 BY DRT    Klebsiella aerogenes NOT DETECTED NOT DETECTED Final   Klebsiella oxytoca NOT DETECTED NOT DETECTED Final   Klebsiella pneumoniae NOT DETECTED NOT DETECTED Final   Proteus species NOT DETECTED NOT DETECTED Final   Salmonella species NOT DETECTED NOT DETECTED Final   Serratia marcescens NOT DETECTED NOT DETECTED Final   Haemophilus influenzae NOT DETECTED NOT DETECTED Final   Neisseria meningitidis NOT DETECTED NOT DETECTED Final   Pseudomonas aeruginosa NOT DETECTED NOT DETECTED Final   Stenotrophomonas maltophilia NOT DETECTED NOT DETECTED Final   Candida albicans NOT DETECTED NOT DETECTED Final   Candida auris NOT DETECTED NOT DETECTED Final   Candida glabrata NOT DETECTED NOT DETECTED Final   Candida krusei NOT DETECTED NOT DETECTED Final   Candida parapsilosis NOT DETECTED NOT DETECTED Final   Candida tropicalis NOT DETECTED NOT DETECTED Final   Cryptococcus neoformans/gattii NOT DETECTED NOT DETECTED Final   CTX-M ESBL DETECTED (A) NOT DETECTED Final    Comment: CRITICAL RESULT CALLED TO, READ BACK BY AND VERIFIED WITH: PHARMD CHRISTINE SHADE ON 09/19/23 @ 1636 BY DRT (NOTE) Extended spectrum beta-lactamase detected. Recommend a carbapenem as initial therapy.      Carbapenem resistance IMP NOT DETECTED NOT DETECTED Final   Carbapenem resistance KPC NOT DETECTED NOT DETECTED Final   Methicillin resistance mecA/C NOT DETECTED NOT DETECTED Final   Carbapenem resistance NDM NOT DETECTED NOT DETECTED Final   Carbapenem resist OXA 48 LIKE NOT DETECTED NOT DETECTED Final   Carbapenem resistance VIM NOT DETECTED NOT DETECTED Final    Comment: Performed at [facility], [address], [place], [place] [address]  Culture, blood (routine x 2)     Status: Abnormal   Collection Time: 09/18/23  9:04 PM   Specimen: BLOOD LEFT HAND  Result Value Ref Range Status   Specimen Description   Final    BLOOD LEFT  HAND Performed at [facility], [address], [place], [place] [address]    Special Requests   Final    BOTTLES DRAWN AEROBIC AND ANAEROBIC Blood Culture adequate volume Performed at [facility], [address], [place], [place] [address]    Culture  Setup Time   Final    GRAM NEGATIVE RODS IN BOTH AEROBIC AND ANAEROBIC BOTTLES CRITICAL VALUE NOTED.  VALUE IS CONSISTENT WITH PREVIOUSLY REPORTED AND CALLED VALUE.    Culture (A)  Final    ESCHERICHIA COLI SUSCEPTIBILITIES PERFORMED ON PREVIOUS CULTURE WITHIN THE LAST 5 DAYS. Performed at [facility], [address], [place], [place] [address]    Report Status 09/22/2023 FINAL  Final  MRSA Next Gen by PCR, Nasal     Status: None   Collection Time: 09/19/23  1:09 AM   Specimen: Nasal Mucosa; Nasal Swab  Result Value Ref Range Status   MRSA by PCR Next Gen NOT DETECTED NOT DETECTED Final    Comment: (NOTE) The GeneXpert MRSA Assay (FDA approved for NASAL specimens only), is one component of a comprehensive MRSA colonization surveillance program. It is not intended to diagnose MRSA infection nor to guide or monitor treatment for MRSA infections. Test performance is not FDA approved in patients less than 52 years old. Performed at [facility], [address], [place], [place] [address]   C Difficile Quick Screen w PCR reflex  Status: None   Collection Time: 06-Oct-2023  8:59 AM   Specimen: STOOL  Result Value Ref Range Status   C Diff antigen NEGATIVE NEGATIVE Final   C Diff toxin NEGATIVE NEGATIVE Final   C Diff interpretation No C. difficile detected.  Final    Comment: Performed at [facility], [address] [place], [place] [address]    Procedures/Studies: EEG adult  Result Date: 10/06/2023 Charlsie Quest, MD     October 06, 2023  5:11 PM Patient Name: TYELOR PARRALES MRN: [id number] Epilepsy Attending: Charlsie Quest Referring Physician/Provider:  Rolly Salter, MD Date: 2023/10/06 Duration: 26.09 mins Patient history: 82yo M with ams getting eeg to evaluate for seizure Level of alertness: Awake AEDs during EEG study: VPA, Ativan Technical aspects: This EEG study was done with scalp electrodes positioned according to the 10-20 International system of electrode placement. Electrical activity was reviewed with band pass filter of 1-70Hz , sensitivity of 7 uV/mm, display speed of 32mm/sec with a 60Hz  notched filter applied as appropriate. EEG data were recorded continuously and digitally stored.  Video monitoring was available and reviewed as appropriate. Description: EEG showed continuous generalized 3 to 6 Hz theta-delta slowing. Generalized periodic discharges with triphasic morphology at 1 Hz were also noted. Hyperventilation and photic stimulation were not performed.   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 No seizures or definite epileptiform discharges were seen throughout the recording. Charlsie Quest   DG CHEST PORT 1 VIEW  Result Date: 09/19/2023 CLINICAL DATA:  Shortness of breath and altered EXAM: PORTABLE CHEST 1 VIEW COMPARISON:  09/18/2023 FINDINGS: Low lung volumes. Minimal atelectasis left base. No consolidation, pleural effusion or pneumothorax. Normal cardiac size. No pneumothorax IMPRESSION: Low lung volumes with minimal left base atelectasis. Electronically Signed   By: Jasmine Pang M.D.   On: 09/19/2023 22:16   DG Chest Portable 1 View  Result Date: 09/18/2023 CLINICAL DATA:  Dyspnea.  Altered mental status. EXAM: PORTABLE CHEST 1 VIEW COMPARISON:  08/13/2023. FINDINGS: Low lung volume. Bilateral lung fields are clear. Bilateral costophrenic angles are clear. Normal cardio-mediastinal silhouette. No acute osseous abnormalities. The soft  tissues are within normal limits. IMPRESSION: No active disease. Electronically Signed   By: Jules Schick M.D.   On: 09/18/2023 17:02   CT Head Wo Contrast  Result Date: 09/18/2023 CLINICAL DATA:  Urinary tract infection.  Head trauma. EXAM: CT HEAD WITHOUT CONTRAST TECHNIQUE: Contiguous axial images were obtained from the base of the skull through the vertex without intravenous contrast. RADIATION DOSE REDUCTION: This exam was performed according to the departmental dose-optimization program which includes automated exposure control, adjustment of the mA and/or kV according to patient size and/or use of iterative reconstruction technique. COMPARISON:  08/12/2023 FINDINGS: Brain: The study suffers from considerable motion degradation. 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 I do not think there is convincing evidence of intracranial hemorrhage. Detection hardware suggests possible small amount of subarachnoid hemorrhage on the right but I think this is probably due to motion degradation. Vascular: There is atherosclerotic calcification of the major vessels at the base of the brain. Skull: No skull fracture. Sinuses/Orbits: Clear/normal Other: None IMPRESSION: Motion degraded study. No definite acute finding. Atrophy and chronic small-vessel ischemic changes of  the white matter. Electronically Signed   By: Paulina Fusi M.D.   On: 09/18/2023 15:47     Time coordinating discharge: Over 30 minutes    Daniel Chamber, MD  [facility] 09/26/2023, 10:13 AM

## 2023-09-26 NOTE — Consult Note (Signed)
[facility]    09/26/2023  Daniel Sherman [facility] [birth year] [id number]   Primary Care Provider: Jerl Mina, MD is a provider associated with [facility],  this provider is listed for the transition of care follow up appointments.   Insurance plan:  [facility] Liaison screened the patient remotely at [facility].    The patient was screened for 7 day LOS readmission risk for  hospitalization with noted as extreme high risk score for unplanned readmission risk 3 hospital admissions in 6 months.  The patient was assessed for potential [facility]) Care Management service needs for post hospital transition for care coordination. Review of patient's electronic medical record reveals patient is from a Memory Care facility and now for hospice care noted per [facility]patient [facility] RNCM progress notes. Reviewed information in electronic medical record for any potential follow up needs and HCPOA agent notes in ACP.   Plan: . No referral request for community care coordination: If patient returns to [facility] with hospice then post hospital needs for returning to community is to be met at that level of care.  Patient to return to facility with [facility] noted.   Community Care Management/Population Health does not replace or interfere with any arrangements made by the Inpatient Transition of Care team.   For questions contact:   Charlesetta Shanks, RN, BSN, CCM [place]  [facility], [facility] Liaison Direct Dial: [phone] or secure chat Website: [email or web address]

## 2023-09-26 NOTE — TOC Transition Note (Signed)
Transition of Care [facility]) - CM/SW Discharge Note   Patient Details  Name: Daniel Sherman MRN: [id number] Date of Birth: Sep 11, 1941  Transition [facility] Care [facility]) CM/SW Contact:  Lanier Clam, RN Phone Number: 09/26/2023, 12:01 PM   Clinical Narrative:d/c back to [facility] memory care9emailed d/c summary w/confirmation to Ophelia Shoulder has provided onsite reviewer to hospital-they agree for return with [facility] rep aware. DNR. PTAR called. Going to [facility] care w/[facility]-hospice to follow- rm#11,report tel#[phone]. No further CM needs.      Final next level of care: Memory Care Barriers to Discharge: No Barriers Identified   Patient Goals and CMS Choice CMS Medicare.gov Compare Post Acute Care list provided to:: Patient Represenative (must comment) Choice offered to / list presented to : Adult Children  Discharge Placement                Patient chooses bed at: Other - please specify in the comment section below: ([facility] memory [facility]) Patient to be transferred to facility by: PTAR Name of family member notified: Shane Weintraub(Son) Patient and family notified of of transfer: 09/26/23  Discharge Plan and Services Additional resources added to the After Visit Summary for   In-house Referral: NA Discharge Planning Services: NA Post Acute Care Choice:  (memory care)            DME Agency: NA       HH Arranged:  (NA)          Social Determinants of Health (SDOH) Interventions SDOH Screenings   Food Insecurity: Patient Unable To Answer (09/18/2023)  Housing: Patient Unable To Answer (09/18/2023)  Transportation Needs: Patient Unable To Answer (09/18/2023)  Utilities: Patient Unable To Answer (09/18/2023)  Alcohol Screen: Low Risk  (04/19/2023)  Depression (PHQ2-9): Low Risk  (04/19/2023)  Financial Resource Strain: Medium Risk (04/19/2023)  Physical Activity: Inactive (04/19/2023)  Social Connections: Unknown (08/03/2023)   Received from  [facility]  Stress: Stress Concern Present (04/19/2023)  Tobacco Use: Medium Risk (09/18/2023)     Readmission Risk Interventions    09/24/2023   11:27 AM 08/14/2023    1:14 PM 11/14/2022   12:51 PM  Readmission Risk Prevention Plan  Transportation Screening Complete Complete Complete  PCP or Specialist Appt within 3-5 Days   Complete  HRI or Home Care Consult   Complete  Social Work Consult for Recovery Care Planning/Counseling   Complete  Palliative Care Screening   Not Applicable  Medication Review [facility]) Complete  Referral to Pharmacy  PCP or Specialist appointment within 3-5 days of discharge Complete Complete   HRI or Home Care Consult Complete Complete   SW Recovery Care/Counseling Consult Complete Complete   Palliative Care Screening Complete Not Applicable   Skilled Nursing Facility Not Applicable Not Applicable

## 2023-09-26 NOTE — TOC Progression Note (Signed)
Transition of Care [facility]) - Progression Note    Patient Details  Name: Daniel Sherman MRN: [id number] Date of Birth: Mar 05, 1941  [facility] CM/SW Contact  Shady Bradish, Olegario Messier, RN Phone Number: 09/26/2023, 10:43 AM  Clinical Narrative:  Awaiting return call from [facility] either LaToya rep/ or Rebecca(exec Dir) to f/u if able to accept back based on onsite review. D/c plan return memory care w/[facility] hospice services. DNR-awaiting outcome.     Expected Discharge Plan: Memory Care Barriers to Discharge: Continued Medical Work up  Expected Discharge Plan and Services In-house Referral: NA Discharge Planning Services: NA   Living arrangements for the past 2 months: Skilled Nursing Facility [facility]) Expected Discharge Date: 09/26/23                 DME Agency: NA       HH Arranged:  (NA)           Social Determinants of Health (SDOH) Interventions SDOH Screenings   Food Insecurity: Patient Unable To Answer (09/18/2023)  Housing: Patient Unable To Answer (09/18/2023)  Transportation Needs: Patient Unable To Answer (09/18/2023)  Utilities: Patient Unable To Answer (09/18/2023)  Alcohol Screen: Low Risk  (04/19/2023)  Depression (PHQ2-9): Low Risk  (04/19/2023)  Financial Resource Strain: Medium Risk (04/19/2023)  Physical Activity: Inactive (04/19/2023)  Social Connections: Unknown (08/03/2023)   Received from [facility]  Stress: Stress Concern Present (04/19/2023)  Tobacco Use: Medium Risk (09/18/2023)    Readmission Risk Interventions    09/24/2023   11:27 AM 08/14/2023    1:14 PM 11/14/2022   12:51 PM  Readmission Risk Prevention Plan  Transportation Screening Complete Complete Complete  PCP or Specialist Appt within 3-5 Days   Complete  HRI or Home Care Consult   Complete  Social Work Consult for Recovery Care Planning/Counseling   Complete  Palliative Care Screening   Not Applicable  Medication Review Oceanographer) Complete   Referral to Pharmacy  PCP or Specialist appointment within 3-5 days of discharge Complete Complete   HRI or Home Care Consult Complete Complete   SW Recovery Care/Counseling Consult Complete Complete   Palliative Care Screening Complete Not Applicable   Skilled Nursing Facility Not Applicable Not Applicable

## 2023-09-26 NOTE — Plan of Care (Signed)
Maintains adequate gas exchange on room air; no acute distress noted, no shortness of breath. Tolerating diet; assist with meals. Reinforce all teaching with patient due to confusion; no evidence of learning. Continue current plan of care.

## 2023-10-02 ENCOUNTER — Emergency Department (HOSPITAL_COMMUNITY)
Admission: EM | Admit: 2023-10-02 | Discharge: 2023-10-03 | Disposition: A | Discharge status: Home / Self Care | Payer: Medicare HMO | Attending: Emergency Medicine | Admitting: Emergency Medicine

## 2023-10-02 ENCOUNTER — Emergency Department (HOSPITAL_COMMUNITY): Payer: Medicare HMO

## 2023-10-02 ENCOUNTER — Other Ambulatory Visit: Payer: Self-pay

## 2023-10-02 ENCOUNTER — Emergency Department (HOSPITAL_BASED_OUTPATIENT_CLINIC_OR_DEPARTMENT_OTHER): Payer: Medicare HMO

## 2023-10-02 ENCOUNTER — Encounter (HOSPITAL_COMMUNITY): Payer: Self-pay

## 2023-10-02 DIAGNOSIS — R451 Restlessness and agitation: Secondary | ICD-10-CM | POA: Diagnosis not present

## 2023-10-02 DIAGNOSIS — Z515 Encounter for palliative care: Secondary | ICD-10-CM | POA: Diagnosis not present

## 2023-10-02 DIAGNOSIS — G309 Alzheimer's disease, unspecified: Secondary | ICD-10-CM | POA: Diagnosis not present

## 2023-10-02 DIAGNOSIS — I10 Essential (primary) hypertension: Secondary | ICD-10-CM | POA: Diagnosis not present

## 2023-10-02 DIAGNOSIS — I2510 Atherosclerotic heart disease of native coronary artery without angina pectoris: Secondary | ICD-10-CM | POA: Diagnosis not present

## 2023-10-02 DIAGNOSIS — F03918 Unspecified dementia, unspecified severity, with other behavioral disturbance: Secondary | ICD-10-CM

## 2023-10-02 DIAGNOSIS — Z79899 Other long term (current) drug therapy: Secondary | ICD-10-CM | POA: Diagnosis not present

## 2023-10-02 DIAGNOSIS — N390 Urinary tract infection, site not specified: Secondary | ICD-10-CM | POA: Diagnosis not present

## 2023-10-02 DIAGNOSIS — E119 Type 2 diabetes mellitus without complications: Secondary | ICD-10-CM | POA: Diagnosis not present

## 2023-10-02 DIAGNOSIS — R531 Weakness: Secondary | ICD-10-CM | POA: Diagnosis not present

## 2023-10-02 DIAGNOSIS — M7989 Other specified soft tissue disorders: Secondary | ICD-10-CM

## 2023-10-02 DIAGNOSIS — F0390 Unspecified dementia without behavioral disturbance: Secondary | ICD-10-CM | POA: Diagnosis not present

## 2023-10-02 DIAGNOSIS — R4182 Altered mental status, unspecified: Secondary | ICD-10-CM | POA: Diagnosis not present

## 2023-10-02 LAB — CBC WITH DIFFERENTIAL/PLATELET
Abs Immature Granulocytes: 0.01 10*3/uL (ref 0.00–0.07)
Basophils Absolute: 0 10*3/uL (ref 0.0–0.1)
Basophils Relative: 0 %
Eosinophils Absolute: 0.1 10*3/uL (ref 0.0–0.5)
Eosinophils Relative: 2 %
HCT: 30.2 % — ABNORMAL LOW (ref 39.0–52.0)
Hemoglobin: 9.4 g/dL — ABNORMAL LOW (ref 13.0–17.0)
Immature Granulocytes: 0 %
Lymphocytes Relative: 19 %
Lymphs Abs: 0.9 10*3/uL (ref 0.7–4.0)
MCH: 27.6 pg (ref 26.0–34.0)
MCHC: 31.1 g/dL (ref 30.0–36.0)
MCV: 88.8 fL (ref 80.0–100.0)
Monocytes Absolute: 0.3 10*3/uL (ref 0.1–1.0)
Monocytes Relative: 7 %
Neutro Abs: 3.6 10*3/uL (ref 1.7–7.7)
Neutrophils Relative %: 72 %
Platelets: 129 10*3/uL — ABNORMAL LOW (ref 150–400)
RBC: 3.4 MIL/uL — ABNORMAL LOW (ref 4.22–5.81)
RDW: 14.4 % (ref 11.5–15.5)
WBC: 4.9 10*3/uL (ref 4.0–10.5)
nRBC: 0 % (ref 0.0–0.2)

## 2023-10-02 LAB — COMPREHENSIVE METABOLIC PANEL
ALT: 12 U/L (ref 0–44)
AST: 11 U/L — ABNORMAL LOW (ref 15–41)
Albumin: 3 g/dL — ABNORMAL LOW (ref 3.5–5.0)
Alkaline Phosphatase: 79 U/L (ref 38–126)
Anion gap: 7 (ref 5–15)
BUN: 29 mg/dL — ABNORMAL HIGH (ref 8–23)
CO2: 26 mmol/L (ref 22–32)
Calcium: 8.3 mg/dL — ABNORMAL LOW (ref 8.9–10.3)
Chloride: 106 mmol/L (ref 98–111)
Creatinine, Ser: 1.73 mg/dL — ABNORMAL HIGH (ref 0.61–1.24)
GFR, Estimated: 39 mL/min — ABNORMAL LOW (ref >60)
Glucose, Bld: 122 mg/dL — ABNORMAL HIGH (ref 70–99)
Potassium: 5.2 mmol/L — ABNORMAL HIGH (ref 3.5–5.1)
Sodium: 139 mmol/L (ref 135–145)
Total Bilirubin: 0.8 mg/dL (ref 0.3–1.2)
Total Protein: 6.1 g/dL — ABNORMAL LOW (ref 6.5–8.1)

## 2023-10-02 LAB — URINALYSIS, ROUTINE W REFLEX MICROSCOPIC
Bilirubin Urine: NEGATIVE
Glucose, UA: NEGATIVE mg/dL
Hgb urine dipstick: NEGATIVE
Ketones, ur: NEGATIVE mg/dL
Leukocytes,Ua: NEGATIVE
Nitrite: NEGATIVE
Protein, ur: NEGATIVE mg/dL
Specific Gravity, Urine: 1.014 (ref 1.005–1.030)
pH: 5 (ref 5.0–8.0)

## 2023-10-02 LAB — VALPROIC ACID LEVEL: Valproic Acid Lvl: 26 ug/mL — ABNORMAL LOW (ref 50.0–100.0)

## 2023-10-02 MED ORDER — SODIUM CHLORIDE 0.9 % IV BOLUS
500.0000 mL | Freq: Once | INTRAVENOUS | Status: AC
  Administered 2023-10-02: 500 mL via INTRAVENOUS
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
## 2023-10-03 DIAGNOSIS — Z7401 Bed confinement status: Secondary | ICD-10-CM | POA: Diagnosis not present

## 2023-10-03 DIAGNOSIS — G309 Alzheimer's disease, unspecified: Secondary | ICD-10-CM | POA: Diagnosis not present

## 2023-10-03 DIAGNOSIS — R0902 Hypoxemia: Secondary | ICD-10-CM | POA: Diagnosis not present

## 2023-10-03 DIAGNOSIS — F419 Anxiety disorder, unspecified: Secondary | ICD-10-CM | POA: Diagnosis not present

## 2023-10-03 DIAGNOSIS — R451 Restlessness and agitation: Secondary | ICD-10-CM | POA: Diagnosis not present

## 2023-10-03 DIAGNOSIS — F02C18 Dementia in other diseases classified elsewhere, severe, with other behavioral disturbance: Secondary | ICD-10-CM | POA: Diagnosis not present

## 2023-10-04 DIAGNOSIS — Z515 Encounter for palliative care: Secondary | ICD-10-CM | POA: Diagnosis not present

## 2023-10-04 DIAGNOSIS — R451 Restlessness and agitation: Secondary | ICD-10-CM | POA: Diagnosis not present

## 2023-10-04 DIAGNOSIS — G309 Alzheimer's disease, unspecified: Secondary | ICD-10-CM | POA: Diagnosis not present

## 2023-10-07 ENCOUNTER — Encounter (HOSPITAL_COMMUNITY): Payer: Self-pay

## 2023-10-07 ENCOUNTER — Other Ambulatory Visit: Payer: Self-pay

## 2023-10-07 ENCOUNTER — Emergency Department (HOSPITAL_COMMUNITY)
Admission: EM | Admit: 2023-10-07 | Discharge: 2023-10-08 | Disposition: A | Discharge status: Hospice | Payer: Medicare HMO | Attending: Emergency Medicine | Admitting: Emergency Medicine

## 2023-10-07 ENCOUNTER — Emergency Department (HOSPITAL_COMMUNITY): Payer: Medicare HMO

## 2023-10-07 DIAGNOSIS — R456 Violent behavior: Secondary | ICD-10-CM | POA: Diagnosis not present

## 2023-10-07 DIAGNOSIS — Z79899 Other long term (current) drug therapy: Secondary | ICD-10-CM | POA: Diagnosis not present

## 2023-10-07 DIAGNOSIS — F0390 Unspecified dementia without behavioral disturbance: Secondary | ICD-10-CM | POA: Insufficient documentation

## 2023-10-07 DIAGNOSIS — F419 Anxiety disorder, unspecified: Secondary | ICD-10-CM | POA: Diagnosis not present

## 2023-10-07 DIAGNOSIS — R4182 Altered mental status, unspecified: Secondary | ICD-10-CM | POA: Insufficient documentation

## 2023-10-07 DIAGNOSIS — R4689 Other symptoms and signs involving appearance and behavior: Secondary | ICD-10-CM | POA: Insufficient documentation

## 2023-10-07 DIAGNOSIS — I10 Essential (primary) hypertension: Secondary | ICD-10-CM | POA: Diagnosis not present

## 2023-10-07 DIAGNOSIS — I700 Atherosclerosis of aorta: Secondary | ICD-10-CM | POA: Diagnosis not present

## 2023-10-07 DIAGNOSIS — N390 Urinary tract infection, site not specified: Secondary | ICD-10-CM | POA: Diagnosis not present

## 2023-10-07 DIAGNOSIS — F02C18 Dementia in other diseases classified elsewhere, severe, with other behavioral disturbance: Secondary | ICD-10-CM | POA: Diagnosis not present

## 2023-10-07 DIAGNOSIS — E119 Type 2 diabetes mellitus without complications: Secondary | ICD-10-CM | POA: Insufficient documentation

## 2023-10-07 DIAGNOSIS — G309 Alzheimer's disease, unspecified: Secondary | ICD-10-CM | POA: Diagnosis not present

## 2023-10-07 DIAGNOSIS — I959 Hypotension, unspecified: Secondary | ICD-10-CM | POA: Diagnosis not present

## 2023-10-07 LAB — I-STAT CG4 LACTIC ACID, ED: Lactic Acid, Venous: 2.9 mmol/L (ref 0.5–1.9)

## 2023-10-07 LAB — BASIC METABOLIC PANEL
Anion gap: 10 (ref 5–15)
BUN: 45 mg/dL — ABNORMAL HIGH (ref 8–23)
CO2: 24 mmol/L (ref 22–32)
Calcium: 8.6 mg/dL — ABNORMAL LOW (ref 8.9–10.3)
Chloride: 103 mmol/L (ref 98–111)
Creatinine, Ser: 1.92 mg/dL — ABNORMAL HIGH (ref 0.61–1.24)
GFR, Estimated: 34 mL/min — ABNORMAL LOW (ref >60)
Glucose, Bld: 142 mg/dL — ABNORMAL HIGH (ref 70–99)
Potassium: 5 mmol/L (ref 3.5–5.1)
Sodium: 137 mmol/L (ref 135–145)

## 2023-10-07 LAB — APTT: aPTT: 33 s (ref 24–36)

## 2023-10-07 MED ORDER — ESCITALOPRAM OXALATE 10 MG PO TABS: 5.0000 mg | ORAL_TABLET | Freq: Every day | ORAL | Status: DC

## 2023-10-07 MED ORDER — ACETAMINOPHEN 325 MG PO TABS: 650.0000 mg | ORAL_TABLET | ORAL | Status: DC | PRN

## 2023-10-07 MED ORDER — SODIUM CHLORIDE 0.9 % IV BOLUS: 1000.0000 mL | Freq: Once | INTRAVENOUS | Status: DC

## 2023-10-07 MED ORDER — LORAZEPAM 1 MG PO TABS
1.0000 mg | ORAL_TABLET | Freq: Every day | ORAL | Status: DC
  Administered 2023-10-07: 1 mg via ORAL
  Filled 2023-10-07: qty 1

## 2023-10-07 MED ORDER — ESCITALOPRAM OXALATE 10 MG PO TABS
10.0000 mg | ORAL_TABLET | Freq: Two times a day (BID) | ORAL | Status: DC
  Administered 2023-10-07: 10 mg via ORAL
  Filled 2023-10-07: qty 1

## 2023-10-07 MED ORDER — DIVALPROEX SODIUM 250 MG PO DR TAB: 250.0000 mg | DELAYED_RELEASE_TABLET | Freq: Three times a day (TID) | ORAL | Status: DC

## 2023-10-07 MED ORDER — OLANZAPINE 5 MG PO TBDP
5.0000 mg | ORAL_TABLET | Freq: Every day | ORAL | Status: DC
  Administered 2023-10-07: 5 mg via ORAL
  Filled 2023-10-07: qty 1

## 2023-10-07 MED ORDER — DONEPEZIL HCL 5 MG PO TABS
10.0000 mg | ORAL_TABLET | Freq: Every day | ORAL | Status: DC
  Administered 2023-10-07: 10 mg via ORAL
  Filled 2023-10-07: qty 2
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
## 2023-10-07 NOTE — ED Notes (Signed)
Spoke with Dr. Renaye Rakers who spoke with the patients son. Plan for patient to go to hospice. Hold any further interventions. Explained to Dr. Renaye Rakers that patient BP is low. Per Dr. Renaye Rakers okay that BP is low.

## 2023-10-07 NOTE — ED Notes (Signed)
Pt given sandwich and ginger ale.

## 2023-10-08 NOTE — Progress Notes (Signed)
TOC consulted for Hopsice. CSW spoke with Daniel Sherman, [facility] rep, who reports transport has been arranged and there is nothing additional needed at this time. RN and EDP aware. No further TOC needs.

## 2023-10-08 NOTE — Progress Notes (Signed)
[facility] liaison note     This patient is a current hospice patient with [facility], admitted with a terminal diagnosis of Alzheimer's Disease.     Current plan is for patient to transfer over to the hospice home in [place] for continued symptom management. Transport will be arranged for this morning.   Report can be called to [phone]  Please don't hesitate to call with any Hospice related questions or concerns.    Thank you for the opportunity to participate in this patient's care. Thea Gist, Charity fundraiser, BSN [facility] Liaison [phone]

## 2023-10-08 NOTE — ED Provider Notes (Signed)
Emergency Medicine Observation Re-evaluation Note  Daniel Sherman is a 82 y.o. male, seen on rounds today.  Pt initially presented to the ED for complaints of Hypotension, Altered Mental Status, and Psychiatric Evaluation Currently, the patient is sleeping.  Physical Exam  BP (!) 122/57   Pulse 74   Temp 98.6 F (37 C)   Resp 17   SpO2 90%  Physical Exam General: No distress Cardiac: Well-perfused Lungs: No respiratory distress Psych: Calm  ED Course / MDM  EKG:   I have reviewed the labs performed to date as well as medications administered while in observation.  Recent changes in the last 24 hours include transition to hospice today.  Plan  Current plan is for transition to hospice today.Glynn Octave, MD 10/08/23 [phone]

## 2023-10-17 ENCOUNTER — Ambulatory Visit: Payer: Medicare HMO | Admitting: Podiatry

## 2023-11-03 DEATH — deceased
